# Patient Record
Sex: Male | Born: 1940 | Race: White | Hispanic: No | Marital: Married | Smoking: Never smoker
Health system: Southern US, Community
[De-identification: ages and names within clinical notes are randomized; demographics above are authoritative.]

## PROBLEM LIST (undated history)

## (undated) DIAGNOSIS — I2510 Atherosclerotic heart disease of native coronary artery without angina pectoris: Secondary | ICD-10-CM

## (undated) DIAGNOSIS — I499 Cardiac arrhythmia, unspecified: Secondary | ICD-10-CM

## (undated) DIAGNOSIS — J986 Disorders of diaphragm: Secondary | ICD-10-CM

## (undated) DIAGNOSIS — E328 Other diseases of thymus: Secondary | ICD-10-CM

## (undated) DIAGNOSIS — I4891 Unspecified atrial fibrillation: Secondary | ICD-10-CM

## (undated) DIAGNOSIS — G709 Myoneural disorder, unspecified: Secondary | ICD-10-CM

## (undated) DIAGNOSIS — C61 Malignant neoplasm of prostate: Secondary | ICD-10-CM

## (undated) DIAGNOSIS — N5231 Erectile dysfunction following radical prostatectomy: Secondary | ICD-10-CM

## (undated) DIAGNOSIS — K5790 Diverticulosis of intestine, part unspecified, without perforation or abscess without bleeding: Secondary | ICD-10-CM

## (undated) DIAGNOSIS — IMO0001 Reserved for inherently not codable concepts without codable children: Secondary | ICD-10-CM

## (undated) DIAGNOSIS — K627 Radiation proctitis: Secondary | ICD-10-CM

## (undated) DIAGNOSIS — K635 Polyp of colon: Secondary | ICD-10-CM

## (undated) DIAGNOSIS — M1990 Unspecified osteoarthritis, unspecified site: Secondary | ICD-10-CM

## (undated) DIAGNOSIS — J9612 Chronic respiratory failure with hypercapnia: Secondary | ICD-10-CM

## (undated) DIAGNOSIS — J9859 Other diseases of mediastinum, not elsewhere classified: Secondary | ICD-10-CM

## (undated) DIAGNOSIS — J3800 Paralysis of vocal cords and larynx, unspecified: Secondary | ICD-10-CM

## (undated) DIAGNOSIS — D696 Thrombocytopenia, unspecified: Secondary | ICD-10-CM

## (undated) DIAGNOSIS — IMO0002 Reserved for concepts with insufficient information to code with codable children: Secondary | ICD-10-CM

## (undated) DIAGNOSIS — R0600 Dyspnea, unspecified: Secondary | ICD-10-CM

## (undated) DIAGNOSIS — J9690 Respiratory failure, unspecified, unspecified whether with hypoxia or hypercapnia: Secondary | ICD-10-CM

## (undated) HISTORY — PX: ATRIAL ABLATION SURGERY: SHX560

## (undated) HISTORY — PX: OTHER SURGICAL HISTORY: SHX169

## (undated) HISTORY — DX: Unspecified atrial fibrillation: I48.91

## (undated) HISTORY — PX: POLYPECTOMY: SHX149

## (undated) HISTORY — DX: Radiation proctitis: K62.7

## (undated) HISTORY — DX: Other diseases of mediastinum, not elsewhere classified: J98.59

## (undated) HISTORY — PX: TONSILLECTOMY: SUR1361

## (undated) HISTORY — PX: BACK SURGERY: SHX140

## (undated) HISTORY — DX: Thrombocytopenia, unspecified: D69.6

## (undated) HISTORY — DX: Respiratory failure, unspecified, unspecified whether with hypoxia or hypercapnia: J96.90

## (undated) HISTORY — DX: Unspecified osteoarthritis, unspecified site: M19.90

## (undated) HISTORY — DX: Reserved for concepts with insufficient information to code with codable children: IMO0002

## (undated) HISTORY — DX: Other diseases of thymus: E32.8

## (undated) HISTORY — PX: COLONOSCOPY: SHX174

## (undated) HISTORY — PX: VASECTOMY: SHX75

## (undated) HISTORY — DX: Malignant neoplasm of prostate: C61

## (undated) HISTORY — PX: KNEE ARTHROSCOPY: SUR90

## (undated) HISTORY — PX: PROSTATE SURGERY: SHX751

## (undated) HISTORY — DX: Diverticulosis of intestine, part unspecified, without perforation or abscess without bleeding: K57.90

## (undated) HISTORY — DX: Paralysis of vocal cords and larynx, unspecified: J38.00

## (undated) HISTORY — DX: Myoneural disorder, unspecified: G70.9

## (undated) HISTORY — DX: Disorders of diaphragm: J98.6

## (undated) HISTORY — DX: Polyp of colon: K63.5

## (undated) HISTORY — DX: Chronic respiratory failure with hypercapnia: J96.12

## (undated) HISTORY — DX: Atherosclerotic heart disease of native coronary artery without angina pectoris: I25.10

## (undated) HISTORY — DX: Reserved for inherently not codable concepts without codable children: IMO0001

## (undated) HISTORY — DX: Erectile dysfunction following radical prostatectomy: N52.31

---

## 2001-09-27 ENCOUNTER — Encounter: Payer: Self-pay | Admitting: Orthopedic Surgery

## 2001-09-27 ENCOUNTER — Encounter: Admission: RE | Admit: 2001-09-27 | Discharge: 2001-09-27 | Payer: Self-pay | Admitting: Orthopedic Surgery

## 2001-10-11 ENCOUNTER — Encounter: Payer: Self-pay | Admitting: Orthopedic Surgery

## 2001-10-11 ENCOUNTER — Encounter: Admission: RE | Admit: 2001-10-11 | Discharge: 2001-10-11 | Payer: Self-pay | Admitting: Orthopedic Surgery

## 2001-10-25 ENCOUNTER — Encounter: Admission: RE | Admit: 2001-10-25 | Discharge: 2001-10-25 | Payer: Self-pay | Admitting: Orthopedic Surgery

## 2001-10-25 ENCOUNTER — Encounter: Payer: Self-pay | Admitting: Orthopedic Surgery

## 2001-11-12 ENCOUNTER — Encounter: Admission: RE | Admit: 2001-11-12 | Discharge: 2001-11-12 | Payer: Self-pay | Admitting: Orthopedic Surgery

## 2001-11-12 ENCOUNTER — Encounter: Payer: Self-pay | Admitting: Orthopedic Surgery

## 2002-04-17 ENCOUNTER — Encounter: Payer: Self-pay | Admitting: Orthopedic Surgery

## 2002-04-17 ENCOUNTER — Encounter: Admission: RE | Admit: 2002-04-17 | Discharge: 2002-04-17 | Payer: Self-pay | Admitting: Orthopedic Surgery

## 2002-06-20 ENCOUNTER — Encounter: Admission: RE | Admit: 2002-06-20 | Discharge: 2002-06-20 | Payer: Self-pay | Admitting: Orthopedic Surgery

## 2002-06-20 ENCOUNTER — Encounter: Payer: Self-pay | Admitting: Orthopedic Surgery

## 2002-07-16 ENCOUNTER — Encounter: Admission: RE | Admit: 2002-07-16 | Discharge: 2002-07-16 | Payer: Self-pay | Admitting: Orthopedic Surgery

## 2002-07-16 ENCOUNTER — Encounter: Payer: Self-pay | Admitting: Orthopedic Surgery

## 2002-09-10 ENCOUNTER — Ambulatory Visit (HOSPITAL_BASED_OUTPATIENT_CLINIC_OR_DEPARTMENT_OTHER): Admission: RE | Admit: 2002-09-10 | Discharge: 2002-09-10 | Payer: Self-pay | Admitting: Orthopaedic Surgery

## 2003-05-28 ENCOUNTER — Encounter: Payer: Self-pay | Admitting: Orthopedic Surgery

## 2003-05-28 ENCOUNTER — Encounter: Admission: RE | Admit: 2003-05-28 | Discharge: 2003-05-28 | Payer: Self-pay | Admitting: Orthopedic Surgery

## 2003-07-02 ENCOUNTER — Encounter: Payer: Self-pay | Admitting: Orthopedic Surgery

## 2003-07-02 ENCOUNTER — Encounter: Admission: RE | Admit: 2003-07-02 | Discharge: 2003-07-02 | Payer: Self-pay | Admitting: Orthopedic Surgery

## 2003-07-29 ENCOUNTER — Encounter: Admission: RE | Admit: 2003-07-29 | Discharge: 2003-07-29 | Payer: Self-pay | Admitting: Orthopedic Surgery

## 2004-02-05 ENCOUNTER — Encounter: Admission: RE | Admit: 2004-02-05 | Discharge: 2004-02-05 | Payer: Self-pay | Admitting: Neurosurgery

## 2004-05-10 ENCOUNTER — Inpatient Hospital Stay (HOSPITAL_COMMUNITY): Admission: AD | Admit: 2004-05-10 | Discharge: 2004-05-11 | Payer: Self-pay | Admitting: *Deleted

## 2004-05-11 ENCOUNTER — Encounter: Payer: Self-pay | Admitting: Cardiology

## 2004-08-17 ENCOUNTER — Ambulatory Visit: Payer: Self-pay | Admitting: *Deleted

## 2004-08-20 ENCOUNTER — Ambulatory Visit: Payer: Self-pay

## 2004-08-27 ENCOUNTER — Ambulatory Visit (HOSPITAL_COMMUNITY): Admission: RE | Admit: 2004-08-27 | Discharge: 2004-08-27 | Payer: Self-pay | Admitting: Neurosurgery

## 2005-03-02 ENCOUNTER — Ambulatory Visit: Payer: Self-pay | Admitting: *Deleted

## 2005-04-08 ENCOUNTER — Ambulatory Visit: Payer: Self-pay | Admitting: Internal Medicine

## 2005-07-09 ENCOUNTER — Inpatient Hospital Stay (HOSPITAL_COMMUNITY): Admission: AD | Admit: 2005-07-09 | Discharge: 2005-07-13 | Payer: Self-pay | Admitting: Urology

## 2005-07-13 ENCOUNTER — Ambulatory Visit: Payer: Self-pay | Admitting: Cardiology

## 2005-08-10 ENCOUNTER — Ambulatory Visit: Payer: Self-pay | Admitting: Internal Medicine

## 2005-09-27 ENCOUNTER — Encounter (INDEPENDENT_AMBULATORY_CARE_PROVIDER_SITE_OTHER): Payer: Self-pay | Admitting: Specialist

## 2005-09-27 ENCOUNTER — Inpatient Hospital Stay (HOSPITAL_COMMUNITY): Admission: RE | Admit: 2005-09-27 | Discharge: 2005-09-29 | Payer: Self-pay | Admitting: Urology

## 2006-05-12 ENCOUNTER — Ambulatory Visit: Payer: Self-pay | Admitting: Internal Medicine

## 2006-05-16 ENCOUNTER — Ambulatory Visit: Payer: Self-pay | Admitting: Cardiovascular Disease

## 2006-05-30 ENCOUNTER — Ambulatory Visit: Payer: Self-pay | Admitting: Cardiology

## 2006-06-13 ENCOUNTER — Ambulatory Visit: Payer: Self-pay | Admitting: *Deleted

## 2006-07-13 ENCOUNTER — Ambulatory Visit: Payer: Self-pay | Admitting: *Deleted

## 2006-07-13 ENCOUNTER — Ambulatory Visit: Payer: Self-pay | Admitting: Internal Medicine

## 2006-08-07 ENCOUNTER — Ambulatory Visit: Payer: Self-pay | Admitting: Internal Medicine

## 2006-08-07 LAB — CONVERTED CEMR LAB
BUN: 18 mg/dL (ref 6–23)
CO2: 30 meq/L (ref 19–32)
Calcium: 9.6 mg/dL (ref 8.4–10.5)
Chloride: 106 meq/L (ref 96–112)
Creatinine, Ser: 1 mg/dL (ref 0.4–1.5)
GFR calc non Af Amer: 80 mL/min
Glomerular Filtration Rate, Af Am: 97 mL/min/{1.73_m2}
Glucose, Bld: 88 mg/dL (ref 70–99)
HCT: 43.7 % (ref 39.0–52.0)
Hemoglobin: 14.8 g/dL (ref 13.0–17.0)
INR: 1.9 (ref 0.9–2.0)
MCHC: 33.8 g/dL (ref 30.0–36.0)
MCV: 90.2 fL (ref 78.0–100.0)
Platelets: 177 10*3/uL (ref 150–400)
Potassium: 4.4 meq/L (ref 3.5–5.1)
Prothrombin Time: 17.2 s — ABNORMAL HIGH (ref 10.0–14.0)
RBC: 4.85 M/uL (ref 4.22–5.81)
RDW: 12.9 % (ref 11.5–14.6)
Sodium: 142 meq/L (ref 135–145)
WBC: 6.6 10*3/uL (ref 4.5–10.5)
aPTT: 38.4 s — ABNORMAL HIGH (ref 26.5–36.5)

## 2006-08-10 ENCOUNTER — Ambulatory Visit: Payer: Self-pay | Admitting: Internal Medicine

## 2006-08-17 ENCOUNTER — Ambulatory Visit: Payer: Self-pay | Admitting: Cardiovascular Disease

## 2006-08-25 ENCOUNTER — Ambulatory Visit: Payer: Self-pay | Admitting: Internal Medicine

## 2006-08-31 ENCOUNTER — Ambulatory Visit: Payer: Self-pay | Admitting: Cardiology

## 2006-09-08 ENCOUNTER — Ambulatory Visit (HOSPITAL_COMMUNITY): Admission: RE | Admit: 2006-09-08 | Discharge: 2006-09-08 | Payer: Self-pay | Admitting: Internal Medicine

## 2006-09-08 ENCOUNTER — Ambulatory Visit: Payer: Self-pay | Admitting: Internal Medicine

## 2006-09-14 ENCOUNTER — Ambulatory Visit: Payer: Self-pay | Admitting: Internal Medicine

## 2006-09-14 ENCOUNTER — Ambulatory Visit (HOSPITAL_COMMUNITY): Admission: RE | Admit: 2006-09-14 | Discharge: 2006-09-14 | Payer: Self-pay | Admitting: Internal Medicine

## 2006-09-15 ENCOUNTER — Ambulatory Visit: Payer: Self-pay | Admitting: Internal Medicine

## 2006-09-15 ENCOUNTER — Ambulatory Visit: Payer: Self-pay

## 2006-10-02 ENCOUNTER — Ambulatory Visit: Payer: Self-pay | Admitting: Cardiology

## 2006-10-06 ENCOUNTER — Ambulatory Visit: Payer: Self-pay | Admitting: Cardiology

## 2006-10-06 ENCOUNTER — Ambulatory Visit (HOSPITAL_COMMUNITY): Admission: RE | Admit: 2006-10-06 | Discharge: 2006-10-06 | Payer: Self-pay | Admitting: Internal Medicine

## 2006-10-12 ENCOUNTER — Ambulatory Visit: Payer: Self-pay | Admitting: Cardiology

## 2006-10-12 ENCOUNTER — Ambulatory Visit: Payer: Self-pay

## 2006-10-27 ENCOUNTER — Ambulatory Visit: Payer: Self-pay | Admitting: Cardiology

## 2006-11-10 ENCOUNTER — Ambulatory Visit: Payer: Self-pay | Admitting: Internal Medicine

## 2006-11-28 ENCOUNTER — Ambulatory Visit: Payer: Self-pay | Admitting: Cardiology

## 2006-12-13 ENCOUNTER — Ambulatory Visit: Payer: Self-pay | Admitting: Cardiovascular Disease

## 2007-02-07 ENCOUNTER — Ambulatory Visit: Payer: Self-pay | Admitting: Cardiology

## 2007-02-26 ENCOUNTER — Ambulatory Visit: Payer: Self-pay | Admitting: Cardiology

## 2007-02-26 LAB — CONVERTED CEMR LAB
INR: 1.7 (ref 0.9–2.0)
Prothrombin Time: 16 s — ABNORMAL HIGH (ref 10.0–14.0)

## 2007-03-02 ENCOUNTER — Ambulatory Visit: Payer: Self-pay | Admitting: Cardiology

## 2007-03-22 ENCOUNTER — Ambulatory Visit: Payer: Self-pay | Admitting: Internal Medicine

## 2007-04-20 ENCOUNTER — Ambulatory Visit: Payer: Self-pay | Admitting: Cardiology

## 2007-05-04 ENCOUNTER — Ambulatory Visit: Payer: Self-pay | Admitting: Cardiology

## 2007-05-17 ENCOUNTER — Ambulatory Visit: Payer: Self-pay | Admitting: Internal Medicine

## 2007-05-18 ENCOUNTER — Ambulatory Visit: Payer: Self-pay | Admitting: Cardiothoracic Surgery

## 2007-06-08 ENCOUNTER — Ambulatory Visit: Payer: Self-pay | Admitting: Cardiology

## 2007-07-11 ENCOUNTER — Ambulatory Visit: Payer: Self-pay | Admitting: Cardiology

## 2007-08-17 ENCOUNTER — Ambulatory Visit: Payer: Self-pay | Admitting: Cardiology

## 2007-09-13 HISTORY — PX: ROBOT ASSISTED LAPAROSCOPIC RADICAL PROSTATECTOMY: SHX5141

## 2007-09-14 ENCOUNTER — Ambulatory Visit: Payer: Self-pay | Admitting: Cardiovascular Disease

## 2007-10-12 ENCOUNTER — Ambulatory Visit: Payer: Self-pay | Admitting: Cardiology

## 2008-10-17 ENCOUNTER — Ambulatory Visit: Admission: RE | Admit: 2008-10-17 | Discharge: 2008-11-07 | Payer: Self-pay | Admitting: Radiation Oncology

## 2009-06-04 ENCOUNTER — Encounter (INDEPENDENT_AMBULATORY_CARE_PROVIDER_SITE_OTHER): Payer: Self-pay | Admitting: *Deleted

## 2009-09-24 ENCOUNTER — Encounter (INDEPENDENT_AMBULATORY_CARE_PROVIDER_SITE_OTHER): Payer: Self-pay | Admitting: *Deleted

## 2009-09-28 ENCOUNTER — Ambulatory Visit: Payer: Self-pay | Admitting: Internal Medicine

## 2009-10-07 ENCOUNTER — Ambulatory Visit: Payer: Self-pay | Admitting: Internal Medicine

## 2009-10-09 ENCOUNTER — Encounter: Payer: Self-pay | Admitting: Internal Medicine

## 2009-12-24 ENCOUNTER — Ambulatory Visit: Admission: RE | Admit: 2009-12-24 | Discharge: 2010-03-24 | Payer: Self-pay | Admitting: Radiation Oncology

## 2010-03-01 ENCOUNTER — Encounter: Admission: RE | Admit: 2010-03-01 | Discharge: 2010-03-01 | Payer: Self-pay | Admitting: Neurosurgery

## 2010-03-25 ENCOUNTER — Ambulatory Visit: Admission: RE | Admit: 2010-03-25 | Discharge: 2010-04-05 | Payer: Self-pay | Admitting: Radiation Oncology

## 2010-04-10 ENCOUNTER — Encounter: Payer: Self-pay | Admitting: Internal Medicine

## 2010-05-04 ENCOUNTER — Ambulatory Visit (HOSPITAL_COMMUNITY): Admission: RE | Admit: 2010-05-04 | Discharge: 2010-05-04 | Payer: Self-pay | Admitting: Family Medicine

## 2010-05-04 ENCOUNTER — Ambulatory Visit: Payer: Self-pay | Admitting: Vascular Surgery

## 2010-10-14 NOTE — Letter (Signed)
[facility]   Imported By: Roderic Ovens 05/28/2010 10:18:06  _____________________________________________________________________  External Attachment:    Type:   Image     Comment:   External Document

## 2010-10-14 NOTE — Procedures (Signed)
Summary: Colonoscopy  Patient: [name] Note: All result statuses are Final unless otherwise noted.  Tests: (1) Colonoscopy (COL)   COL Colonoscopy           DONE     [facility]     [address]     [place], [place]  [address]           COLONOSCOPY PROCEDURE REPORT           PATIENT:  [name] [name], [name] [name]  MR#:  [id number]     BIRTHDATE:  05/29/1941, 68 yrs. old  GENDER:  male           ENDOSCOPIST:  Wilhemina Bonito. Eda Keys, MD     Referred by:  Surveillance Program Recall           PROCEDURE DATE:  10/07/2009     PROCEDURE:  Colonoscopy with snare polypectomy     ASA CLASS:  Class II     INDICATIONS:  history of polyps (index exam 06-2004 w/     hyperplastic polyp)           MEDICATIONS:   Fentanyl 50 mcg IV, Versed 6 mg IV           DESCRIPTION OF PROCEDURE:   After the risks benefits and     alternatives of the procedure were thoroughly explained, informed     consent was obtained.  Digital rectal exam was performed and     revealed no abnormalities.   The LB CF-[id number] endoscope     was introduced through the anus and advanced to the cecum, which     was identified by both the appendix and ileocecal valve, without     limitations.Time to cecum = 2:52 min. The quality of the prep was     excellent, using MoviPrep.  The instrument was then slowly     withdrawn (time = 15:40 min) as the colon was fully examined.     <<PROCEDUREIMAGES>>           FINDINGS:  Three polyps measuring 2mm each were found ascending     colon (2) and sigmoid colon. Polyps were snared without cautery.     Retrieval was successful.  This was otherwise a normal examination     of the colon.   Retroflexed views in the rectum revealed internal     hemorrhoids.    The scope was then withdrawn from the patient and     the procedure completed.           COMPLICATIONS:  None           ENDOSCOPIC IMPRESSION:     1) Three polyps - removed     2) Otherwise normal examination     3) Internal  hemorrhoids           RECOMMENDATIONS:     1) Repeat colonoscopy in 5 years if polyp adenomatous; otherwise     10 years           ______________________________     Wilhemina Bonito. Eda Keys, MD           CC:  Robert Bellow, MD; The Patient           n.     eSIGNED:   Wilhemina Bonito. Eda Keys at 10/07/2009 02:14 PM           Marlin Canary, [phone]  Note: An exclamation mark (!) indicates a result that was not dispersed into  the flowsheet. Document Creation Date: 10/07/2009 2:14 PM _______________________________________________________________________  (1) Order result status: Final Collection or observation date-time: 10/07/2009 14:05 Requested date-time:  Receipt date-time:  Reported date-time:  Referring Physician:   Ordering Physician: Fransico Setters [phone]) Specimen Source:  Source: Launa Grill Order Number: [id number] Lab site:   Appended Document: Colonoscopy     Procedures Next Due Date:    Colonoscopy: 10/2014

## 2010-10-14 NOTE — Letter (Signed)
Summary: [facility] Instructions  [facility]  [address] [place], [place] [address]   Phone: [phone]  Fax: [phone]       [name] [name]    Mar 13, 1941    MRN: [id number]        Procedure Day Dorna Bloom:  Wednesday 10/07/2009     Arrival Time:  12:30 pm     Procedure Time: 1:30 pm     Location of Procedure:                    _x _  [facility] (4th Floor)   PREPARATION FOR COLONOSCOPY WITH MOVIPREP   Starting 5 days prior to your procedure Friday 1/21 do not eat nuts, seeds, popcorn, corn, beans, peas,  salads, or any raw vegetables.  Do not take any fiber supplements (e.g. Metamucil, Citrucel, and Benefiber).  THE DAY BEFORE YOUR PROCEDURE         DATE: Tuesday 1/25  1.  Drink clear liquids the entire day-NO SOLID FOOD  2.  Do not drink anything colored red or purple.  Avoid juices with pulp.  No orange juice.  3.  Drink at least 64 oz. (8 glasses) of fluid/clear liquids during the day to prevent dehydration and help the prep work efficiently.  CLEAR LIQUIDS INCLUDE: Water Jello Ice Popsicles Tea (sugar ok, no milk/cream) Powdered fruit flavored drinks Coffee (sugar ok, no milk/cream) Gatorade Juice: apple, white grape, white cranberry  Lemonade Clear bullion, consomm, broth Carbonated beverages (any kind) Strained chicken noodle soup Hard Candy                             4.  In the morning, mix first dose of MoviPrep solution:    Empty 1 Pouch A and 1 Pouch B into the disposable container    Add lukewarm drinking water to the top line of the container. Mix to dissolve    Refrigerate (mixed solution should be used within 24 hrs)  5.  Begin drinking the prep at 5:00 p.m. The MoviPrep container is divided by 4 marks.   Every 15 minutes drink the solution down to the next mark (approximately 8 oz) until the full liter is complete.   6.  Follow completed prep with 16 oz of clear liquid of your choice (Nothing red or purple).   Continue to drink clear liquids until bedtime.  7.  Before going to bed, mix second dose of MoviPrep solution:    Empty 1 Pouch A and 1 Pouch B into the disposable container    Add lukewarm drinking water to the top line of the container. Mix to dissolve    Refrigerate  THE DAY OF YOUR PROCEDURE      DATE: Wednesday 1/26  Beginning at 8:30 a.m. (5 hours before procedure):         1. Every 15 minutes, drink the solution down to the next mark (approx 8 oz) until the full liter is complete.  2. Follow completed prep with 16 oz. of clear liquid of your choice.    3. You may drink clear liquids until 11:30 am (2 HOURS BEFORE PROCEDURE).   MEDICATION INSTRUCTIONS  Unless otherwise instructed, you should take regular prescription medications with a small sip of water   as early as possible the morning of your procedure.           OTHER INSTRUCTIONS  You will need a responsible adult  at least 69 years of age to accompany you and drive you home.   This person must remain in the waiting room during your procedure.  Wear loose fitting clothing that is easily removed.  Leave jewelry and other valuables at home.  However, you may wish to bring a book to read or  an iPod/MP3 player to listen to music as you wait for your procedure to start.  Remove all body piercing jewelry and leave at home.  Total time from sign-in until discharge is approximately 2-3 hours.  You should go home directly after your procedure and rest.  You can resume normal activities the  day after your procedure.  The day of your procedure you should not:   Drive   Make legal decisions   Operate machinery   Drink alcohol   Return to work  You will receive specific instructions about eating, activities and medications before you leave.    The above instructions have been reviewed and explained to me by   Ezra Sites RN  September 28, 2009 1:34 PM     I fully understand and can verbalize these  instructions _____________________________ Date _________

## 2010-10-14 NOTE — Miscellaneous (Signed)
Summary: [facility]  Clinical Lists Changes  Medications: Added new medication of MOVIPREP 100 GM  SOLR (PEG-KCL-NACL-NASULF-NA ASC-C) As per prep instructions. - Signed Rx of MOVIPREP 100 GM  SOLR (PEG-KCL-NACL-NASULF-NA ASC-C) As per prep instructions.;  #1 x 0;  Signed;  Entered by: Ezra Sites RN;  Authorized by: Hilarie Fredrickson MD;  Method used: [facility] to [facility] [address] [phone]*, [address], [place], [place], [place]  [address], Ph: [phone] or [phone], Fax: [phone] Observations: Added new observation of NKA: T (09/28/2009 13:16)    Prescriptions: MOVIPREP 100 GM  SOLR (PEG-KCL-NACL-NASULF-NA ASC-C) As per prep instructions.  #1 x 0   Entered by:   Ezra Sites RN   Authorized by:   Hilarie Fredrickson MD   Signed by:   Ezra Sites RN on 09/28/2009   Method used:   [facility]ronically to        [facility] [address] [phone]* (retail)       [address]       [place], [place]  [address]       Ph: [phone] or [phone]       Fax: [phone]   RxID:   [id number]

## 2010-10-14 NOTE — Letter (Signed)
Summary: Patient Notice- Polyp Results  [facility]  [address] [place], [place] [address]   Phone: [phone]  Fax: [phone]        October 09, 2009 MRN: [id number]    KENWOOD ROSIAK [address] 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  I recommend you have a repeat colonoscopy examination in 5 years to look for recurrent polyps, as having colon polyps increases your risk for having recurrent polyps or even colon cancer in the future.  Should you develop new or worsening symptoms of abdominal pain, bowel habit changes or bleeding from the rectum or bowels, please schedule an evaluation with either your primary care physician or with me.  Additional information/recommendations:  __ No further action with gastroenterology is needed at this time. Please      follow-up with your primary care physician for your other healthcare      needs.   Please call us if you are having persistent problems or have questions about your condition that have not been fully answered at this time.  Sincerely,  Wilhemina Bonito. Eda Keys MD Division of Gastroenterology  HealthCare  This letter has been electronically signed by your physician.  Appended Document: Patient Notice- Polyp Results letter mailed 2.2.11

## 2011-01-25 NOTE — Assessment & Plan Note (Signed)
[facility]                         ELECTROPHYSIOLOGY OFFICE NOTE   NAME:[name], [name] DR.                      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  It has not been associated with any  irregular heartbeats.   He tried to decrease his flecainide a couple of weeks ago from 150 to  100 mg twice daily.  Again, he had some kind of vague sensation.  It is  not quite clear that it is the same one as this one.   PHYSICAL EXAMINATION:  On examination today, he had tenderness in his  intercostal space about the area of the fifth or sixth rib.  This  reproduced a discomfort that he has been having.  His blood pressure is notable to be 111/69.  His pulse was 58.   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   PLAN:  We will plan to see him as previously scheduled.     Duke Salvia, MD, [facility]  Electronically Signed    SCK/MedQ  DD: 05/17/2007  DT: 05/18/2007  Job #: [id number]   cc:   [name] [name], M.D.

## 2011-01-28 NOTE — Letter (Signed)
July 13, 2006    Jonita Albee, M.D.  [facility]  [address]  [place], [place] [address]   RE:  AINSLEY, SANGUINETTI  MRN:  [id number]  /  DOB:  Feb 23, 1941   Dear Thayer Ohm,   I see that you are going to be a grandfather soon.  I hope this letter finds  you well.   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  We discussed what to do about his  atrial fibrillation.  His INRs have been getting into the therapeutic range.  After a long discussion, we decided that we would plan to proceed with DC  cardioversion with initiation of a IC antiarrhythmic drug in anticipation of  this.  We discussed and reviewed the issue of RF catheter ablation and for  right now, given his paucity of symptoms, we will hold off on moving down  that road.   Thayer Ohm, again I hope this letter finds you well.    Fondly,     ______________________________  Duke Salvia, MD, [facility]    SCK/MedQ  DD: 07/13/2006  DT: 07/14/2006  Job #: [id number]

## 2011-01-28 NOTE — Procedures (Signed)
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  He went out for a walk today and was felt to be quite  limited in his activity.   He went through a modification of a Bruce protocol achieving only 30  seconds into stage III.  Exercise was terminated because of fatigue.  His maximum heart rate was 105 which represented a deficit of 40 beats  per minute below his predicted 90% maximal heart rate.  In addition, it  was noted that his blood pressure was at 117 initially.  Stage I, it was  111; stage II was 107, and immediately post exercise, it was 95 with an  abrupt rebound to the 130 range.   IMPRESSION:  1. Chronotropic incompetence.  2. Abnormal blood pressure response.   [name] has a nonischemic Myoview, although there was some mild  depression of LV function.   IMPRESSION:  1. Chronotropic incompetence.  2. Exercise intolerance.  3. Hypotensive response to exercise.   We will need to reassess the above with a repeat treadmill in about 3  weeks.  He is to let us know how he is doing next week.  In the event  that he has recurrent evidence of hypotension with exercise, we will  need to think about stopping his Coumadin and doing a catheterization I  think.     Duke Salvia, MD, [facility]  Electronically Signed    SCK/MedQ  DD: 09/15/2006  DT: 09/15/2006  Job #: [id number]

## 2011-01-28 NOTE — H&P (Signed)
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 He felt fine  today until this evening after the [employer] game. He  developed some shaking chills, myalgias, and a mild headache. No pain except  slight right lower quadrant soreness. His urine originally was clear. Is now  a little bit bloody. He has had no diarrhea but did have a normal bowel  movement earlier this morning. He had a biopsy in my office yesterday. He  took only 1 ciprofloxacin the morning of the biopsy. Did not take it the day  before as was originally outlined to him. We decided to go ahead with the  biopsy anyway, as it would have been a month or more until he could  reschedule the procedure. Again, earlier today though, he was feeling fine  and it was just late this evening when he felt like he had the flu. He has  never had a previous urinary tract infection. 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 Gets up several times  a night.   PAST MEDICAL HISTORY:  He takes no regular medication but does have a pill  that he takes for his intermittent atrial fibrillation per Dr. Berton Mount.   ALLERGIES:  No known drug allergies.   PAST SURGICAL HISTORY:  He had back surgery in 2005. Knee surgery 2004. A  vasectomy in 1990.   CURRENT MEDICATIONS:  He did take some Advil about 2 hours ago.   SOCIAL HISTORY:  He has 2 children, 95 and 26 and 1 grandchild. His first  wife died. He is remarried. He does not smoke or drink  alcohol.   FAMILY HISTORY:  His father may have had prostate cancer but he is not sure.  He had a TURP in his 47's and died at age 95. His mother died at age 81. He  has no family history of other heart disease, kidney stones, or kidney  disease.   ROS::  knee and back problems, atrial fib as above, all else neg   PHYSICAL EXAMINATION:  VITAL SIGNS:  Temperature 101.6. Pulse 103. Blood  pressure 110/66. Respirations 18.  GENERAL:  He is a pleasant, balding, white male. No acute distress.  HEENT:  Clear.  NECK:  Supple.  LUNGS:  Clear.  ABDOMEN:  Soft, benign, without tenderness. No masses. No rebound  tenderness.  GENITOURINARY:  Penis, normal circumcised. Adequate meatus. Distended normal  size testes. Epididymis is not tender. His prostate is really non-tender. No  bogginess or fluctuance noted. Very little tenderness. Seminal vesicle is  not felt.  EXTREMITIES:  Negative. No  edema. Good distal pulses. Intact sensation to  light touch.   IMPRESSION:  1.  Possible genitourinary sepsis, post TRUS and biopsy a little over 30      hours ago.  2.  Obstructive uropathy.  3.  Hematuria.  4.  Elevated PSA.  5.  History of intermittent atrial fibrillation, apparently cured by      medication.   RECOMMENDATIONS:  IV antibiotics, blood cultures, urine culture, and blood  panels. Will watch closely for signs of sepsis.      Courtney Paris, M.D.  Electronically Signed     HMK/MEDQ  D:  07/09/2005  T:  07/09/2005  Job:  [id number]

## 2011-01-28 NOTE — Discharge Summary (Signed)
NAME:  [name] [name], [name] [name]                           ACCOUNT NO.:  [id number]   MEDICAL RECORD NO.:  [id number]                   PATIENT TYPE:  INP   LOCATION:  2029                                 FACILITY:  [facility]   PHYSICIAN:  Cecil Cranker, M.D.             DATE OF BIRTH:  07/26/1941   DATE OF ADMISSION:  05/10/2004  DATE OF DISCHARGE:  05/11/2004                                 DISCHARGE SUMMARY   DISCHARGE DIAGNOSIS:  New onset atrial fibrillation   SECONDARY DIAGNOSIS:  Status post knee arthroscopy.   PROCEDURE:  The patient was digitalized both at Dr. Ernestene Mention office and at  [facility] and also started on Coumadin.  The patient  converted readily to sinus bradycardia at 2200 hours on May 10, 2004.  His Cardizem was then held and the patient was also started on Coumadin  May 10, 2004, and will continue with a Lovenox/Coumadin bridge until  Coumadin is therapeutic.  He will do this as an outpatient.   LABORATORY DATA:  Hemoglobin 13.8, hematocrit 39.9, PT 15.4, INR 1.3, TSH of  1.95.  Cardiac enzymes were negative.  Creatinine 0.9.  For follow-up, he  will present to the Coumadin clinic Thursday, May 13, 2004, at 9:30 and  Monday, May 24, 2004, at 1 o'clock.  He will see Dr. Corinda Gubler Monday,  May 24, 2004, at 1:45.   DISCHARGE MEDICATIONS:  1.  Digoxin 0.125 mg.  2.  Lovenox 85 mg q.12h.  3.  Coumadin 5 mg daily or as directed by Coumadin clinic.   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  Graceann Congress as an add-on to the office in regard to new onset atrial  fibrillation with rapid ventricular rate.  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  These are associated with  anxiety.  These may last 30 minutes or so.  He denies any other associated  symptoms such as syncope, presyncope, chest  pain.  While playing golf this  Saturday, he felt a little woozy.  A partner checked his pulse and found it  to be irregular and recommended that he follow-up with primary care  physician on Monday, May 10, 2004.  He saw Dr. Perrin Maltese that morning.  Electrocardiogram showed atrial fibrillation with ventricular rate  of 130.  He was admitted to [facility] by Dr. Graceann Congress after further evaluation.  He was continued on digoxin and converted  to sinus rhythm at about 10:30 p.m.  After conversion, his further hospital  course was unremarkable.  He goes home with Coumadin therapy and with  digoxin medication.      Maple Mirza, P.A.  Cecil Cranker, M.D.    GM/MEDQ  D:  05/11/2004  T:  05/12/2004  Job:  [id number]   cc:   Jonita Albee, M.D.  [facility]  [address]  [place]  [place] [address]  Fax: [phone]

## 2011-01-28 NOTE — Op Note (Signed)
NAME:  [name] [name], [name] [name]                 ACCOUNT NO.:  [id number]   MEDICAL RECORD NO.:  [id number]          PATIENT TYPE:  OIB   LOCATION:  3007                         FACILITY:  [facility]   PHYSICIAN:  Donalee Citrin, M.D.        DATE OF BIRTH:  08-Jan-1941   DATE OF PROCEDURE:  08/27/2004  DATE OF DISCHARGE:                                 OPERATIVE REPORT   PREOPERATIVE DIAGNOSIS:  Lumbar spinal stenosis, lateral recess stenosis,  with biforaminal stenosis of the L4 and L5 nerve roots on the right at L4-  L5.   POSTOPERATIVE DIAGNOSIS:  Lumbar spinal stenosis, lateral recess stenosis,  with biforaminal stenosis of the L4 and L5 nerve roots on the right at L4-  L5.   PROCEDURE:  Decompressive laminectomy on the right at L4-L5 with microscopic  decompression of the L4 and L5 nerve root.   SURGEON:  Donalee Citrin, M.D.   ASSISTANT:  Tia Alert, MD   ANESTHESIA:  General endotracheal anesthesia.   HISTORY OF PRESENT ILLNESS:  The patient is a very pleasant 70 year old  gentleman who has had long-standing back and right leg radiculopathy that  has been going on for several years and has gotten progressively worse over  the last several months.  The patient reports radiation down to the anterior  part of his shin to his ankle occasionally to his foot but rarely.  He  denies any left leg symptoms, bowel or bladder complaints.  Preoperative  imaging shows severe lumbar spinal stenosis with degenerative disease at 3-4  and 4-5 and severe degenerative collapse with lateral recess stenosis at L4-  L5 compressing both the 4 and 5 roots.  Due to the patient's failure of  conservative treatment with epidural steroid injections and physical  therapy, the patient was recommended decompressive laminectomy.  I discussed  the risks and benefits of the surgery with him, he understood and agreed to  proceed forth.   DESCRIPTION OF PROCEDURE:  The patient was brought to the operating room,  given general  anesthesia, positioned prone on the Wilson frame.  The back  was prepped and draped in the usual sterile fashion.  A preop x-ray  localized the L5 spinous process.  A midline incision was made centering  this and slightly superior.  Bovie electrocautery was used to dissect  through the subcutaneous tissue and subperiosteal dissection was carried out  at the lamina of L4 and L5 on the right.  Interoperative x-ray confirmed  localization of the L5 pedicle.  The inferior aspect of the lamina of L4,  medial aspect and facet complex and superior aspect of L5 was removed  exposing the ligamentum flavum.  The operating microscope was  brought onto  the field.  The thecal sac was identified, the ligamentum flavum was noted  to be markedly hypertrophied and there was noted to be significant amount of  degenerative facet compressing the L5 proximal aspect of the L5 nerve root  coming off the 4-5 facet.  This was causing hour glass deformation of the  thecal sac and using a #4  Penfield to dissect the ligament off the dura,  this was freed up and removed with a 4 mm Kerrison punch.  There was noted  to be marked compression coming from the superior aspect of the L5 vertebral  body on the thecal sac and this was also teased away with a #4 Penfield and  bitten off with a Kerrison punch.  At this point, at the junction just  superior to the 5 foot on the central dura, there was noted to be a marked  split thickness tear causing a small little bubble of arachnoid to be  visible.  There was no CSF appreciated here.  This was packed away with a  small paddy and then attention was taken further decompressing the 5 root at  the neural foramen.  Then, the interspace was identified.  The nerve root  retractors were used to reflect the L5 nerve root medially.  An annulotomy  was made, there was noted to be marked bulging of the lateral aspect of the  interspace underneath the facet complex.  This was all teased  away and  several large fragments of disc removed from the lateral compartment of the  disc space with a blunt nerve hook, coronary dilator, angled hockey stick,  and downgoing Epstein curet.  At the end of the discectomy, there was no  further stenosis either in the lateral compartment or the medial  compartment.  Then, attention was taken superiorly.  The laminotomy was  extended up to identify the 4 root.  The under surface of the 4 root was  appreciated and a hockey stick and coronary dilator was easily used to pass  the 4 up the neural foramen.  Then, attention was turned back to the  superior aspect of the interspace at the inferior endplate of 4, nerve root  retractor was used to reflect the thecal sac medially.  The under surface of  this was palpated and no further disc appreciated under the 4 root, the 4  was again tracked out its foramen.  The posterior aspect coming off the 4  vertebral body was bitten off with the osteophyte remover and at the end of  this decompression, both the 4 and the 5 roots were radically decompressed  easily, opened up in their foramen, and a hockey stick and coronary dilator  were easily passed out them.  Then, the area around this scleral tear was  dissected free, the remainder of the decompression was carried out  inferiorly and the 5 lamina to make sure this was not causing any further  stenosis.  Then, this was not leaking any apparent CSF.  A small piece of 1  by 1 Duragen Plus was laid over the top of the defect and then Tisseel was  overlaid on top of this.  Prior to this, the wound had been copiously  irrigated and meticulous hemostasis had been maintained and Gelfoam was  overlaid on top of the Tisseel and then the wound was closed with 0  interrupted Vicryl in the muscle and fascia, 2-0 interrupted Vicryl in the  subcutaneous tissue, and a running 4-0 subcuticular in the skin.  At the end of the case, the needle, sponge, and instrument counts  were correct.       GC/MEDQ  D:  08/27/2004  T:  08/27/2004  Job:  [id number]

## 2011-01-28 NOTE — H&P (Signed)
NAME:  [name] [name], [name] [name]                 ACCOUNT NO.:  [id number]   MEDICAL RECORD NO.:  [id number]          PATIENT TYPE:  INP   LOCATION:  NA                           FACILITY:  [facility]   PHYSICIAN:  Heloise Purpura, MD      DATE OF BIRTH:  January 03, 1941   DATE OF ADMISSION:  09/27/2005  DATE OF DISCHARGE:                                HISTORY & PHYSICAL   CHIEF COMPLAINT:  Prostate cancer.   HISTORY:  [name] [name] is a 70 year old patient who was found to have clinical  stage T2a prostate cancer with a nodule noted at the right base. His PSA was  noted to be 3.5. He subsequently underwent a prostate biopsy on July 08, 2005, and was found to have Gleason 3 + 4 = 7 adenocarcinoma in 2/5 cores on  the right including 20% in 1 core and 30% in another core. There were also  2/5 cores positive on the left side with 10% of each of these cores  involved. His AUA Symptom Score is 11 with an IIEF Score of 25. Of note, the  patient did develop post-biopsy prostatitis requiring hospitalization for 5  days. His final culture demonstrated a fluoroquinolone-resistant E. coli  that was sensitive to trimethoprim sulfamethoxazole. In addition, the  patient does have a history of paroxysmal atrial fibrillation which he did  develop while hospitalized. He was seen by Dr. Sherryl Manges, his  cardiologist. He was also evaluated by Dr. Graciela Husbands prior to consideration for  a radical prostatectomy. It was felt that he would be at low surgical risk  if placed on a preoperative beta-blocker. After discussing all management  options for clinically localized prostate cancer, the patient elected to  proceed with a robotic-assisted laparoscopic radical prostatectomy and  bilateral pelvic lymphadenectomy.   PAST MEDICAL HISTORY:  1.  Paroxysmal atrial fibrillation.  2.  BPH.   PAST SURGICAL HISTORY:  None.   MEDICATIONS:  The patient has intermittently taken Cardizem. He has also  been on Flomax as well as finasteride.  Currently, he has recently begun beta-  blocker therapy.   ALLERGIES:  NO KNOWN DRUG ALLERGIES.   SOCIAL HISTORY:  The patient works as an Associate Professor. He denies excessive  tobacco or alcohol use.   PHYSICAL EXAMINATION:  CONSTITUTIONAL:  The patient is a well-developed and  well-nourished, healthy-appearing, age-appropriate white male.  CARDIOVASCULAR:  Regular rate and rhythm.  LUNGS:  Clear bilaterally.  ABDOMEN:  Soft and nontender.  DIGITAL RECTAL EXAMINATION:  Nodularity at the right base of the prostate.  EXTREMITIES:  No edema.   IMPRESSION:  Clinically localized adenocarcinoma of the prostate.   PLAN:  [name] [name] will undergo a robotic-assisted laparoscopic radical  prostatectomy with bilateral pelvic lymphadenectomy. Postoperatively, he  will be admitted to the hospital for routine postoperative care.           ______________________________  Heloise Purpura, MD  Electronically Signed     LB/MEDQ  D:  09/27/2005  T:  09/27/2005  Job:  [id number]

## 2011-01-28 NOTE — Op Note (Signed)
NAME:  JAHSIR, RAMA NO.:  [id number]   MEDICAL RECORD NO.:  [id number]          PATIENT TYPE:  OIB   LOCATION:  2856                         FACILITY:  [facility]   PHYSICIAN:  Duke Salvia, MD, FACCDATE OF BIRTH:  1940-12-03   DATE OF PROCEDURE:  09/14/2006  DATE OF DISCHARGE:  09/14/2006                               OPERATIVE REPORT   PREOPERATIVE DIAGNOSIS:  Atrial fibrillation.   POSTOPERATIVE DIAGNOSIS:  Sinus rhythm.   PROCEDURE:  Direct-current cardioversion with verapamil pretreatment.   SURGEON:  Duke Salvia, MD, [facility]   DESCRIPTION OF PROCEDURE:  Following the obtaining of informed consent  and a failed cardioversion about a week ago, the patient was brought  into the hospital.  He was put on flecainide 100 mg twice daily in  anticipation, and he was also given verapamil 2.5 mg in anticipation of  the procedure.  A single 200-J shock was delivered synchronously into  atrial fibrillation, restoring sinus rhythm.   The patient tolerated the procedure without apparent complication.   The patient received a total of 140 mg of intravenous propofol per Dr.  Ivin Booty.      Duke Salvia, MD, [facility]  Electronically Signed     SCK/MEDQ  D:  09/14/2006  T:  09/14/2006  Job:  [id number]

## 2011-01-28 NOTE — Discharge Summary (Signed)
NAMEBANKS, CHAIKIN                 ACCOUNT NO.:  [id number]   MEDICAL RECORD NO.:  [id number]          PATIENT TYPE:  INP   LOCATION:  1406                         [facility]   PHYSICIAN:  Courtney Paris, M.D.DATE OF BIRTH:  [birth year]   DATE OF ADMISSION:  07/09/2005  DATE OF DISCHARGE:  07/13/2005                                 DISCHARGE SUMMARY   DISCHARGE DIAGNOSES:  1.  Gram negative sepsis.  2.  T2B Gleason 3+4 adenocarcinoma of the prostate.  3.  Intermittent atrial fibrillation.   No operations or procedures.   BRIEF HISTORY:  This 70 year old patient was admitted with presumed GU  sepsis one day status post prostate biopsy for elevated PSA and nodule.  He  felt well for 24 hours after the biopsy.  He did take Cipro just one time  before the morning of the biopsy.  Did not take it the day before, as  instructed.  He had some mild right lower quadrant soreness.  Urine was  originally clear but now became a little bit bloody on admission.  No  diarrhea.  No other symptoms for his fever.  He was admitted with 102 fever,  shaking chills, and 4/4 positive blood cultures were done.  His white count  was elevated at 13,500.  Urine culture was no growth, but the blood cultures  did grow out E. coli sensitive to everything but ampicillin and Cipro.  He  was maintained on IV gentamicin and Ancef, to which the germ was eventually  deemed to be sensitive.  Temperature was maximum of 100.6 in the last 24  hours, but the nearly 20 hours, was completely afebrile.  His white count  dropped to 7500 on the 30th, and his urine cleared up.  He is voiding well  but was improved on Flomax.  He did slip into atrial fibrillation but was  asymptomatic.  It was their feeling that he could maintain on this until he  could see them next week, as he was asymptomatic.   I also talked to him about his biopsy, which came back showing positive  cancer.  The nodule on the left did show 30%  positive biopsies with Gleason  3+4 and about 10% on the right, where there was no lesion seen on  ultrasound.  We talked about various treatment options, but he will make his  decision, and although it will be 6-8 weeks at least before any treatment  can be done because of this recent infection, at least he was presented the  options.   Metoprolol 25 mg p.o. b.i.d., Flomax 0.4 mg daily, and I decreased him from  one enteric aspirin to just a baby aspirin daily until he can see the  cardiologist next week.  He is instructed in light activity and will take  temperature three times a day and call me should he have any problems.  He  is sent home in improved ambulatory condition on a regular diet.      Courtney Paris, M.D.  Electronically Signed     HMK/MEDQ  D:  07/13/2005  T:  07/13/2005  Job:  [id number]

## 2011-01-28 NOTE — Op Note (Signed)
NAME:  [name] [name], [name] [name]                 ACCOUNT NO.:  [id number]   MEDICAL RECORD NO.:  [id number]          PATIENT TYPE:  INP   LOCATION:  X004                         FACILITY:  [facility]   PHYSICIAN:  Heloise Purpura, MD      DATE OF BIRTH:  April 29, 1941   DATE OF PROCEDURE:  09/27/2005  DATE OF DISCHARGE:                                 OPERATIVE REPORT   PREOPERATIVE DIAGNOSIS:  Clinically localized adenocarcinoma of the  prostate.   POSTOPERATIVE DIAGNOSIS:  Clinically localized adenocarcinoma of the  prostate.   PROCEDURE:  1.  Robotic assisted laparoscopic radical prostatectomy (bilateral nerve      sparing).  2.  Bilateral pelvic lymphadenectomy.   SURGEON:  Dr. Heloise Purpura.   ASSISTANT:  Dr. Glade Nurse.   ANESTHESIA:  General.   COMPLICATIONS:  None.   ESTIMATED BLOOD LOSS:  150 mL.   INTRAVENOUS FLUIDS:  2600 mL of lactated Ringer's.   SPECIMEN:  1.  Prostate and seminal vesicles.  2.  Right pelvic lymph nodes.  3.  Left pelvic lymph nodes.   DRAIN:  1.  18 French Coude' catheter.  2.  #19 Blake drain.   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 Preoperative AUA symptom score was 11 with an IIEF  score of 25. After discussing all management options for clinically  localized prostate cancer, the patient elected to proceed with the above  procedure. The potential risks and benefits were discussed with the patient  and he consented.   DESCRIPTION OF PROCEDURE:  The patient was taken to the operating room and a  general anesthetic was administered. The patient was given preoperative  antibiotics, placed in the dorsal lithotomy position, and prepped and draped  in the usual sterile fashion. Next a preoperative time-out was performed and  a Foley catheter was inserted into the bladder. A site was then selected 18  cm from the pubic  symphysis and just to the left of the umbilicus for  placement of the camera port. This was placed using a standard Hasson  technique which allowed entry into the peritoneal cavity under direct  vision. A 12 mm port was then placed and a pneumoperitoneum was established.  The zero degree lens was then used to inspect the abdomen and there was no  evidence of any intra-abdominal injuries or other abnormalities. The  remaining ports were then placed. Bilateral 8 mm robotic ports were placed  16 cm from the pubic symphysis and 10 cm lateral to the camera port. An  additional robotic port was placed in the far left lateral abdominal wall. A  5 mm port was placed between the camera port and the right robotic port. An  additional 12 mm port was placed in the far right lateral abdominal wall for  laparoscopic assistance. All ports were placed under direct vision and  without difficulty. The surgical cart was then docked. With the aid of the  hook cautery, the  bladder was reflected posteriorly allowing entry into the  space of Retzius and identification of the prostate and endopelvic fascia.  The endopelvic fascia was then incised from the apex back to the base of the  prostate bilaterally and the underlying levator muscle fibers were swept  laterally off the prostate. This isolated the dorsal venous complex which  was then stapled and divided with a 45 mm flex ETS stapler. Attention then  turned to the bladder neck. The anterior bladder neck was incised with the  hook cautery. The Foley catheter balloon was then deflated and Foley  catheter was brought into the operative field to retract the prostate  anteriorly. This exposed the posterior bladder neck nicely which was then  divided. Dissection continued posteriorly until the vasa deferentia and  seminal vesicles were identified. Each of these structures was isolated and  dissected free. These structures were then reflected anteriorly and the   space between the Denonvillier's fascia and the anterior rectum was bluntly  developed, thereby isolating the vascular pedicles of the prostate.  Attention then turned to the anterior aspect of the prostate. With scissors,  the lateral prostatic fascia was sharply incised bilaterally allowing the  neurovascular bundles to be swept laterally and posteriorly off the  prostate. The vascular pedicles of the prostate were then ligated with Hem-o-  Lok clips. At the base of the prostate, care was taken to preserve the  neurovascular bundles bilaterally. Attention then turned distally to the  urethra. The urethra was sharply divided allowing the prostate to be  disarticulated and placed up into the abdominal cavity. Attention then  returned to the pelvis. The pelvis was copiously irrigated and hemostasis  was ensured. With irrigation in the pelvis, air was injected into the rectal  catheter and there was no evidence of a rectal injury. Attention then turned  to the right side of the pelvis. The fibrofatty tissue between the external  iliac vein, confluence of the iliac vessels, obturator nerve, and Cooper's  ligament was dissected free from the pelvic sidewall with clips used for  lymphostasis and hemostasis. Care was taken to identify and preserve the  obturator nerve. An identical procedure was then performed on the  contralateral side. Both lymph node packets were removed for permanent  pathologic analysis. Attention then turned to the bladder neck. The bladder  neck was noted to be somewhat enlarged and therefore figure-of-eight 3-0  Vicryl sutures were placed at the 5 and 7 o'clock position of the bladder  neck to close the bladder neck slightly. A 2-0 Vicryl slip-knot was then  placed between the bladder neck and urethra at the 6 o'clock position and  used to reapproximate these structures. A double-armed 3-0 Monocryl suture was then used to perform a 360 degree running anastomosis between  the  bladder neck and urethra. A new 18-French coude' catheter was then inserted  into the bladder. This catheter was irrigated and the anastomosis appeared  to be watertight and tension-free. In addition, there were no blood clots  within the bladder. A #19 Blake drain was then passed through the left  robotic port and appropriately positioned in the pelvis. It was secured to  skin with a nylon suture. The surgical cart was then undocked. The prostate  was placed into the Endopouch retrieval bag via the camera port incision. A  #0 Vicryl stitch was placed through the abdominal wall fascia of the right  lateral 12 mm port site for port site closure. All remaining ports  were then  removed under direct vision. The camera port site was then slightly extended  inferiorly allowing the prostate specimen to be removed intact within the  Endopouch retrieval bag. This fascial opening was then closed with a running  #0 Vicryl suture. All port sites were then injected with 0.25% Marcaine. All  skin incisions were then closed and reapproximated with staples. Sterile  dressings were applied. The  patient appeared to tolerate the procedure well and without complications.  All sponge and needle counts were correct x2 at the end of the procedure.  The patient was able to be awakened and transferred to the recovery unit in  satisfactory condition.           ______________________________  Heloise Purpura, MD  Electronically Signed     LB/MEDQ  D:  09/27/2005  T:  09/27/2005  Job:  [id number]

## 2011-01-28 NOTE — Consult Note (Signed)
NAME:  [name] [name], [name] [name]                 ACCOUNT NO.:  [id number]   MEDICAL RECORD NO.:  [id number]          PATIENT TYPE:  INP   LOCATION:  1406                         FACILITY:  [facility]   PHYSICIAN:  [name] [name], M.D. 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  The patient has been seen by Dr. Glennon [name] previously as well as by Dr.  Graciela [name].  He has had a previous echocardiogram that showed normal LV function  by report as well as a negative nuclear study.  He also has had paroxysmal  atrial fibrillation in the past, treated with p.r.n. Toprol successfully.  He typically does not have dyspnea on exertion, orthopnea, PND, pedal edema,  presyncope, or chest pain.  He was admitted on October 28 p.m. with fevers,  chills, and myalgias, felt to be secondary to urosepsis.  It should be noted  that he had a prostate biopsy on October 27.  He has been  treated with  antibiotics with some improvement, although he is still having fevers.  He  developed atrial fibrillation today with a rapid ventricular response, and  cardiology is asked to evaluate.  He has had mild palpitations, but there is  no chest pain, shortness of breath, or presyncope.   CURRENT MEDICATIONS:  Include Flomax 0.4 mg p.o. daily, gentamicin and  Ancef.   He has no known drug allergies.   SOCIAL HISTORY:  He does not smoke and only rarely consumes alcohol.   FAMILY HISTORY:  Negative for coronary artery disease.   PAST MEDICAL HISTORY:  There is no diabetes mellitus, hypertension, or  hyperlipidemia.  He does have a history of paroxysmal atrial fibrillation,  as outlined in the HPI.  He has had back surgery as well  as knee surgery and  a vasectomy.  He had a prostate biopsy, as also outlined in the HPI.   REVIEW OF SYSTEMS:  He denies any headaches at present.  He has had fevers  and chills as well as myalgias.  There is no productive cough or hemoptysis.  There is no dysphagia, odynophagia, or hematochezia.  There has been dysuria  as well as hematuria related to his prostate biopsy.  There is no  claudication noted.  There is no orthopnea or PND.  The remaining systems  are negative.   PHYSICAL EXAMINATION:  VITAL SIGNS:  Blood pressure 120/60, pulse 137.  GENERAL:  He is well-developed, well-nourished, in mild distress from his  fevers and chills.  He does not appear to be depressed, and there is no  peripheral clubbing.  SKIN:  Mild diaphoresis.  HEENT:  Unremarkable with normal eyelids.  NECK:  Supple with normal jugular venous pressure bilaterally.  No bruits.  There is no jugular venous distention.  I cannot appreciate thyromegaly.  LUNGS:  Clear to auscultation in all aspects.  HEART:  Tachycardic rate and an irregular rhythm.  I cannot appreciate  murmurs, rubs or gallops.  ABDOMEN:  Nontender, nondistended.  Positive bowel sounds.  No  hepatosplenomegaly.  No mass appreciated.  There is no abdominal bruit.  He  has 2+ femoral pulses bilaterally and no bruits.  EXTREMITIES:  No edema.  No cords.  He has 2+ dorsalis pedis pulses  bilaterally.  NEUROLOGIC:  Grossly intact.   His electrocardiogram shows atrial fibrillation with a rapid ventricular  response.  There are nonspecific ST changes noted.   Other laboratories from this admission showed white blood cell count 7.5  with a hemoglobin of 13.3 and a hematocrit of 38.6.  His platelet count is  111.  His potassium on admission was low at 3.3.  His bilirubin was mildly  elevated at 2.  He had blood cultures that showed gram negative rods from  October 28.   DIAGNOSES:  1.  Paroxysmal atrial fibrillation.  2.  Urosepsis.  3.   Status post prostate biopsy with elevated prostate specific antigen.   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  His previous workup apparently has been  negative, including an echocardiogram that showed normal LV function and a  nuclear study that showed normal perfusion.  He has taken p.r.n. Toprol in  the past with success in converting this to normal sinus rhythm.  We will  therefore begin Lopressor 25 mg p.o. b.i.d.  If he can converts, then I  would continue the beta blockade throughout the hospitalization, as his  urosepsis will most likely drive the hyperadrenergic state and make his  recurrent atrial fibrillation more likely.  I do not think we will need to  anticoagulate at this point, as he has no embolic risk factors, including no  diabetes mellitus, hypertension, prior CVA, and no history of decreased LV  function.  He is also aged less than 65.  At discharge, he would benefit  from an aspirin long term.  If he does not convert, then we will plan to  anticoagulate tomorrow morning.  We will check a TSH.  We will continue to  follow while he is in the hospital.           ______________________________  [name] [name], M.D. [facility]     BC/MEDQ  D:  07/11/2005  T:  07/11/2005  Job:  [id number]

## 2011-01-28 NOTE — Op Note (Signed)
NAME:  [name] [name], [name] [name]                 ACCOUNT NO.:  [id number]   MEDICAL RECORD NO.:  [id number]          PATIENT TYPE:  OIB   LOCATION:  2853                         FACILITY:  [facility]   PHYSICIAN:  Madolyn Frieze. Jens Som, MD, FACCDATE OF BIRTH:  July 04, 1941   DATE OF PROCEDURE:  10/06/2006  DATE OF DISCHARGE:  10/06/2006                               OPERATIVE REPORT   PROCEDURE:  Cardioversion of atrial flutter.   The patient was sedated with Pentothal 300 mg intravenously.  Synchronized cardioversion with 120 joules (biphasic) resulted in sinus  bradycardia.  There were no immediate complications.  I have asked the  patient to follow up with Dr. Graciela Husbands and in the Coumadin Clinic as  scheduled.      Madolyn Frieze Jens Som, MD, [facility]  Electronically Signed     BSC/MEDQ  D:  10/06/2006  T:  10/07/2006  Job:  [id number]

## 2011-01-28 NOTE — Letter (Signed)
May 12, 2006     [name] Albee, MD  [facility]  [address]  [place], [place] [address]   RE:  [name] [name]  MRN:  [id number]  /  DOB:  23-Sep-1940   Dear [name] [name]:   I saw your patient, [name] [name], today at your recommendation because of  recurrence of his atrial fibrillation.  He has been paroxysmal in the past  and is now documented as persistent, having been abnormal when he saw you on  May 01, 2006, and still out of rhythm today.  He is occasionally aware of  palpitations.  His thromboembolic risk factors are currently negative.   A stress Myoview scan done in December 2005, demonstrated an ejection  fraction of 48% and occurred in the context of atrial fibrillation.  There  was no ischemia.  An echocardiogram done around that same time demonstrated  normal left ventricular function.   He is quite fit, walking three miles three or four days a week.   MEDICATIONS:  He currently takes no medications.   ALLERGIES:  No known drug allergies.   PHYSICAL EXAMINATION:  VITAL SIGNS:  Blood pressure 102/72, pulse 108.  HEENT:  Demonstrated no icterus.  NECK:  Veins were flat.  Carotids were brisk and full.  BACK:  Without kyphosis.  LUNGS:  Clear.  HEART:  Sounds were irregular with a very quiet murmur.  ABDOMEN:  Soft, active bowel sounds.  EXTREMITIES:  Without edema.   Electrocardiogram dated today demonstrated atrial fibrillation at 108 with  intervals of 0.08 to 0.35.   We submitted the patient to treadmill testing.  His heart rate control was  now surprising in that a stage II modified Bruce, i.e. each stage is one  minute long, and his heart rate had gotten up to 120-140, but had drifted  back into the mid-120's and at stage III got up to 150 and again drifted  back rather quickly to the 120's.   IMPRESSION:  1. Atrial fibrillation with an excessively fast rate at rest and a      moderately exuberant heart rate with exercise.  2. Thromboembolic  risk factors were negative.   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  Given his young age, I would  be in favor or trying to restore sinus rhythm, so that as the procedure of  atrial fibrillation ablation evolves, we have a better chance of being able  to have an effective procedure with long-term sinus rhythm.  To that end, I  suggested that we 1.  Augment re-control and 2.  Begin appropriate drug  therapy and 3.  Proceed with DC cardioversion.   At this point he raised the question about a study that he had heard about  from a friend that suggested that cardioversion was associated with early  Alzheimer's.  I told him that I was unaware of that date.  I would be glad  to talk to anybody about that and this may have significant impact on what  we would decide to do next.   In any case, today we started him on Coumadin, in anticipation of a  cardioversion.  I put him on Toprol 25 mg, to increase to 50 mg.  We are to  discuss the aforementioned issues regarding dementia and cardioversion, so  as to be able to make a decision about this and proceed.  We will plan  to  put him on a 1C antiarrhythmic drug prior to this.  I  also think we  probably need to repeat an assessment of his left ventricular function prior  to this to make sure that he does not have significant intercurrent  deterioration of his LVEF.   [name] [name], thank you very much for asking me to see him.  It has been a pleasure  seeing your daughter working around the hospital.   Sincerely,     [name] Salvia, MD, [facility]   SCK/MedQ  DD:  05/12/2006  DT:  05/12/2006  Job #:  [id number]

## 2011-01-28 NOTE — Discharge Summary (Signed)
[name], [name]                 ACCOUNT NO.:  [id number]   MEDICAL RECORD NO.:  [id number]          PATIENT TYPE:  INP   LOCATION:  1404                         FACILITY:  [facility]   PHYSICIAN:  Heloise Purpura, MD      DATE OF BIRTH:  Mar 20, 1941   DATE OF ADMISSION:  09/27/2005  DATE OF DISCHARGE:  09/29/2005                                 DISCHARGE SUMMARY   ADMISSION DIAGNOSIS:  Clinically localized adenocarcinoma of the prostate.   DISCHARGE DIAGNOSIS:  Clinically localized adenocarcinoma of the prostate.   PROCEDURES:  1.  Robotic-assisted laparoscopic radical prostatectomy.  2.  Bilateral pelvic lymphadenectomy.   HISTORY:  For full details, please see admission history and physical.  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 After a lengthy discussion  regarding various management options, the patient elected to proceed with  surgical removal of his prostate. Various surgical treatment options were  explained to the patient and he elected to proceed with the above  procedures.   HOSPITAL COURSE:  Following an uneventful robotic-assisted laparoscopic  radical prostatectomy, the patient was stable to be transferred to a regular  hospital room following recovery from anesthesia. Routine postoperative care  was performed. On postoperative day #1, the patient was able to begin a  clear liquid diet and able to ambulate without difficulty. He was noted to  be slightly hypotensive. However, his hemoglobin was checked serially and he  was noted to have no evidence of bleeding. He had been maintained on beta  blocker therapy and this was temporarily held until his blood pressure  stabilized. By postoperative day #2, his blood pressure had stabilized and  he was otherwise doing well. He had passed flatus and was ambulating without  difficulty. He had also tolerated clear liquids as well as oral pain  medication. His drain was therefore able to be removed and he was discharged  home in good condition.   DISPOSITION:  Home.   DISCHARGE INSTRUCTIONS:  The patient was instructed to be ambulatory.  However, he was specifically instructed to refrain from any heavy lifting,  strenuous activity, etc. He was instructed to gradually advance his diet  once he had passed flatus. He was also instructed on signs and symptoms of  wound infection and told to call should he have any problems. Finally, he  was also instructed on routine Foley catheter care as well as management of  a leg bag.   DISCHARGE MEDICATIONS:  The patient was instructed to resume his regular  home medications except for any aspirin, nonsteroidal anti-inflammatory  drugs, or herbal supplements for two weeks. He was given a prescription for  Vicodin to take as needed for pain. He was also instructed to take a stool  softener.   FOLLOW-UP APPOINTMENT:  An appointment was made for the patient to follow up  in 7-10 days to review his pathology, staple removal, and Foley catheter  removal.           ______________________________  Heloise Purpura, MD  Electronically Signed  LB/MEDQ  D:  10/05/2005  T:  10/06/2005  Job:  [id number]

## 2011-01-28 NOTE — Op Note (Signed)
NAME:  JAG, LENZ                           ACCOUNT NO.:  [id number]   MEDICAL RECORD NO.:  [id number]                   PATIENT TYPE:  AMB   LOCATION:  DSC                                  FACILITY:  [facility]   PHYSICIAN:  Claude Manges. Cleophas Dunker, M.D.            DATE OF BIRTH:  [birth year]   DATE OF PROCEDURE:  09/10/2002  DATE OF DISCHARGE:                                 OPERATIVE REPORT   PREOPERATIVE DIAGNOSES:  1. Tear of medial meniscus, left knee.  2. Tricompartmental degenerative changes.   POSTOPERATIVE DIAGNOSES:  1. Tear of medial meniscus, left knee.  2. Tricompartmental degenerative changes.   PROCEDURES:  1. Diagnostic arthroscopy, left knee.  2. Partial medial meniscectomy.  3. Shaving of medial compartment and patellofemoral joint.   SURGEON:  Claude Manges. Cleophas Dunker, M.D.   ANESTHESIA:  IV sedation and local 1% Xylocaine.   COMPLICATIONS:  None.   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  Pain was initiated in  midsummer while jogging and has persisted to the point of compromise.  Now  having quite a bit of medial joint pain and catching with an MRI scan  revealing a medial meniscal tear and grade 1 MCL sprain.  He also has  evidence of tricompartmental degenerative changes.  He is now to have an  arthroscopic evaluation.   DESCRIPTION OF PROCEDURE:  With the patient comfortable on the operating  table and under IV sedation, the left lower extremity was placed in a thigh  holder.  The leg was then prepped with Duraprep from the thigh holder to the  ankle.  Sterile draping was performed.  Prior to the patient coming to the  operating room, the joint had been infiltrated with 1% Xylocaine with  epinephrine.  He had an excellent block.   Two stab wounds were made on either side of the patellar tendon.  Diagnostic  arthroscopy revealed a very minimal effusion.  There was diffuse  chondromalacia of the patella in one area.  In the mid-facet there was an  area of exposed bone probably 5 mm in diameter.  There was surrounding  frayed articular cartilage.  The intra-articular shaver was introduced and  this area was then shaved to stable cartilage.  There were no loose bodies  in the superior pouch.  There was mild synovitis, which was debrided and  then stabilized with the ArthroCare wand.  I saw very little chondromalacia  of the trochlea.   The lateral compartment was relatively clear of degenerative change.  There  were a few areas of furrowing of the lateral femoral condyle, but they were  not loose or unstable and shaving was not necessary.  The lateral meniscus  was perfectly intact.   The intercondylar notch was somewhat crowded.  There were osteophytes  arising from the medial femoral  condyle.  There was probably a 6 or 7 mm  anterior drawer sign, and there appeared to be an old injury to the ACL from  its femoral attachment, as it was thin, but there was at least a partial  sheath around the ACL.   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  There were several flap tears.  These were debrided with the basket forceps  and the intra-articular shaver such that the remaining meniscus was  perfectly stable.  All of the loose pieces were removed.  There was also  considerable chondromalacia of the femoral condyle.  I did not see exposed  subchondral bone, but I felt that I was close and areas of loose articular  cartilage were then debrided.  Any small areas of bleeding were coagulated  with the ArthroCare wand.   The joint was nice and clear at this point.  It was irrigated with saline  solution, again inspected for any loose material, and there was none.   The two stab wounds were then left open and infiltrated with 0.25% Marcaine  with epinephrine.  A sterile bulky dressing was applied, followed by an Ace  bandage.   The  patient tolerated the procedure without complications.   PLAN:  Percocet for pain, office one week.                                               Claude Manges. Cleophas Dunker, M.D.    PWW/MEDQ  D:  09/10/2002  T:  09/10/2002  Job:  [id number]

## 2011-01-28 NOTE — Discharge Summary (Signed)
NAME:  [name] [name], [name] [name] NO.:  [id number]   MEDICAL RECORD NO.:  [id number]          PATIENT TYPE:  OIB   LOCATION:  2854                         FACILITY:  [facility]   PHYSICIAN:  Duke Salvia, MD, FACCDATE OF BIRTH:  1941-09-04   DATE OF ADMISSION:  09/08/2006  DATE OF DISCHARGE:  09/08/2006                               DISCHARGE SUMMARY   THIS PATIENT HAS NO KNOWN DRUG ALLERGIES.   PRINCIPAL DIAGNOSES:  1. Asymptomatic atrial fibrillation diagnosed about one and one half      years ago.  2. Attempted cardioversion with direct current, unsuccessful in      converting atrial fibrillation to sinus rhythm.  3. Coumadin therapy.   PROCEDURE:  DC CV without antiarrhythmic prophylaxis, unsuccessful in  converting to sinus rhythm, Dr. Sherryl Manges.   BRIEF HISTORY:  [name] [name] is a 70 year old male.  He has a history of  paroxysmal atrial fibrillation but it is now documented as persistent.  He is occasionally aware of palpitations but has no dyspnea, no chest  pain, and no real abridgment of his exercise tolerance.   He had a stress Myoview done December 2005, ejection fraction 48%, in  the context of atrial fibrillation, no ischemia.  Echocardiogram showed  normal left ventricular function.  The patient walks three miles, three  to four times a week.   HOSPITAL COURSE:  The patient presented for cardioversion on December  28th.  This was unsuccessful.  The patient had a long discussion with  Dr. Graciela Husbands regarding antiarrhythmic medications and their role in  converting his atrial fibrillation to sinus rhythm with and without  cardioversion.  The plan ultimately decided upon was that the patient  will return, Thursday, September 14, 2006.  He will have taken flecainide  over a 5-day period prior to his admission.  He will be cardioverted on  flecainide.  If this is unsuccessful, then IV ibutilide will be added  and cardioversion will be attempted for a second time.   The patient discharges December 28th on the following medications:  1. New medication, flecainide 100 mg one p.o. b.i.d.  2. Propranolol 10 mg, one half tab daily.  3. Coumadin as directed on a daily basis.      [name] [name], [place]      Duke Salvia, MD, [facility]  Electronically Signed    GM/MEDQ  D:  09/08/2006  T:  09/08/2006  Job:  [id number]   cc:   Jonita Albee, M.D.

## 2011-01-28 NOTE — H&P (Signed)
NAME:  [name] [name], [name] [name]                           ACCOUNT NO.:  [id number]   MEDICAL RECORD NO.:  [id number]                   PATIENT TYPE:  INP   LOCATION:  2029                                 FACILITY:  [facility]   PHYSICIAN:  [name] [name], M.D.             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 Glennon [name] as an add on in the office today in regards to  atrial fibrillation with a rapid ventricular rate.  Dr. Chestine [name] describes a  several year history of occasional fluttering sensations in his chest,  occasionally associated with anxiety.  The duration of this may last  approximately 30 minutes of unknown frequency.  He denies any other  associated symptoms.  While playing golf this past Saturday, the heat index  was 104 and he states he was not keeping himself hydrated.  He felt woozy  for approximately one minute.  He was playing golf with Dr. Ophelia [name] who  checked his pulse and noted it to be irregular and recommended him to follow  up with his primary care physician on Monday.  Dr. Chestine [name] saw Dr. Perrin [name] this  morning and an EKG in his office showed atrial fibrillation with ventricular  rate of 130.  At the time of that appointment, blood pressure was 96/61.  Dr. Perrin [name] ordered several labs consisting of an CMP, TSH, PSA.  Lab results  are not available at the time of this dictation.  He also prescribed him  digoxin 0.25 mg.  The patient had taken two pills earlier today and was  asked to take 0.25 mg everyday thereafter.  He was also referred to Dr. Glennon [name] for further evaluation.  Dr. Chestine [name] denies any associated chest  discomfort, shortness of breath, syncope, dizziness, or visual disturbances.   ALLERGIES:  No known drug allergies.   PAST MEDICAL HISTORY:  He denies any history of diabetes,  hypertension,  COPD, CVA, myocardial infarction, bleeding dyscrasias or thyroid  dysfunction.  He had his wisdom teeth removed when he was 70 years old.  He  had a left knee arthroscopy and a possible diagnosis of rheumatic fever when  he was 89-48 years old, but has never been told he has any problems with heart  murmur.  Dr. Elsie [name] also performed a stress test approximately 15 years ago  which, according to the patient, was normal.   CURRENT MEDICATIONS:  His only medication is what was started today, 0.25  mg, 2 tablets earlier today.   SOCIAL HISTORY:  He resides with his second wife.  He has two stepchildren  and two children who are alive and well.  No grandchildren, but one of his  children are expecting.  He is  a dentist, more specifically an orthodontist,  and exercises regularly by walking 3-4 miles 3-4 times a week, he is also a  regular on the golf course.  He denies any history of smoking or drug usage,  and may have an alcoholic beverage approximately one time per month.  He  denies any caffeine usage, chocolate, or pseudoephedrine usage.   FAMILY HISTORY:  His father died at the age of 57 with prostate cancer.  His  mother died at the age of 51, question old age and a history of a mild  myocardial infarction at the age of 48. He does not have any brothers.  He  has two sisters, 52 and 24, both alive and well.   REVIEW OF SYMPTOMS:  Notable for the previously mentioned items as well as  contacts.  His last dental exam was last year.  Anticipated surgery to  relieve right lower extremity numbness secondary to a pinched nerve.  This  surgery is tentatively scheduled for January.   PHYSICAL EXAMINATION:  GENERAL:  Well developed, well nourished, pleasant white male in no apparent  distress.  VITAL SIGNS:  Weight 190, blood pressure 100/64 in the left arm, pulse 98  and irregular.  HEENT:  Unremarkable.  NECK:  Supple without thyromegaly, adenopathy, JVD, or carotid  bruits.  CHEST:  Symmetrical excursion.  Lung clear to auscultation.  HEART:  No lifts, heaves, or thrills, PMI not displaced, he has a rapid  irregular rate and rhythm with a variable S1, no murmurs, rubs, clicks, or  gallops are appreciated.  ABDOMEN:  Slightly rounded, bowel sounds present without organomegaly,  masses, or tenderness.  EXTREMITIES:  No edema, cyanosis or clubbing.  All pulses were symmetric but  intact, irregular without any abdominal or femoral bruits.  NEUROLOGICAL:  Alert and oriented x 3, cranial nerves 2-12 are grossly  intact.   IMPRESSION:  1. Atrial fibrillation with a rapid ventricular rate of unknown duration.  2. Blood pressure not necessarily symptomatic hypotension, the patient     states his blood pressure usually remains low, this is prohibiting     aggressive outpatient management with medications.   PLAN:  Dr. Glennon [name] reviewed the patient's history, spoke with, and  examined the patient, as well as discussing the situation with [name] [name].  Dr. Corinda [name] feels the best case scenario for Dr. Chestine [name] would be to admit him  to the hospital for anticoagulation and observation with medications to  control his ventricular rate in view of his low blood pressure.  During the  hospitalization, we will also do an echocardiogram to further evaluate his  LV function and cardiac structure.  We will begin him on Lovenox 1 mg per kg  subcu q.12h. in anticipation of him being started on Coumadin with a  possible discharge of cross coverage as well as Coumadin therapy.  During  hospitalization, close follow up will be maintained in regards to his heart  rate and blood pressure.      [name] [name], P.A. [facility]                    [name] [name], M.D.    EW/MEDQ  D:  05/10/2004  T:  05/10/2004  Job:  [id number]   cc:   [name] [name], M.D.  [facility]  [address]  [place]  [place] [address]  Fax: [phone]

## 2011-01-28 NOTE — Discharge Summary (Signed)
NAME:  [name] [name], ARCIDIACONO                           ACCOUNT NO.:  [id number]   MEDICAL RECORD NO.:  [id number]                   PATIENT TYPE:  INP   LOCATION:  2029                                 FACILITY:  [facility]   PHYSICIAN:  Cecil Cranker, M.D.             DATE OF BIRTH:  [birth year]   DATE OF ADMISSION:  DATE OF DISCHARGE:  05/11/2004                                 DISCHARGE SUMMARY   DISCHARGE DIAGNOSES:  New onset atrial fibrillation with conversion to sinus  rhythm on both digoxin and Cardizem.  The patient had both hypotension and  bradycardia, and Cardizem was discontinued.   SECONDARY DIAGNOSIS:  Status post left-knee arthroscopy.   DISCHARGE DISPOSITION:  [name] is ready for discharge hospital day  #1.  He was admitted to [facility] on August 29, in atrial  fibrillation with rapid ventricular rate.  Rate control was attained by  administering digoxin and Cardizem, and he converted at 10:02 p.m. the  evening of his admission to sinus rhythm, and has maintained a sinus  bradycardia since that time.  He does have occasional PAC's however.   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  1.  Digoxin 0.125 mg daily.  2.  Lovenox 100 mg per cc, refill syringes.  He is to get 85 mg at 12-hour      intervals, twice daily.  3.  Coumadin 5 mg daily or as directed by the Coumadin clinic.   DISCHARGE DIAGNOSES:  A low-sodium, low-cholesterol diet.   FOLLOWUP:  He is to present to the [facility] on Thursday,  September 1 at 9:30 in the morning, and then follow up on Monday, September  12, at 1 o'clock.  He also sees Dr. Corinda Gubler on Monday, September 12, at 1:45  p.m.   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 Graceann Congress as an add-on in the office today with presentation of atrial  fibrillation, rapid ventricular rate.  [name] describes a several-year-  history of occasional fluttering sensation in  his chest, occasionally  associated with anxiety.  The duration may last about 30 minutes, but  frequency is unknown.  He denies any other associated symptoms.  While  playing golf this Saturday, the heat index was 104, and he states that he  was not keeping himself hydrated.  He felt woozy for approximately one  minute.  A fellow partner playing golf checked his pulse and noted it was  irregular and recommended that he follow up with his primary care physician  on  Monday, August 22.  [name] saw Dr. Shelton Silvas on the morning of August 29.  Electrocardiogram showed atrial fibrillation with ventricular rate of 130.  At the time of that appointment, blood pressure was 96/61.  Dr. Shelton Silvas ordered  several labs consisting of CMP, TSH, and PSA.  Lab results are now  available.  He also prescribed digoxin 0.25 mg. The patient is taking 2.5 mg  during the day, and has been prescribed 0.25 mg daily thereafter.  [name]  denies any associated chest discomfort, shortness of breath, syncope,  dizziness, or visual disturbances.  He will be admitted to [facility] and additionally placed on Cardizem and placed on a monitor.   HOSPITAL COURSE:  After having been seen on August 29 at the [facility]  Cardiology office, he was admitted to [facility].  He had received  two tablets of digoxin orally loading during the day.  He was started on  digoxin and Cardizem 30 mg every six hours.  He converted easily to sinus  rhythm at about 10 p.m. on August 29.  He has been bradycardic ever since  conversion with heart rates between 50 and 60 beats per minute.   MEDICATIONS:  He goes home with the following medications:  1.  Lovenox prefilled syringes, 100 mg per cc, giving 85 mg every 12 hours.  2.  Digoxin.  This has changed now to 0.25 mg daily.  3.  Coumadin 5 mg daily as directed by the Coumadin clinic.      Maple Mirza, Anders Simmonds, M.D.    GM/MEDQ  D:   05/11/2004  T:  05/12/2004  Job:  [id number]   cc:   Cecil Cranker, M.D.   Jonita Albee, M.D.  [facility]  [address]  [place]  [place] [address]  Fax: [phone]

## 2011-01-28 NOTE — Procedures (Signed)
[facility]                              EXERCISE TREADMILL   NAME:[name] [name], [name] [name] DR.                      MRN:          [id number]  DATE:10/12/2006                            DOB:          04-09-1941    CARDIOLOGISTS:  Duke Salvia, M.D. and Cecil Cranker, M.D.   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 Graciela Husbands with a history of paroxysmal atrial fibrillation  who has undergone cardioversion x3.  He is now on flecainide and needs  to undergo exercise treadmill testing to rule out ventricular  arrhythmias.   EXERCISE TREADMILL TEST:  The patient exercised for 9 minutes 51 seconds  according to Bruce protocol, achieving work level of 11.6 METs.  His  resting heart rate went from 53 beats per minute to a maximum of 122  beats per minute.  This value represented 78% of his maximal age-  predicted heart rate.  The resting blood pressure went from 103/80 to a  maximum of 148/73.  The test was stopped secondary to fatigue.  He  denied any chest pain.  He was short of breath at the end of the test.   Electrocardiogram at baseline revealed sinus bradycardia with a heart  rate of 50 with nonspecific ST-T wave changes.  During exercise he had  no significant ST-T wave changes to suggest ischemia or injury.  However, he did not achieve 85% of his maximal age-predicted heart rate.  There were no ventricular arrhythmias during exercise and a rare PVC in  recovery.   IMPRESSION:  Exercise treadmill test negative for ventricular  arrhythmias.   DISPOSITION:  The patient will follow up with Dr. Graciela Husbands in 4 weeks time.      Tereso Newcomer, PA-C  Electronically Signed      Duke Salvia, MD, [facility]  Electronically Signed   SW/MedQ  DD: 10/12/2006  DT: 10/12/2006  Job #: [id number]

## 2011-08-22 ENCOUNTER — Encounter (INDEPENDENT_AMBULATORY_CARE_PROVIDER_SITE_OTHER): Payer: Medicare Other | Admitting: Internal Medicine

## 2011-08-22 DIAGNOSIS — Z0000 Encounter for general adult medical examination without abnormal findings: Secondary | ICD-10-CM

## 2011-08-22 DIAGNOSIS — I4891 Unspecified atrial fibrillation: Secondary | ICD-10-CM

## 2011-08-22 DIAGNOSIS — C61 Malignant neoplasm of prostate: Secondary | ICD-10-CM

## 2011-08-22 DIAGNOSIS — K625 Hemorrhage of anus and rectum: Secondary | ICD-10-CM

## 2011-10-24 ENCOUNTER — Telehealth: Payer: Self-pay | Admitting: Internal Medicine

## 2011-10-24 NOTE — Telephone Encounter (Signed)
Pt states that for the past 4 months he has noticed that at least once a month there is a "paint ball-sized" amount of bright red blood in his stool. Pt is concerned and requests to be seen. Last colon done by Dr. Marina Goodell 10/07/09. Pt scheduled to see Dr. Marina Goodell 11/04/11@10am . Pt aware of appt date and time.

## 2011-11-01 DIAGNOSIS — C61 Malignant neoplasm of prostate: Secondary | ICD-10-CM | POA: Diagnosis not present

## 2011-11-04 ENCOUNTER — Ambulatory Visit (INDEPENDENT_AMBULATORY_CARE_PROVIDER_SITE_OTHER): Payer: Medicare Other | Admitting: Internal Medicine

## 2011-11-04 ENCOUNTER — Encounter: Payer: Self-pay | Admitting: Internal Medicine

## 2011-11-04 VITALS — BP 112/60 | HR 72 | Ht 71.0 in | Wt 202.0 lb

## 2011-11-04 DIAGNOSIS — K625 Hemorrhage of anus and rectum: Secondary | ICD-10-CM

## 2011-11-04 DIAGNOSIS — K648 Other hemorrhoids: Secondary | ICD-10-CM | POA: Diagnosis not present

## 2011-11-04 DIAGNOSIS — Z86010 Personal history of colonic polyps: Secondary | ICD-10-CM | POA: Diagnosis not present

## 2011-11-04 MED ORDER — PEG-KCL-NACL-NASULF-NA ASC-C 100 G PO SOLR: 1.0000 | Freq: Once | ORAL | Status: DC

## 2011-11-04 NOTE — Patient Instructions (Addendum)
You have been scheduled for a colonoscopy with propofol. Please follow written instructions given to you at your visit today.  Please pick up your prep kit at the pharmacy within the next 1-3 days.  

## 2011-11-04 NOTE — Progress Notes (Signed)
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 Index exam in October of 2005 revealing hyperplastic polyps. Followup examination January 2011 with both adenomatous and hyperplastic polyps (diminutive). Also noted, internal hemorrhoids. Followup in 5 years recommended. He presents today regarding problems with rectal bleeding. He describes about 4 episodes, over the past 4 months, where he spontaneously passes a large blood clot per rectum without warning or notice. Resultant damage of undergarments. He denies blood associated with defecation, abdominal pain, rectal pain. Of importance, about 6 months after his last colonoscopy, he did receive external radiation therapy for prostate cancer. GI review of systems is otherwise negative  REVIEW OF SYSTEMS:  All non-GI ROS negative except for back pain  Past Medical History  Diagnosis Date  . Atrial fibrillation   . Prostate cancer   . Colon polyps     hyperplastic and tubular adenomatous  . Thrombocytopenia     Past Surgical History  Procedure Date  . Knee arthroscopy     left  . Tonsillectomy   . Back surgery   . Robot assisted laparoscopic radical prostatectomy   . Vasectomy     Social History [name] [name]  reports that he has never smoked. He has never used smokeless tobacco. He reports that he does not drink alcohol or use illicit drugs.  family history includes Prostate cancer in his father.  There is no history of Colon cancer.  Not on File     PHYSICAL EXAMINATION: Vital signs: BP 112/60  Pulse 72  Ht 5\' 11"  (1.803 m)  Wt 202 lb (91.627 kg)  BMI 28.17 kg/m2  Constitutional: generally well-appearing, no acute distress Psychiatric: alert and oriented x3, cooperative Eyes: extraocular movements intact, anicteric, conjunctiva pink Mouth: oral pharynx moist, no lesions Neck: supple no lymphadenopathy Cardiovascular: heart regular rate and rhythm, no  murmur Lungs: clear to auscultation bilaterally Abdomen: soft, nontender, nondistended, no obvious ascites, no peritoneal signs, normal bowel sounds, no organomegaly Rectal:small external hemorrhoid tag. Mild irritation of perirectal skin. Internal exam with PCP negative. Not repeated. Extremities: no lower extremity edema bilaterally Skin: no lesions on visible extremities Neuro: No focal deficits.   ASSESSMENT:  #1. Episodic rectal bleeding as described. 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 Known internal hemorrhoids #3. History of adenomatous colon polyps. Last colonoscopy January 2011 #4. Prostate cancer status post radiation therapy  PLAN:  #1. Colonoscopy.The nature of the procedure, as well as the risks, benefits, and alternatives were carefully and thoroughly reviewed with the patient. Ample time for discussion and questions allowed. The patient understood, was satisfied, and agreed to proceed. Movi prep prescribed. The patient instructed on its use

## 2011-11-17 DIAGNOSIS — H1045 Other chronic allergic conjunctivitis: Secondary | ICD-10-CM | POA: Diagnosis not present

## 2011-11-17 DIAGNOSIS — H10439 Chronic follicular conjunctivitis, unspecified eye: Secondary | ICD-10-CM | POA: Diagnosis not present

## 2012-01-05 ENCOUNTER — Encounter: Payer: Self-pay | Admitting: Internal Medicine

## 2012-01-05 ENCOUNTER — Ambulatory Visit (AMBULATORY_SURGERY_CENTER): Payer: Medicare Other | Admitting: Internal Medicine

## 2012-01-05 VITALS — BP 108/70 | HR 58 | Temp 97.7°F | Resp 20 | Ht 71.0 in | Wt 202.0 lb

## 2012-01-05 DIAGNOSIS — Z86010 Personal history of colonic polyps: Secondary | ICD-10-CM | POA: Diagnosis not present

## 2012-01-05 DIAGNOSIS — K6289 Other specified diseases of anus and rectum: Secondary | ICD-10-CM

## 2012-01-05 DIAGNOSIS — Z1211 Encounter for screening for malignant neoplasm of colon: Secondary | ICD-10-CM | POA: Diagnosis not present

## 2012-01-05 DIAGNOSIS — K625 Hemorrhage of anus and rectum: Secondary | ICD-10-CM

## 2012-01-05 DIAGNOSIS — K627 Radiation proctitis: Secondary | ICD-10-CM

## 2012-01-05 DIAGNOSIS — K648 Other hemorrhoids: Secondary | ICD-10-CM

## 2012-01-05 DIAGNOSIS — R195 Other fecal abnormalities: Secondary | ICD-10-CM | POA: Diagnosis not present

## 2012-01-05 MED ORDER — SODIUM CHLORIDE 0.9 % IV SOLN: 500.0000 mL | INTRAVENOUS | Status: DC

## 2012-01-05 NOTE — Op Note (Signed)
[facility] [address] [place], [place]  [address]  COLONOSCOPY PROCEDURE REPORT  PATIENT:  [name] [name], [name] [name]  MR#:  [id number] BIRTHDATE:  06-26-1941, 70 yrs. old  GENDER:  male ENDOSCOPIST:  Wilhemina Bonito. Eda Keys, MD REF. BY:  Office PROCEDURE DATE:  01/05/2012 PROCEDURE:  Diagnostic Colonoscopy ASA CLASS:  Class II INDICATIONS:  history of pre-cancerous (adenomatous) colon polyps, surveillance and high-risk screening, rectal bleeding ; index colonoscopy 2006, f/u 09-2009 (TA) MEDICATIONS:   MAC sedation, administered by CRNA, propofol (Diprivan) 100 mg IV  DESCRIPTION OF PROCEDURE:   After the risks benefits and alternatives of the procedure were thoroughly explained, informed consent was obtained.  Digital rectal exam was performed and revealed no abnormalities.   The LB CF-[id number] endoscope was introduced through the anus and advanced to the cecum, which was identified by both the appendix and ileocecal valve, without limitations.  The quality of the prep was excellent, using MoviPrep.  The instrument was then slowly withdrawn as the colon was fully examined. <<PROCEDUREIMAGES>>  FINDINGS:  There were mucosal changes consistent with radiation proctitis seen in distal 2-3cm (1/3 circumference) the rectum. Moderate diverticulosis was found in the sigmoid colon.  Otherwise normal colonoscopy without polyps, masses, vascular ectasias, or inflammatory changes.   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    The time to cecum =  2:29  minutes. The scope was then withdrawn in   8:24  minutes from the cecum and the procedure completed.  COMPLICATIONS:  None ENDOSCOPIC IMPRESSION: 1)  RADIATION PROCTITIS 2) Moderate diverticulosis in the sigmoid colon 3) Otherwise normal colonoscopy  RECOMMENDATIONS: 1) Flexible sigmoidoscopy WITH ARGON PLASMA COACULATION (APC) THERAPY AT HOSPITAL - MY OFFICE NURSR WILL CALL TOARRANGE 2) Follow up colonoscopy in  5 years ______________________________ Wilhemina Bonito. Eda Keys, MD  CC:  Robert Bellow, MDThe Patient  n. Rosalie DoctorWilhemina Bonito. Eda Keys at 01/05/2012 02:06 PM  Marlin Canary, [phone]

## 2012-01-05 NOTE — Progress Notes (Signed)
Patient did not experience any of the following events: a burn prior to discharge; a fall within the facility; wrong site/side/patient/procedure/implant event; or a hospital transfer or hospital admission upon discharge from the facility. (G8907) Patient did not have preoperative order for IV antibiotic SSI prophylaxis. (G8918)  

## 2012-01-05 NOTE — Progress Notes (Signed)
Propofol administered by S Camp CRNA 

## 2012-01-05 NOTE — Progress Notes (Signed)
Pt states that this am he had small amt bleeding noted with wiping. Bleeding from the rectum is one reason he is here and pt wanted to mention this prior to the procedure. ewm

## 2012-01-06 ENCOUNTER — Telehealth: Payer: Self-pay

## 2012-01-06 ENCOUNTER — Other Ambulatory Visit: Payer: Self-pay

## 2012-01-06 NOTE — Telephone Encounter (Signed)
Left message on answering machine. 

## 2012-01-06 NOTE — Progress Notes (Signed)
Patient is scheduled for 02/10/12 8:30 for flex with APC.  Pre-visit 01/31/12 1:30

## 2012-01-06 NOTE — Telephone Encounter (Signed)
Patient is scheduled for Flex with APC [facility] 02/10/12 8:30, pre-visit 02/01/12 1:30.  He will  Call back for any questions or concerns

## 2012-01-06 NOTE — Telephone Encounter (Signed)
Message copied by Annett Fabian on Fri Jan 06, 2012 10:56 AM ------      Message from: Hilarie Fredrickson      Created: Thu Jan 05, 2012  4:47 PM       Hospital week please. He will need a flex prep. Thanks       ----- Message -----         From: Rossie Muskrat, RN,CGRN         Sent: 01/05/2012   4:29 PM           To: Hilarie Fredrickson, MD            Dr Marina Goodell do you want this Flex with APC procedure scheduled during your next hospital week or try for a lunch time case?  You are not back in the hospital until 5/28                  [facility]

## 2012-01-31 ENCOUNTER — Ambulatory Visit (AMBULATORY_SURGERY_CENTER): Payer: Medicare Other | Admitting: *Deleted

## 2012-01-31 VITALS — Ht 71.0 in | Wt 200.0 lb

## 2012-01-31 DIAGNOSIS — K627 Radiation proctitis: Secondary | ICD-10-CM

## 2012-01-31 DIAGNOSIS — K6289 Other specified diseases of anus and rectum: Secondary | ICD-10-CM

## 2012-01-31 DIAGNOSIS — C61 Malignant neoplasm of prostate: Secondary | ICD-10-CM | POA: Diagnosis not present

## 2012-02-10 ENCOUNTER — Encounter (HOSPITAL_COMMUNITY): Payer: Self-pay | Admitting: *Deleted

## 2012-02-10 ENCOUNTER — Ambulatory Visit (HOSPITAL_COMMUNITY)
Admission: RE | Admit: 2012-02-10 | Discharge: 2012-02-10 | Disposition: A | Discharge status: Home / Self Care | Payer: Medicare Other | Source: Ambulatory Visit | Attending: Internal Medicine | Admitting: Internal Medicine

## 2012-02-10 ENCOUNTER — Encounter (HOSPITAL_COMMUNITY)
Admission: RE | Disposition: A | Discharge status: Home / Self Care | Payer: Self-pay | Source: Ambulatory Visit | Attending: Internal Medicine

## 2012-02-10 DIAGNOSIS — K5120 Ulcerative (chronic) proctitis without complications: Secondary | ICD-10-CM | POA: Diagnosis not present

## 2012-02-10 DIAGNOSIS — Y842 Radiological procedure and radiotherapy as the cause of abnormal reaction of the patient, or of later complication, without mention of misadventure at the time of the procedure: Secondary | ICD-10-CM | POA: Insufficient documentation

## 2012-02-10 DIAGNOSIS — K6289 Other specified diseases of anus and rectum: Secondary | ICD-10-CM | POA: Diagnosis not present

## 2012-02-10 DIAGNOSIS — N529 Male erectile dysfunction, unspecified: Secondary | ICD-10-CM | POA: Diagnosis not present

## 2012-02-10 DIAGNOSIS — R310 Gross hematuria: Secondary | ICD-10-CM | POA: Diagnosis not present

## 2012-02-10 DIAGNOSIS — K625 Hemorrhage of anus and rectum: Secondary | ICD-10-CM | POA: Insufficient documentation

## 2012-02-10 DIAGNOSIS — C61 Malignant neoplasm of prostate: Secondary | ICD-10-CM | POA: Diagnosis not present

## 2012-02-10 HISTORY — PX: FLEXIBLE SIGMOIDOSCOPY: SHX5431

## 2012-02-10 SURGERY — SIGMOIDOSCOPY, FLEXIBLE
Anesthesia: Moderate Sedation

## 2012-02-10 MED ORDER — FENTANYL CITRATE 0.05 MG/ML IJ SOLN
INTRAMUSCULAR | Status: AC
  Filled 2012-02-10: qty 2

## 2012-02-10 MED ORDER — MIDAZOLAM HCL 10 MG/2ML IJ SOLN
INTRAMUSCULAR | Status: AC
  Filled 2012-02-10: qty 2

## 2012-02-10 MED ORDER — SODIUM CHLORIDE 0.9 % IV SOLN: Freq: Once | INTRAVENOUS | Status: DC

## 2012-02-10 MED ORDER — DIPHENHYDRAMINE HCL 50 MG/ML IJ SOLN
INTRAMUSCULAR | Status: AC
  Filled 2012-02-10: qty 1

## 2012-02-10 NOTE — H&P (Signed)
Here for flex APC for radiation proctitis. No interval change since colonoscopy.  Vital signs: BP 107/71  Pulse 57  Temp 97.6 F (36.4 C)  Resp 15  SpO2 97% General: Well-developed, well-nourished, no acute distress HEENT: Sclerae are anicteric, conjunctiva pink. Oral mucosa intact Lungs: Clear Heart: Regular Abdomen: soft, nontender, nondistended, no obvious ascites, no peritoneal signs, normal bowel sounds. No organomegaly. Extremities: No edema Psychiatric: alert and oriented x3. Cooperative   Plan :flex / APC.The nature of the procedure, as well as the risks, benefits, and alternatives were carefully and thoroughly reviewed with the patient. Ample time for discussion and questions allowed. The patient understood, was satisfied, and agreed to proceed.   Wilhemina Bonito. Eda Keys., M.D. [facility] Division of Gastroenterology

## 2012-02-10 NOTE — Op Note (Signed)
[facility] [address] [place], [place]  [address]  FLEXIBLE SIGMOIDOSCOPY PROCEDURE REPORT  PATIENT:  [name], [name]  MR#:  [id number] BIRTHDATE:  1941-06-28, 70 yrs. old  GENDER:  male  ENDOSCOPIST:  Wilhemina Bonito. Eda Keys, MD Referred by:  .Direct  PROCEDURE DATE:  02/10/2012 PROCEDURE:  Flexible sigmoidoscopy with APC ablation of lesion ASA CLASS:  Class II INDICATIONS:  rectal bleeding ; radiation proctitis  MEDICATIONS:   none  DESCRIPTION OF PROCEDURE:   After the risks benefits and alternatives of the procedure were thoroughly explained, informed consent was obtained.  Digital rectal exam was performed and revealed no abnormalities.   The Pentax Gastroscope Therapeutic [id number] endoscope was introduced through the anus and advanced to the rectum, without limitations.  The quality of the prep was excellent.  The instrument was then slowly withdrawn as the mucosa was fully examined. 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   Treated with APC ablation. The scope was then withdrawn from the patient and the procedure terminated.  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 Eda Keys, MD  CC:  The Patient,      Robert Bellow, MD  n. Rosalie Doctor:   Wilhemina Bonito. Eda Keys at 02/10/2012 09:38 AM  Marlin Canary, [phone]

## 2012-02-10 NOTE — Discharge Instructions (Signed)
Flexible Sigmoidoscopy Your caregiver has ordered a flexible sigmoidoscopy. This is an exam to evaluate your lower colon. In this exam your colon is cleansed and a short fiber optic tube is inserted through your rectum and into your colon. The fiber optic scope (endoscope) is a short bundle of enclosed flexible small glass fibers. It transmits light to the area examined and images from that area to your caregiver. You do not have to worry about glass breakage in the endoscope. Discomfort is usually minimal. Sedatives and pain medications are generally not required. This exam helps to detect tumors (lumps), polyps, inflammation (swelling and soreness), and areas of bleeding. It may also be used to take biopsies. These are small pieces of tissue taken to examine under a microscope. LET YOUR CAREGIVER KNOW ABOUT:  Allergies.   Medications taken including herbs, eye drops, over the counter medications, and creams.   Use of steroids (by mouth or creams).   Previous problems with anesthetics or novocaine   Possibility of pregnancy, if this applies.   History of blood clots (thrombophlebitis).   History of bleeding or blood problems.   Previous surgery.   Other health problems.  BEFORE THE PROCEDURE Eat normally the night before the exam. Your caregiver may order a mild enema or laxative the night before. No eating or drinking should occur after midnight until the procedure is completed. A rectal suppository or enemas may be given in the morning prior to your procedure. You will be brought to the examination area in a hospital gown. You should be present 60 minutes prior to your procedure or as directed.  AFTER THE PROCEDURE   There is sometimes a little blood passed with the first bowel movement. Do not be concerned. Because air is often used during the exam, it is not unusual to pass gas and experience abdominal (belly) cramping. Walking or a warm pack on your abdomen may help with this. Do not  sleep with a heating pad as burns can occur.   You may resume all normal eating and activities.   Only take over-the-counter or prescription medicines for pain, discomfort, or fever as directed by your caregiver. Do not use aspirin or blood thinners if a biopsy (tissue sample) was taken. Consult your caregiver for medication usage if biopsies were taken.   Call for your results as instructed by your caregiver. Remember, it is your responsibility to obtain the results of your biopsy. Do not assume everything is fine because you do not hear from your caregiver.  SEEK IMMEDIATE MEDICAL CARE IF:  An oral temperature above 102 F (38.9 C) develops.   You pass large blood clots or fill a toilet with blood following the procedure. This may also occur 10 to 14 days following the procedure. It is more likely if a biopsy was taken.   You develop abdominal pain not relieved with medication or that is getting worse rather than better.  Document Released: 08/26/2000 Document Revised: 08/18/2011 Document Reviewed: 06/08/2005 [employer] Patient Information 2012 [employer]. 

## 2012-02-14 ENCOUNTER — Encounter (HOSPITAL_COMMUNITY): Payer: Self-pay | Admitting: Internal Medicine

## 2012-03-05 ENCOUNTER — Telehealth: Payer: Self-pay | Admitting: Internal Medicine

## 2012-03-05 MED ORDER — MESALAMINE 1000 MG RE SUPP: RECTAL | Status: DC

## 2012-03-05 NOTE — Telephone Encounter (Signed)
Informed pt of Dr Lamar Sprinkles orders and recommendations and it's too early to repeat APC. Pt will call in about a month or earlier for problems.

## 2012-03-05 NOTE — Telephone Encounter (Signed)
First, tell. [name] that I appreciated his nice note. In terms of the bleeding episodes, let's try Canasa suppositories, 1 g twice a day for one month. Have him give [place] a followup call at that time. We could consider repeat APC, but a bit early at this point.

## 2012-03-05 NOTE — Telephone Encounter (Signed)
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 He reports this tends to be his cycle; every 3-4 weeks he passes the blood just like reported on his 11/04/11 visit.[name] Kitchen He is not taking rowasa enemas or canasa suppositories; takes about 4 ibuprofen daily. Please advise. Thanks.

## 2012-03-07 ENCOUNTER — Telehealth: Payer: Self-pay | Admitting: Internal Medicine

## 2012-03-07 NOTE — Telephone Encounter (Signed)
Pt called and states that the insurance would only allow for him to get a quantity of 30 canasa supp when he filled the rx. For him to get an additional 30 for 15 more days he would have to pay 860.00 out of pocket. Pt wants to know if he needs to take them BID or could he just take daily. Also wondered if there was a generic supp he could try. Please advise.

## 2012-03-08 NOTE — Telephone Encounter (Signed)
Spoke with pt and he is aware. 

## 2012-03-08 NOTE — Telephone Encounter (Signed)
He can take one qhs, then

## 2012-05-17 DIAGNOSIS — C61 Malignant neoplasm of prostate: Secondary | ICD-10-CM | POA: Diagnosis not present

## 2012-07-04 ENCOUNTER — Ambulatory Visit (INDEPENDENT_AMBULATORY_CARE_PROVIDER_SITE_OTHER): Payer: Medicare Other | Admitting: Family Medicine

## 2012-07-04 ENCOUNTER — Encounter: Payer: Self-pay | Admitting: Family Medicine

## 2012-07-04 VITALS — BP 129/81 | HR 63 | Ht 70.0 in | Wt 200.0 lb

## 2012-07-04 DIAGNOSIS — M25529 Pain in unspecified elbow: Secondary | ICD-10-CM | POA: Diagnosis not present

## 2012-07-04 DIAGNOSIS — M25521 Pain in right elbow: Secondary | ICD-10-CM

## 2012-07-04 NOTE — Patient Instructions (Addendum)
You have medial epicondylitis (golfer's elbow) Avoid painful activities as much as possible (unless doing home exercises). Tylenol or aleve as needed for pain. Icing 3-4 times a day - careful around ulnar nerve on inside of elbow as we discussed. Counterforce brace may be helpful to unload area of pain while it heals. Start physical therapy and do home exercises on days you do not go there. Can consider cortisone injection into area of pain. Topical nitro an option for this as well if you do not improve. Follow up with me in 1 month.

## 2012-07-05 ENCOUNTER — Encounter: Payer: Self-pay | Admitting: Family Medicine

## 2012-07-05 DIAGNOSIS — M25521 Pain in right elbow: Secondary | ICD-10-CM | POA: Insufficient documentation

## 2012-07-05 NOTE — Progress Notes (Signed)
  Subjective:    Patient ID: [name] [name], male    DOB: 18-Jul-1941, 71 y.o.   MRN: [id number]  PCP: Dr. Perrin Maltese  HPI 71 yo M here for right elbow pain.  Patient reports on 10/14 while playing gold he felt a pull in medial right elbow after hitting a tee shot on golf course. No swelling or bruising. Unable to finish playing. Has had problems in this location before but not due to an injury. Taking 2 advil 3x/day. Icing as well. Is right handed. Feels some better compared to when injury occurred.  Past Medical History  Diagnosis Date  . Atrial fibrillation   . Colon polyps     hyperplastic and tubular adenomatous  . Thrombocytopenia   . Radiation     for prostate cancer  . Prostate cancer     Current Outpatient Prescriptions on File Prior to Visit  Medication Sig Dispense Refill  . ibuprofen (ADVIL,MOTRIN) 200 MG tablet Take 400 mg by mouth every 6 (six) hours as needed. pain      . mesalamine (CANASA) 1000 MG suppository Insert one suppository twice daily for one month for rectal bleeding.  60 suppository  1  . PATADAY 0.2 % SOLN         Past Surgical History  Procedure Date  . Knee arthroscopy     left  . Tonsillectomy   . Back surgery   . Robot assisted laparoscopic radical prostatectomy 2009    radiation Tx 2011  . Vasectomy   . Colonoscopy   . Polypectomy   . Atrial ablation surgery     for a fib x 5 years ago  . Flexible sigmoidoscopy 02/10/2012    Procedure: FLEXIBLE SIGMOIDOSCOPY;  Surgeon: Hilarie Fredrickson, MD;  Location: [facility];  Service: Endoscopy;  Laterality: N/A;  need APC     No Known Allergies  History   Social History  . Marital Status: Married    Spouse Name: N/A    Number of Children: 4  . Years of Education: N/A   Occupational History  . orhodontist    Social History Main Topics  . Smoking status: Never Smoker   . Smokeless tobacco: Never Used  . Alcohol Use: No  . Drug Use: No  . Sexually Active: Not on file   Other Topics  Concern  . Not on file   Social History Narrative  . No narrative on file    Family History  Problem Relation Age of Onset  . Prostate cancer Father   . Colon cancer Neg Hx   . Heart disease Mother     BP 129/81  Pulse 63  Ht 5\' 10"  (1.778 m)  Wt 200 lb (90.719 kg)  BMI 28.70 kg/m2  Review of Systems See HPI above.    Objective:   Physical Exam Gen: NAD  R elbow: Minimal swelling medially.  No bruising, other deformity. TTP medial epicondyle reproducing his pain.  No lateral, posterior, other elbow TTP. FROM with 5/5 strength with flexion/extension and wrist motions - mild pain with resisted finger flexion and pronation. Collateral ligaments intact without pain. NVI distally     Assessment & Plan:  1. Right elbow pain - consistent with flexor mass strain/partial tear and medial epicondylitis.  Start with formal physical therapy and home exercise program.  Tylenol, nsaids as needed.  Consider injection, topical nitro if not improving.  F/u in 1 month.

## 2012-07-05 NOTE — Assessment & Plan Note (Signed)
consistent with flexor mass strain/partial tear and medial epicondylitis.  Start with formal physical therapy and home exercise program.  Tylenol, nsaids as needed.  Consider injection, topical nitro if not improving.  F/u in 1 month.

## 2012-07-13 DIAGNOSIS — M7700 Medial epicondylitis, unspecified elbow: Secondary | ICD-10-CM | POA: Diagnosis not present

## 2012-07-16 DIAGNOSIS — M7700 Medial epicondylitis, unspecified elbow: Secondary | ICD-10-CM | POA: Diagnosis not present

## 2012-07-18 DIAGNOSIS — M7700 Medial epicondylitis, unspecified elbow: Secondary | ICD-10-CM | POA: Diagnosis not present

## 2012-07-20 DIAGNOSIS — M7700 Medial epicondylitis, unspecified elbow: Secondary | ICD-10-CM | POA: Diagnosis not present

## 2012-07-23 DIAGNOSIS — M7700 Medial epicondylitis, unspecified elbow: Secondary | ICD-10-CM | POA: Diagnosis not present

## 2012-07-26 DIAGNOSIS — H1045 Other chronic allergic conjunctivitis: Secondary | ICD-10-CM | POA: Diagnosis not present

## 2012-07-27 DIAGNOSIS — M7700 Medial epicondylitis, unspecified elbow: Secondary | ICD-10-CM | POA: Diagnosis not present

## 2012-07-30 DIAGNOSIS — M7700 Medial epicondylitis, unspecified elbow: Secondary | ICD-10-CM | POA: Diagnosis not present

## 2012-08-13 ENCOUNTER — Ambulatory Visit (INDEPENDENT_AMBULATORY_CARE_PROVIDER_SITE_OTHER): Payer: Medicare Other | Admitting: Internal Medicine

## 2012-08-13 ENCOUNTER — Encounter: Payer: Self-pay | Admitting: Internal Medicine

## 2012-08-13 VITALS — BP 124/78 | HR 59 | Temp 97.9°F | Resp 16 | Ht 69.5 in | Wt 207.0 lb

## 2012-08-13 DIAGNOSIS — Z23 Encounter for immunization: Secondary | ICD-10-CM

## 2012-08-13 DIAGNOSIS — E782 Mixed hyperlipidemia: Secondary | ICD-10-CM | POA: Diagnosis not present

## 2012-08-13 DIAGNOSIS — Z0000 Encounter for general adult medical examination without abnormal findings: Secondary | ICD-10-CM

## 2012-08-13 DIAGNOSIS — I4891 Unspecified atrial fibrillation: Secondary | ICD-10-CM | POA: Insufficient documentation

## 2012-08-13 DIAGNOSIS — C61 Malignant neoplasm of prostate: Secondary | ICD-10-CM | POA: Diagnosis not present

## 2012-08-13 LAB — CBC WITH DIFFERENTIAL/PLATELET
Eosinophils Relative: 5 % (ref 0–5)
HCT: 40.8 % (ref 39.0–52.0)
Hemoglobin: 14.6 g/dL (ref 13.0–17.0)
Lymphocytes Relative: 24 % (ref 12–46)
MCV: 85 fL (ref 78.0–100.0)
Monocytes Absolute: 0.8 10*3/uL (ref 0.1–1.0)
Monocytes Relative: 17 % — ABNORMAL HIGH (ref 3–12)
Neutro Abs: 2.5 10*3/uL (ref 1.7–7.7)
WBC: 4.8 10*3/uL (ref 4.0–10.5)

## 2012-08-13 LAB — COMPREHENSIVE METABOLIC PANEL
ALT: 20 U/L (ref 0–53)
AST: 21 U/L (ref 0–37)
BUN: 19 mg/dL (ref 6–23)
CO2: 27 mEq/L (ref 19–32)
Calcium: 9.5 mg/dL (ref 8.4–10.5)
Chloride: 105 mEq/L (ref 96–112)
Creat: 0.75 mg/dL (ref 0.50–1.35)
Total Bilirubin: 0.7 mg/dL (ref 0.3–1.2)

## 2012-08-13 LAB — LIPID PANEL
Cholesterol: 218 mg/dL — ABNORMAL HIGH (ref 0–200)
HDL: 44 mg/dL (ref >39)
LDL Cholesterol: 149 mg/dL — ABNORMAL HIGH (ref 0–99)
Total CHOL/HDL Ratio: 5 Ratio
Triglycerides: 126 mg/dL (ref <150)
VLDL: 25 mg/dL (ref 0–40)

## 2012-08-13 LAB — POCT UA - MICROSCOPIC ONLY
Crystals, Ur, HPF, POC: NEGATIVE
Mucus, UA: NEGATIVE
Yeast, UA: NEGATIVE

## 2012-08-13 LAB — POCT URINALYSIS DIPSTICK
Bilirubin, UA: NEGATIVE
Blood, UA: NEGATIVE
Leukocytes, UA: NEGATIVE
Nitrite, UA: NEGATIVE
Protein, UA: NEGATIVE
pH, UA: 7

## 2012-08-13 NOTE — Progress Notes (Signed)
  Subjective:    Patient ID: [name] [name], male    DOB: 01/26/1941, 71 y.o.   MRN: [id number]  HPI    Review of Systems  Gastrointestinal: Positive for anal bleeding.  Genitourinary: Positive for frequency.       Objective:   Physical Exam        Assessment & Plan:

## 2012-08-13 NOTE — Progress Notes (Signed)
  Subjective:    Patient ID: [name] [name], male    DOB: 13-Nov-1940, 71 y.o.   MRN: [id number]  HPI Prostate cancer controlled Afib ablated and never returned. Has new tremor, very fine but does fine work with his hands. No rigidity or unsteadyness noticed. Playing golf with no problems. See scanned hx    Review of Systems  Constitutional: Negative.   HENT: Negative.   Eyes: Negative.   Respiratory: Negative.   Cardiovascular: Negative.   Gastrointestinal: Negative.   Genitourinary: Positive for dysuria and urgency.  Musculoskeletal: Positive for myalgias, joint swelling and arthralgias.  Neurological: Positive for tremors. Negative for dizziness, syncope, speech difficulty, weakness, numbness and headaches.  Hematological: Negative.   Psychiatric/Behavioral: Negative.        Objective:   Physical Exam  Constitutional: He is oriented to person, place, and time. He appears well-developed and well-nourished.  HENT:  Right Ear: External ear normal.  Left Ear: External ear normal.  Nose: Nose normal.  Mouth/Throat: Oropharynx is clear and moist.  Eyes: EOM are normal. Pupils are equal, round, and reactive to light. No scleral icterus.  Neck: Normal range of motion. No thyromegaly present.  Cardiovascular: Normal rate, regular rhythm, normal heart sounds and intact distal pulses.   Pulmonary/Chest: Effort normal and breath sounds normal.  Abdominal: Soft. Bowel sounds are normal.  Musculoskeletal: He exhibits tenderness.  Lymphadenopathy:    He has no cervical adenopathy.  Neurological: He is alert and oriented to person, place, and time. He has normal reflexes. No cranial nerve deficit. He exhibits normal muscle tone. Coordination normal.  Skin: Skin is warm and dry.  Psychiatric: He has a normal mood and affect. His behavior is normal. Judgment and thought content normal.    Results for orders placed in visit on 08/13/12  POCT UA - MICROSCOPIC ONLY      Component Value  Range   WBC, Ur, HPF, POC neg     RBC, urine, microscopic neg     Bacteria, U Microscopic neg     Mucus, UA neg     Epithelial cells, urine per micros 0-1     Crystals, Ur, HPF, POC neg     Casts, Ur, LPF, POC neg     Yeast, UA neg    POCT URINALYSIS DIPSTICK      Component Value Range   Color, UA yellow     Clarity, UA clear     Glucose, UA neg     Bilirubin, UA neg     Ketones, UA neg     Spec Grav, UA 1.015     Blood, UA neg     pH, UA 7.0     Protein, UA neg     Urobilinogen, UA 0.2     Nitrite, UA neg     Leukocytes, UA Negative           Assessment & Plan:  Will consider neurology referral later Flu vac

## 2012-08-13 NOTE — Patient Instructions (Addendum)

## 2012-08-14 DIAGNOSIS — C61 Malignant neoplasm of prostate: Secondary | ICD-10-CM | POA: Diagnosis not present

## 2012-08-17 ENCOUNTER — Encounter: Payer: Self-pay | Admitting: *Deleted

## 2012-08-17 DIAGNOSIS — M7700 Medial epicondylitis, unspecified elbow: Secondary | ICD-10-CM | POA: Diagnosis not present

## 2012-08-24 DIAGNOSIS — C61 Malignant neoplasm of prostate: Secondary | ICD-10-CM | POA: Diagnosis not present

## 2012-08-24 DIAGNOSIS — M7700 Medial epicondylitis, unspecified elbow: Secondary | ICD-10-CM | POA: Diagnosis not present

## 2012-08-24 DIAGNOSIS — R310 Gross hematuria: Secondary | ICD-10-CM | POA: Diagnosis not present

## 2012-11-26 DIAGNOSIS — C61 Malignant neoplasm of prostate: Secondary | ICD-10-CM | POA: Diagnosis not present

## 2012-12-24 DIAGNOSIS — H16049 Marginal corneal ulcer, unspecified eye: Secondary | ICD-10-CM | POA: Diagnosis not present

## 2012-12-25 DIAGNOSIS — H16049 Marginal corneal ulcer, unspecified eye: Secondary | ICD-10-CM | POA: Diagnosis not present

## 2012-12-26 DIAGNOSIS — H16009 Unspecified corneal ulcer, unspecified eye: Secondary | ICD-10-CM | POA: Diagnosis not present

## 2012-12-27 DIAGNOSIS — H16049 Marginal corneal ulcer, unspecified eye: Secondary | ICD-10-CM | POA: Diagnosis not present

## 2012-12-31 DIAGNOSIS — H16019 Central corneal ulcer, unspecified eye: Secondary | ICD-10-CM | POA: Diagnosis not present

## 2013-01-10 DIAGNOSIS — H04129 Dry eye syndrome of unspecified lacrimal gland: Secondary | ICD-10-CM | POA: Diagnosis not present

## 2013-02-26 DIAGNOSIS — C61 Malignant neoplasm of prostate: Secondary | ICD-10-CM | POA: Diagnosis not present

## 2013-03-01 DIAGNOSIS — C61 Malignant neoplasm of prostate: Secondary | ICD-10-CM | POA: Diagnosis not present

## 2013-04-24 DIAGNOSIS — H16009 Unspecified corneal ulcer, unspecified eye: Secondary | ICD-10-CM | POA: Diagnosis not present

## 2013-04-24 DIAGNOSIS — H18219 Corneal edema secondary to contact lens, unspecified eye: Secondary | ICD-10-CM | POA: Diagnosis not present

## 2013-05-15 DIAGNOSIS — H18219 Corneal edema secondary to contact lens, unspecified eye: Secondary | ICD-10-CM | POA: Diagnosis not present

## 2013-05-24 DIAGNOSIS — H04129 Dry eye syndrome of unspecified lacrimal gland: Secondary | ICD-10-CM | POA: Diagnosis not present

## 2013-05-24 DIAGNOSIS — H5210 Myopia, unspecified eye: Secondary | ICD-10-CM | POA: Diagnosis not present

## 2013-05-24 DIAGNOSIS — H52229 Regular astigmatism, unspecified eye: Secondary | ICD-10-CM | POA: Diagnosis not present

## 2013-06-04 DIAGNOSIS — C61 Malignant neoplasm of prostate: Secondary | ICD-10-CM | POA: Diagnosis not present

## 2013-07-17 DIAGNOSIS — H40019 Open angle with borderline findings, low risk, unspecified eye: Secondary | ICD-10-CM | POA: Diagnosis not present

## 2013-08-02 ENCOUNTER — Encounter: Payer: Self-pay | Admitting: Internal Medicine

## 2013-08-15 ENCOUNTER — Ambulatory Visit (INDEPENDENT_AMBULATORY_CARE_PROVIDER_SITE_OTHER): Payer: Medicare Other | Admitting: Internal Medicine

## 2013-08-15 VITALS — BP 100/62 | HR 63 | Temp 98.1°F | Resp 16 | Ht 69.75 in | Wt 196.6 lb

## 2013-08-15 DIAGNOSIS — E782 Mixed hyperlipidemia: Secondary | ICD-10-CM | POA: Diagnosis not present

## 2013-08-15 DIAGNOSIS — C61 Malignant neoplasm of prostate: Secondary | ICD-10-CM | POA: Diagnosis not present

## 2013-08-15 DIAGNOSIS — Z23 Encounter for immunization: Secondary | ICD-10-CM | POA: Diagnosis not present

## 2013-08-15 DIAGNOSIS — Z139 Encounter for screening, unspecified: Secondary | ICD-10-CM

## 2013-08-15 DIAGNOSIS — Z0000 Encounter for general adult medical examination without abnormal findings: Secondary | ICD-10-CM

## 2013-08-15 DIAGNOSIS — I4891 Unspecified atrial fibrillation: Secondary | ICD-10-CM | POA: Diagnosis not present

## 2013-08-15 LAB — POCT UA - MICROSCOPIC ONLY
Mucus, UA: POSITIVE
RBC, urine, microscopic: 0

## 2013-08-15 LAB — COMPREHENSIVE METABOLIC PANEL
ALT: 16 U/L (ref 0–53)
CO2: 29 mEq/L (ref 19–32)
Calcium: 9.4 mg/dL (ref 8.4–10.5)
Chloride: 102 mEq/L (ref 96–112)
Creat: 1.03 mg/dL (ref 0.50–1.35)
Sodium: 141 mEq/L (ref 135–145)
Total Protein: 7.1 g/dL (ref 6.0–8.3)

## 2013-08-15 LAB — POCT CBC
Granulocyte percent: 54.9 %G (ref 37–80)
HCT, POC: 44.3 % (ref 43.5–53.7)
Hemoglobin: 13.8 g/dL — AB (ref 14.1–18.1)
POC Granulocyte: 2.3 (ref 2–6.9)
RBC: 4.61 M/uL — AB (ref 4.69–6.13)
RDW, POC: 13.9 %

## 2013-08-15 LAB — POCT URINALYSIS DIPSTICK
Bilirubin, UA: NEGATIVE
Ketones, UA: NEGATIVE
Leukocytes, UA: NEGATIVE
Spec Grav, UA: >=1.03
pH, UA: 5

## 2013-08-15 LAB — LIPID PANEL: Cholesterol: 197 mg/dL (ref 0–200)

## 2013-08-15 NOTE — Progress Notes (Signed)
Subjective:    Patient ID: [name] [name], male    DOB: 1940-10-30, 72 y.o.   MRN: [id number]  HPI Here feeling fine. For physical. Atrial fib successfully ablated, prostate cancer controlled. Exercises, is working, very active.    Review of Systems  Constitutional: Negative.   HENT: Negative.   Eyes: Negative.   Respiratory: Negative.   Cardiovascular: Negative.   Gastrointestinal: Negative.   Endocrine: Negative.   Genitourinary: Negative.   Musculoskeletal: Positive for arthralgias.  Skin: Negative.   Allergic/Immunologic: Negative.   Neurological: Negative.   Hematological: Negative.   Psychiatric/Behavioral: Negative.        Objective:   Physical Exam  Constitutional: He is oriented to person, place, and time. He appears well-developed and well-nourished.  HENT:  Head: Normocephalic.  Right Ear: External ear normal.  Left Ear: External ear normal.  Nose: Nose normal.  Mouth/Throat: Oropharynx is clear and moist.  Eyes: Conjunctivae and EOM are normal. Pupils are equal, round, and reactive to light.  Neck: Normal range of motion. Neck supple. No tracheal deviation present. No thyromegaly present.  Cardiovascular: Normal rate, regular rhythm and normal heart sounds.   Pulmonary/Chest: Effort normal and breath sounds normal.  Abdominal: Soft. Bowel sounds are normal. There is no tenderness.  Genitourinary: Penis normal.  Musculoskeletal: Normal range of motion.  Neurological: He is alert and oriented to person, place, and time. He has normal reflexes. No cranial nerve deficit. He exhibits normal muscle tone. Coordination normal.  Psychiatric: He has a normal mood and affect. His behavior is normal.   Results for orders placed in visit on 08/15/13  POCT CBC      Result Value Range   WBC 4.1 (*) 4.6 - 10.2 K/uL   Lymph, poc 1.4  0.6 - 3.4   POC LYMPH PERCENT 34.1  10 - 50 %L   MID (cbc) 0.5  0 - 0.9   POC MID % 11.0  0 - 12 %M   POC Granulocyte 2.3  2 - 6.9   Granulocyte percent 54.9  37 - 80 %G   RBC 4.61 (*) 4.69 - 6.13 M/uL   Hemoglobin 13.8 (*) 14.1 - 18.1 g/dL   HCT, POC 78.2  95.6 - 53.7 %   MCV 96.0  80 - 97 fL   MCH, POC 29.9  27 - 31.2 pg   MCHC 31.2 (*) 31.8 - 35.4 g/dL   RDW, POC 21.3     Platelet Count, POC 182  142 - 424 K/uL   MPV 9.4  0 - 99.8 fL  POCT UA - MICROSCOPIC ONLY      Result Value Range   WBC, Ur, HPF, POC 0-3     RBC, urine, microscopic 0     Bacteria, U Microscopic trace     Mucus, UA positive     Epithelial cells, urine per micros 0-1     Crystals, Ur, HPF, POC neg     Casts, Ur, LPF, POC neg     Yeast, UA neg    POCT URINALYSIS DIPSTICK      Result Value Range   Color, UA yellow     Clarity, UA clear     Glucose, UA neg     Bilirubin, UA neg     Ketones, UA neg     Spec Grav, UA >=1.030     Blood, UA neg     pH, UA 5.0     Protein, UA 30     Urobilinogen, UA  0.2     Nitrite, UA neg     Leukocytes, UA Negative            Assessment & Plan:  Healthy exam

## 2013-08-19 ENCOUNTER — Encounter: Payer: Self-pay | Admitting: Radiology

## 2013-09-16 DIAGNOSIS — C61 Malignant neoplasm of prostate: Secondary | ICD-10-CM | POA: Diagnosis not present

## 2013-09-20 DIAGNOSIS — C61 Malignant neoplasm of prostate: Secondary | ICD-10-CM | POA: Diagnosis not present

## 2013-12-19 DIAGNOSIS — C61 Malignant neoplasm of prostate: Secondary | ICD-10-CM | POA: Diagnosis not present

## 2014-03-24 DIAGNOSIS — C61 Malignant neoplasm of prostate: Secondary | ICD-10-CM | POA: Diagnosis not present

## 2014-03-28 DIAGNOSIS — C61 Malignant neoplasm of prostate: Secondary | ICD-10-CM | POA: Diagnosis not present

## 2014-05-16 ENCOUNTER — Encounter: Payer: Self-pay | Admitting: Internal Medicine

## 2014-06-30 DIAGNOSIS — H524 Presbyopia: Secondary | ICD-10-CM | POA: Diagnosis not present

## 2014-06-30 DIAGNOSIS — H2513 Age-related nuclear cataract, bilateral: Secondary | ICD-10-CM | POA: Diagnosis not present

## 2014-06-30 DIAGNOSIS — H52223 Regular astigmatism, bilateral: Secondary | ICD-10-CM | POA: Diagnosis not present

## 2014-06-30 DIAGNOSIS — H5213 Myopia, bilateral: Secondary | ICD-10-CM | POA: Diagnosis not present

## 2014-06-30 DIAGNOSIS — C61 Malignant neoplasm of prostate: Secondary | ICD-10-CM | POA: Diagnosis not present

## 2014-07-08 ENCOUNTER — Ambulatory Visit (INDEPENDENT_AMBULATORY_CARE_PROVIDER_SITE_OTHER): Payer: Medicare Other | Admitting: Internal Medicine

## 2014-07-08 ENCOUNTER — Ambulatory Visit (INDEPENDENT_AMBULATORY_CARE_PROVIDER_SITE_OTHER): Payer: Medicare Other

## 2014-07-08 VITALS — BP 132/78 | HR 62 | Temp 98.4°F | Resp 18 | Ht 69.25 in | Wt 194.0 lb

## 2014-07-08 DIAGNOSIS — M5442 Lumbago with sciatica, left side: Secondary | ICD-10-CM | POA: Diagnosis not present

## 2014-07-08 DIAGNOSIS — Z23 Encounter for immunization: Secondary | ICD-10-CM

## 2014-07-08 DIAGNOSIS — Z0000 Encounter for general adult medical examination without abnormal findings: Secondary | ICD-10-CM

## 2014-07-08 DIAGNOSIS — Z8679 Personal history of other diseases of the circulatory system: Secondary | ICD-10-CM

## 2014-07-08 DIAGNOSIS — Z8546 Personal history of malignant neoplasm of prostate: Secondary | ICD-10-CM

## 2014-07-08 LAB — POCT URINALYSIS DIPSTICK
BILIRUBIN UA: NEGATIVE
GLUCOSE UA: NEGATIVE
KETONES UA: NEGATIVE
Leukocytes, UA: NEGATIVE
NITRITE UA: NEGATIVE
PH UA: 5.5
Protein, UA: NEGATIVE
Spec Grav, UA: >=1.03
Urobilinogen, UA: 1

## 2014-07-08 LAB — POCT CBC
GRANULOCYTE PERCENT: 56.2 % (ref 37–80)
HEMATOCRIT: 46 % (ref 43.5–53.7)
HEMOGLOBIN: 14.8 g/dL (ref 14.1–18.1)
Lymph, poc: 1.6 (ref 0.6–3.4)
MCH, POC: 29.6 pg (ref 27–31.2)
MCHC: 32.1 g/dL (ref 31.8–35.4)
MCV: 92.3 fL (ref 80–97)
MID (cbc): 0.5 (ref 0–0.9)
MPV: 7.6 fL (ref 0–99.8)
POC GRANULOCYTE: 2.7 (ref 2–6.9)
POC LYMPH %: 34 % (ref 10–50)
POC MID %: 9.8 %M (ref 0–12)
Platelet Count, POC: 198 10*3/uL (ref 142–424)
RBC: 4.99 M/uL (ref 4.69–6.13)
RDW, POC: 14.3 %
WBC: 4.8 10*3/uL (ref 4.6–10.2)

## 2014-07-08 LAB — POCT UA - MICROSCOPIC ONLY
Casts, Ur, LPF, POC: NEGATIVE
Crystals, Ur, HPF, POC: NEGATIVE
MUCUS UA: POSITIVE
YEAST UA: NEGATIVE

## 2014-07-08 LAB — IFOBT (OCCULT BLOOD): IMMUNOLOGICAL FECAL OCCULT BLOOD TEST: POSITIVE

## 2014-07-08 MED ORDER — METHOCARBAMOL 750 MG PO TABS: 750.0000 mg | ORAL_TABLET | Freq: Four times a day (QID) | ORAL | Status: DC

## 2014-07-08 MED ORDER — PREDNISONE 20 MG PO TABS: ORAL_TABLET | ORAL | Status: DC

## 2014-07-08 NOTE — Progress Notes (Signed)
   Subjective:    Patient ID: [name] [name], male    DOB: [birth year], 72 y.o.   MRN: [id number]  HPI Prostate cancer slowly returning. Atrial fibrillation controlled. He does have recurrence of his LBP with left radiculopathy this time.  He has no weakness, numbness or incontinence. He likes to be active and play golf. Had flu shot and colonoscopy. Needs prevnar vaccine   Review of Systems  Constitutional: Negative.   HENT: Negative.   Eyes: Negative.   Respiratory: Negative.   Cardiovascular: Negative.   Gastrointestinal: Negative.   Endocrine: Negative.   Musculoskeletal: Positive for arthralgias and back pain. Negative for gait problem and joint swelling.  Skin: Negative.   Allergic/Immunologic: Negative.   Neurological: Negative.   Hematological: Negative.   Psychiatric/Behavioral: Negative.        Objective:   Physical Exam  Vitals reviewed. Constitutional: He is oriented to person, place, and time. He appears well-developed and well-nourished.  HENT:  Head: Normocephalic and atraumatic.  Right Ear: External ear normal.  Left Ear: External ear normal.  Nose: Nose normal.  Mouth/Throat: Oropharynx is clear and moist.  Eyes: Conjunctivae and EOM are normal. Pupils are equal, round, and reactive to light.  Neck: Normal range of motion. Neck supple. No tracheal deviation present. No thyromegaly present.  Cardiovascular: Normal rate, regular rhythm and normal heart sounds.  Exam reveals no gallop.   No murmur heard. Pulmonary/Chest: Effort normal and breath sounds normal. He exhibits no tenderness.  Abdominal: Soft. Bowel sounds are normal. He exhibits no mass.  Genitourinary: Rectum normal and penis normal.  Musculoskeletal: He exhibits tenderness.       Lumbar back: He exhibits decreased range of motion, tenderness, pain and spasm. He exhibits no bony tenderness, no swelling, no edema and normal pulse.       Arms: Neurological: He is alert and oriented to person, place,  and time. He has normal reflexes. No cranial nerve deficit. He exhibits normal muscle tone. Coordination normal.  Skin: No rash noted.  Psychiatric: He has a normal mood and affect. His behavior is normal. Judgment and thought content normal.    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

## 2014-07-08 NOTE — Patient Instructions (Addendum)
Immunization Information for Foreign Travel Immunizations can protect you from certain diseases. Immunizations can also prevent the spread of certain infections. It is important to see your caregiver or a travel medicine specialist 4-6 weeks before you travel. This allows time for vaccines to take effect. It also provides enough time for you to get vaccines that must be given in a series over a period of days or weeks. Immunizations for travelers include:  Routine vaccines. These vaccines are standard for the people in a country.  Recommended vaccines. These vaccines are recommended before travel to some countries or regions.  Required vaccines. These vaccines are necessary before travel to specific countries or regions. If it is less than 4 weeks before you leave, you should still see your caregiver. You might still benefit from vaccines or medicines. WHAT ARE THE ROUTINE VACCINES? Routine vaccines can protect you from diseases that are common in many parts of the world. Most routine vaccines are given at specific ages during your life. However, routine vaccines also include the annual flu (influenza) vaccine. You should be up to date on your routine immunizations before you travel. Your caregiver will be able to review your vaccine history and determine whether you have had all the routine vaccines. You may be advised to get extra doses or booster vaccines even if you are up to date on the routine vaccines. WHAT ARE THE RECOMMENDED VACCINES? Know your travel schedule when you visit your caregiver. The vaccines recommended before foreign travel will depend on several factors, including:  The country or countries of travel.  Whether you will travel to rural areas.  The length of time you will be traveling.  The season of the year.  Your age.  Your health status.  Your previous immunizations. Vaccine recommendations change over time. Your caregiver can tell you what vaccines are recommended  before your trip. The annual influenza vaccine sometimes differs for the [place] and [place] hemispheres. Unless the annual vaccines are the same in both hemispheres, people with certain chronic medical conditions who are traveling to the other hemisphere shortly before or during the influenza season should also get the other influenza vaccine. The other influenza vaccine should be obtained either before leaving the country or shortly after arrival at the travel site. WHAT ARE THE REQUIRED VACCINES? Vaccines may be required during a current outbreak of an infectious disease in a country or region. Your caregiver will be able to tell you about any current outbreaks and required vaccines. For example, proof of yellow fever immunization is currently required for most people before traveling to certain countries in [place] and [place]. This vaccine can only be obtained at approved centers. You should get the yellow fever vaccine at least 10 days before your trip. After 10 days, most people show immunity to yellow fever. If it has been longer than 10 years since you received the yellow fever vaccine, another dose is required. If proof of immunization is incomplete or inaccurate, you could be quarantined, denied entry, or given another dose of vaccine at the travel site. If you cannot receive the yellow fever vaccine because of medical reasons, you must have a written statement from your caregiver. The statement must contain a medical reason for the lack of immunization. In such a case, your caregiver should then give you advice on how to decrease your chance of getting yellow fever. That advice should include taking precautions to avoid mosquito bites and limiting outdoor time. Other than having a medical condition  or being under the age of 64 months, no other reasons will be accepted for not getting the vaccine.  Proof of meningococcal immunization is required by the [place] for any  person older than 2 years who is taking part in the [place] or [place]. Visas for traveling to the hajj or Marney Doctor will not even be issued until there is proof of immunization. You should get this vaccine at least 10 days before your trip. After 10 days, most people show immunity. If it has been longer than 3 years since your last immunization, another dose is required. FOR MORE INFORMATION  Centers for Disease Control and Prevention (CDC): http://www.wolf.info/  World Health Organization [facility]): RoleLink.com.br Document Released: 08/17/2009 Document Revised: 01/13/2014 Document Reviewed: 07/27/2012 [facility] Patient Information 2015 [place], [place]. This information is not intended to replace advice given to you by your health care provider. Make sure you discuss any questions you have with your health care provider. Dolor de espalda en el adulto (Back Pain, Adult)  El dolor de cintura es frecuente. Aproximadamente 1 de cada 5 personas lo sufren.La causa rara vez pone en peligro la vida. Con frecuencia mejora luego de algn tiempo.Alrededor de la mitad de las personas que sufren un inicio sbito de dolor de cintura, se sentirn mejor luego de 2 semanas. Aproximadamente 8 de cada 10 se sentirn mejor luego de 6 semanas.  CAUSAS  Algunas causas comunes son:   Distensin de los msculos o ligamentos que sostienen la columna vertebral.  Desgaste (degeneracin) de los discos vertebrales.  Artritis.  Traumatismos directos en la espalda. DIAGNSTICO  La mayor parte de las veces, la causa directa no se conoce.Sin embargo, Conservation officer, historic buildings puede tratarse efectivamente an cuando no se Community education officer.Una de las formas ms precisas de asegurar que la causa del dolor no constituye un peligro es responder a las preguntas del mdico acerca de su salud y sus sntomas. Si el mdico necesita ms informacin, podr indicar anlisis de laboratorio o Optometrist un diagnstico por imgenes (radiografas o Health visitor).Sin embargo,  aunque las [employer] modificaciones, generalmente no es necesaria la [place].  INSTRUCCIONES PARA EL CUIDADO EN EL HOGAR  En algunas personas, el dolor de espalda vuelve.Como rara vez es peligroso, los pacientes pueden aprender a Education administrator.   Mantngase activo. Si permanece sentado o de pie mucho tiempo en el mismo lugar, se tensiona la espalda.  No se siente, maneje ni se quede parado en un mismo lugar por ms de 30 minutos. Realice caminatas cortas en superficies planas ni bien el dolor haya cedido. Trate de Orthoptist tiempo que camina .  No se quede en la cama.Si hace reposo durante ms de 1 o 2 das, puede Geologist, engineering.  No evite los ejercicios ni el trabajo.El cuerpo est hecho para moverse.No es peligroso estar [place], aunque le duela la espalda.La espalda se curar ms rpido si contina sus actividades antes de que el dolor se vaya.  Preste atencin a su cuerpo cuando se incline y se levante. Muchas personas sienten menos molestias cuando levantan objetos si doblan las rodillas, mantienen la carga cerca del cuerpo y evitan torcerse. Generalmente, las posiciones ms cmodas son las que ejercen menos tensin en la espalda en recuperacin.  Encuentre una posicin cmoda para dormir. Utilice un colchn firme y recustese de [place]. Doble ligeramente sus rodillas. Si se recuesta sobre su espalda, coloque una almohada debajo de sus rodillas.  Tome slo medicamentos de venta [place]  o recetados, segn las indicaciones del mdico. Los medicamentos de venta libre para Glass blower/designer y reducir la inflamacin, son los que en general ms ayudan.El mdico podr prescribirle relajantes musculares.Estos medicamentos calman el dolor de modo que pueda retornar a sus actividades normales y a Marine scientist.  Aplique hielo sobre la zona lesionada.  Ponga el hielo en una bolsa plstica.  Colquese una toalla entre la piel y la bolsa de  hielo.  Deje la bolsa de hielo durante 15 a 20 minutos 3 a 4 veces por da, durante los primeros 2  3 das. Luego podr alternar Lyndal Pulley calor y 56 para reducir Conservation officer, historic buildings y los espasmos.  Consulte a su mdico si puede tratar de hacer ejercicios para la espalda y recibir un masaje suave. Pueden ser beneficiosos.  Evite sentirse ansioso o estresado.El estrs aumenta la tensin muscular y puede empeorar el dolor de espalda.Es importante reconocer cuando est ansioso o estresado y aprender la forma de controlarlos.El ejercicio es una gran opcin. SOLICITE ATENCIN MDICA SI:   Siente un dolor que no se alivia con reposo o medicamentos.  El dolor no mejora en 1 semana.  Desarrolla nuevos sntomas.  No se siente bien en general. SOLICITE ATENCIN MDICA DE INMEDIATO SI:  Siente un dolor que se irradia desde la espalda hacia sus piernas.  Desarrolla nuevos problemas en el intestino o la vejiga.  Siente debilidad o adormecimiento inusual en sus brazos o piernas.  Presenta nuseas o vmitos.  Presenta dolor abdominal.  Se siente desfalleciente. Document Released: 08/29/2005 Document Revised: 02/28/2012 [facility] Patient Information 2015 [place], [place]. This information is not intended to replace advice given to you by your health care provider. Make sure you discuss any questions you have with your health care provider.

## 2014-07-10 ENCOUNTER — Telehealth: Payer: Self-pay

## 2014-07-10 NOTE — Telephone Encounter (Signed)
Called pt to get details of medications for LBP because a PA was needed for methocarbamol. He stated that he just paid OOP for it, it was only $25. PA not needed.

## 2014-07-16 ENCOUNTER — Other Ambulatory Visit: Payer: Self-pay | Admitting: Neurosurgery

## 2014-07-16 DIAGNOSIS — M5136 Other intervertebral disc degeneration, lumbar region: Secondary | ICD-10-CM | POA: Insufficient documentation

## 2014-07-16 DIAGNOSIS — M5416 Radiculopathy, lumbar region: Secondary | ICD-10-CM

## 2014-07-22 ENCOUNTER — Ambulatory Visit
Admission: RE | Admit: 2014-07-22 | Discharge: 2014-07-22 | Disposition: A | Discharge status: Home / Self Care | Payer: Medicare Other | Source: Ambulatory Visit | Attending: Neurosurgery | Admitting: Neurosurgery

## 2014-07-22 DIAGNOSIS — M47817 Spondylosis without myelopathy or radiculopathy, lumbosacral region: Secondary | ICD-10-CM | POA: Diagnosis not present

## 2014-07-22 DIAGNOSIS — M5416 Radiculopathy, lumbar region: Secondary | ICD-10-CM

## 2014-07-22 DIAGNOSIS — Z8546 Personal history of malignant neoplasm of prostate: Secondary | ICD-10-CM | POA: Diagnosis not present

## 2014-07-29 DIAGNOSIS — M4800 Spinal stenosis, site unspecified: Secondary | ICD-10-CM | POA: Insufficient documentation

## 2014-07-29 DIAGNOSIS — Z6828 Body mass index (BMI) 28.0-28.9, adult: Secondary | ICD-10-CM | POA: Diagnosis not present

## 2014-07-29 DIAGNOSIS — M5136 Other intervertebral disc degeneration, lumbar region: Secondary | ICD-10-CM | POA: Diagnosis not present

## 2014-07-29 DIAGNOSIS — M4807 Spinal stenosis, lumbosacral region: Secondary | ICD-10-CM | POA: Diagnosis not present

## 2014-08-11 DIAGNOSIS — M47816 Spondylosis without myelopathy or radiculopathy, lumbar region: Secondary | ICD-10-CM | POA: Diagnosis not present

## 2014-08-11 DIAGNOSIS — M4716 Other spondylosis with myelopathy, lumbar region: Secondary | ICD-10-CM | POA: Insufficient documentation

## 2014-08-11 DIAGNOSIS — M5136 Other intervertebral disc degeneration, lumbar region: Secondary | ICD-10-CM | POA: Diagnosis not present

## 2014-08-11 DIAGNOSIS — M5416 Radiculopathy, lumbar region: Secondary | ICD-10-CM | POA: Diagnosis not present

## 2014-08-12 DIAGNOSIS — M791 Myalgia: Secondary | ICD-10-CM | POA: Diagnosis not present

## 2014-08-12 DIAGNOSIS — M7918 Myalgia, other site: Secondary | ICD-10-CM | POA: Insufficient documentation

## 2014-08-21 ENCOUNTER — Telehealth: Payer: Self-pay | Admitting: *Deleted

## 2014-08-21 NOTE — Telephone Encounter (Signed)
[name], for patient to come in to get flu shot.

## 2014-09-19 DIAGNOSIS — C61 Malignant neoplasm of prostate: Secondary | ICD-10-CM | POA: Diagnosis not present

## 2014-09-22 ENCOUNTER — Telehealth: Payer: Self-pay | Admitting: *Deleted

## 2014-09-22 NOTE — Telephone Encounter (Signed)
Pt stated he had the flu shot at his annual visit with Dr. Elder Cyphers.

## 2014-09-26 DIAGNOSIS — C61 Malignant neoplasm of prostate: Secondary | ICD-10-CM | POA: Diagnosis not present

## 2014-10-27 ENCOUNTER — Other Ambulatory Visit (HOSPITAL_COMMUNITY): Payer: Self-pay | Admitting: Urology

## 2014-10-27 DIAGNOSIS — C61 Malignant neoplasm of prostate: Secondary | ICD-10-CM

## 2014-11-07 ENCOUNTER — Encounter (HOSPITAL_COMMUNITY)
Admission: RE | Admit: 2014-11-07 | Discharge: 2014-11-07 | Disposition: A | Discharge status: Home / Self Care | Payer: Self-pay | Source: Ambulatory Visit | Attending: Urology | Admitting: Urology

## 2014-11-07 DIAGNOSIS — C61 Malignant neoplasm of prostate: Secondary | ICD-10-CM | POA: Insufficient documentation

## 2014-11-07 MED ORDER — TECHNETIUM TC 99M MEDRONATE IV KIT
26.5000 | PACK | Freq: Once | INTRAVENOUS | Status: AC | PRN
  Administered 2014-11-07: 26.5 via INTRAVENOUS

## 2014-11-10 ENCOUNTER — Ambulatory Visit: Payer: Medicare Other

## 2014-11-10 DIAGNOSIS — Z006 Encounter for examination for normal comparison and control in clinical research program: Secondary | ICD-10-CM

## 2014-11-10 NOTE — Progress Notes (Signed)
Visit is for an EKG for research. Sent patient with a copy of EKG and one EKG on file.

## 2014-11-17 ENCOUNTER — Encounter: Payer: Self-pay | Admitting: *Deleted

## 2014-11-17 DIAGNOSIS — N5231 Erectile dysfunction following radical prostatectomy: Secondary | ICD-10-CM | POA: Insufficient documentation

## 2014-11-18 ENCOUNTER — Encounter: Payer: Medicare Other | Admitting: Cardiothoracic Surgery

## 2014-11-18 ENCOUNTER — Other Ambulatory Visit: Payer: Self-pay | Admitting: *Deleted

## 2014-11-18 ENCOUNTER — Encounter: Payer: Self-pay | Admitting: Thoracic Surgery (Cardiothoracic Vascular Surgery)

## 2014-11-18 ENCOUNTER — Institutional Professional Consult (permissible substitution) (INDEPENDENT_AMBULATORY_CARE_PROVIDER_SITE_OTHER): Payer: Medicare Other | Admitting: Thoracic Surgery (Cardiothoracic Vascular Surgery)

## 2014-11-18 VITALS — BP 98/78 | HR 72 | Resp 16 | Ht 70.0 in | Wt 192.0 lb

## 2014-11-18 DIAGNOSIS — J9859 Other diseases of mediastinum, not elsewhere classified: Secondary | ICD-10-CM

## 2014-11-18 DIAGNOSIS — C61 Malignant neoplasm of prostate: Secondary | ICD-10-CM | POA: Diagnosis not present

## 2014-11-18 DIAGNOSIS — R222 Localized swelling, mass and lump, trunk: Secondary | ICD-10-CM | POA: Diagnosis not present

## 2014-11-18 DIAGNOSIS — I4891 Unspecified atrial fibrillation: Secondary | ICD-10-CM

## 2014-11-18 NOTE — Progress Notes (Signed)
PCP is GUEST, Veneda Melter, MD Referring Provider is Raynelle Bring, MD  Chief Complaint  Patient presents with  . Mediastinal Mass    CT CHEST/ABD/PELVIS @ [facility] 11/07/14.[name] KitchenMarland KitchenH/O PROSTATE CA    HPI: 74 year old gentleman sent for consultation regarding anterior Musella mass.  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 He recently saw Dr. Alinda Money for follow-up of that. His PSA was elevated and he has biochemically recurrent prostate cancer. He was being evaluated for the EMBARK trial. As part of that evaluation he had a CT scan of the chest, abdomen and pelvis. That showed an anterior mediastinal mass. He had a bone scan which was negative.  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 He plays golf on a regular basis. He still works 1 day a week. He feels well. He does say that he sometimes feels a sensation of discomfort behind the sternum. This is not exertional in nature. He has no history of coronary disease. He did have a history of atrial fibrillation and was treated with an ablation. He's not had any recurrent arrhythmias since his ablation. He denies shortness of breath. He's not had any weight loss. He denies visual disturbance, specifically has no double vision.  Zubrod Score: At the time of surgery this patient's most appropriate activity status/level should be described as: [x]     0    Normal activity, no symptoms []     1    Restricted in physical strenuous activity but ambulatory, able to do out light work []     2    Ambulatory and capable of self care, unable to do work activities, up and about >50 % of waking hours                              []     3    Only limited self care, in bed greater than 50% of waking hours []     4    Completely disabled, no self care, confined to bed or chair []     5    Moribund    Past Medical History  Diagnosis Date  . Atrial fibrillation   . Colon polyps     hyperplastic and  tubular adenomatous  . Thrombocytopenia   . Radiation     for prostate cancer  . Neuromuscular disorder   . Prostate cancer   . Erectile dysfunction following radical prostatectomy   . Mediastinal mass     per CT CHEST 11/07/14 @ [facility].[name] KitchenANTERIOR...4.2 X 3.3 cm soft tissue    Past Surgical History  Procedure Laterality Date  . Knee arthroscopy      left  . Tonsillectomy    . Back surgery    . Robot assisted laparoscopic radical prostatectomy  2009    radiation Tx 2011  . Vasectomy    . Colonoscopy    . Polypectomy    . Atrial ablation surgery      for a fib x 5 years ago  . Flexible sigmoidoscopy  02/10/2012    Procedure: FLEXIBLE SIGMOIDOSCOPY;  Surgeon: Irene Shipper, MD;  Location: [facility];  Service: Endoscopy;  Laterality: N/A;  need APC   . Prostate surgery      Family History  Problem Relation Age of Onset  . Prostate cancer Father   . Colon cancer Neg Hx   . Heart disease Mother  Social History History  Substance Use Topics  . Smoking status: Never Smoker   . Smokeless tobacco: Never Used  . Alcohol Use: No    Current Outpatient Prescriptions  Medication Sig Dispense Refill  . ibuprofen (ADVIL,MOTRIN) 200 MG tablet Take 400 mg by mouth every 6 (six) hours as needed. pain     No current facility-administered medications for this visit.    No Known Allergies  Review of Systems  Constitutional: Negative for activity change, appetite change and unexpected weight change.  Respiratory: Negative for cough and shortness of breath.   Cardiovascular: Positive for chest pain (vague discomfort behind sternum- not exertional) and palpitations (history of atrial fib- none since ablation). Negative for leg swelling.  Genitourinary: Positive for frequency.       History of prostate cancer  Hematological: Does not bruise/bleed easily.  All other systems reviewed and are negative.   BP 98/78 mmHg  Pulse 72  Resp 16  Ht 5\' 10"  (1.778  m)  Wt 192 lb (87.091 kg)  BMI 27.55 kg/m2  SpO2 96% Physical Exam  Constitutional: He is oriented to person, place, and time. He appears well-developed and well-nourished. No distress.  HENT:  Head: Normocephalic and atraumatic.  Eyes: EOM are normal. Pupils are equal, round, and reactive to light.  Neck: Neck supple. No thyromegaly present.  Cardiovascular: Normal rate, regular rhythm, normal heart sounds and intact distal pulses.  Exam reveals no gallop and no friction rub.   No murmur heard. Pulmonary/Chest: Effort normal and breath sounds normal. He has no wheezes. He has no rales.  Abdominal: Soft. There is no tenderness.  Musculoskeletal: He exhibits no edema.  Lymphadenopathy:    He has no cervical adenopathy.  Neurological: He is alert and oriented to person, place, and time. No cranial nerve deficit.  No focal motor deficit  Skin: Skin is warm and dry.  Vitals reviewed.    Diagnostic Tests: I reviewed the CT scan from [facility] done on 11/07/2014 There is a 5.1 x 4.2 x 3.3 cm soft tissue mass in the anterior mediastinum. This is well circumscribed with clear fat planes. There is some coronary artery calcification. There are small right lung nodules, likely granulomas.  Impression: 74 year old man with an incidentally noted 5.1 x 4.2 x 3.3 cm anterior mediastinal mass. This is most consistent with a thymoma. He does not have any history to suggest myasthenia gravis. On CT this appears to be a benign lesion with clear fat planes and no evidence of invasion.  We discussed potential diagnostic and treatment algorithms. One option would be to do a PET/CT. Given the size of the anterior mediastinal mass I think it should be resected. Therefore PET would not change treatment. We discussed potential operative approaches for resection. Those include video assisted thoracoscopic approach versus a sternotomy. We discussed unilateral versus bilateral  thoracoscopy.  After discussion of the indications, risks, benefits, and alternatives, Dr. Carlis Abbott wishes to proceed with a resection of the anterior mediastinal mass via a thoracoscopic approach. We will do this from the left side. He and Mrs. Boehne understand the general nature of the procedure, the incisions be used, the expected hospital stay, and overall recovery. He understands the risks include, but are not limited to death, MI, DVT, PE, bleeding, possible need for transfusion, infection, recurrence, possible need for sternotomy if there is invasion of surrounding structures, as well as the possibility of unforeseeable complications.   We discussed the potential use of cryo-analgesia of intercostal nerves  to help with postoperative pain control. We discussed the risks and benefits of that. He does wish to have cryo-analgesia time of his resection.  Plan: Left VATS, resection of anterior mediastinal mass, cryo-analgesia of intercostal nerves on Wednesday, March 16. He'll be admitted on the day of surgery   Melrose Nakayama, MD [facility] [phone]

## 2014-11-24 ENCOUNTER — Ambulatory Visit (HOSPITAL_COMMUNITY)
Admission: RE | Admit: 2014-11-24 | Discharge: 2014-11-24 | Disposition: A | Discharge status: Home / Self Care | Payer: Medicare Other | Source: Ambulatory Visit | Attending: Thoracic Surgery (Cardiothoracic Vascular Surgery) | Admitting: Thoracic Surgery (Cardiothoracic Vascular Surgery)

## 2014-11-24 ENCOUNTER — Encounter (HOSPITAL_COMMUNITY)
Admission: RE | Admit: 2014-11-24 | Discharge: 2014-11-24 | Disposition: A | Discharge status: Home / Self Care | Payer: Medicare Other | Source: Ambulatory Visit | Attending: Thoracic Surgery (Cardiothoracic Vascular Surgery) | Admitting: Thoracic Surgery (Cardiothoracic Vascular Surgery)

## 2014-11-24 ENCOUNTER — Encounter (HOSPITAL_COMMUNITY): Payer: Self-pay

## 2014-11-24 VITALS — BP 127/73 | HR 64 | Temp 97.5°F | Resp 18 | Ht 70.0 in | Wt 199.2 lb

## 2014-11-24 DIAGNOSIS — D696 Thrombocytopenia, unspecified: Secondary | ICD-10-CM | POA: Insufficient documentation

## 2014-11-24 DIAGNOSIS — Z923 Personal history of irradiation: Secondary | ICD-10-CM

## 2014-11-24 DIAGNOSIS — R350 Frequency of micturition: Secondary | ICD-10-CM

## 2014-11-24 DIAGNOSIS — R002 Palpitations: Secondary | ICD-10-CM | POA: Insufficient documentation

## 2014-11-24 DIAGNOSIS — Z01812 Encounter for preprocedural laboratory examination: Secondary | ICD-10-CM | POA: Insufficient documentation

## 2014-11-24 DIAGNOSIS — C61 Malignant neoplasm of prostate: Secondary | ICD-10-CM

## 2014-11-24 DIAGNOSIS — Z01818 Encounter for other preprocedural examination: Secondary | ICD-10-CM | POA: Diagnosis not present

## 2014-11-24 DIAGNOSIS — R222 Localized swelling, mass and lump, trunk: Secondary | ICD-10-CM

## 2014-11-24 DIAGNOSIS — J9859 Other diseases of mediastinum, not elsewhere classified: Secondary | ICD-10-CM

## 2014-11-24 HISTORY — DX: Cardiac arrhythmia, unspecified: I49.9

## 2014-11-24 LAB — SURGICAL PCR SCREEN
MRSA, PCR: NEGATIVE
Staphylococcus aureus: POSITIVE — AB

## 2014-11-24 LAB — COMPREHENSIVE METABOLIC PANEL
ALT: 19 U/L (ref 0–53)
ANION GAP: 5 (ref 5–15)
AST: 24 U/L (ref 0–37)
Albumin: 4 g/dL (ref 3.5–5.2)
Alkaline Phosphatase: 79 U/L (ref 39–117)
BUN: 20 mg/dL (ref 6–23)
CALCIUM: 9.6 mg/dL (ref 8.4–10.5)
CO2: 25 mmol/L (ref 19–32)
CREATININE: 0.76 mg/dL (ref 0.50–1.35)
Chloride: 107 mmol/L (ref 96–112)
GFR calc non Af Amer: 89 mL/min — ABNORMAL LOW (ref >90)
GLUCOSE: 83 mg/dL (ref 70–99)
Potassium: 4.3 mmol/L (ref 3.5–5.1)
SODIUM: 137 mmol/L (ref 135–145)
Total Bilirubin: 0.8 mg/dL (ref 0.3–1.2)
Total Protein: 7.1 g/dL (ref 6.0–8.3)

## 2014-11-24 LAB — CBC
HCT: 43.2 % (ref 39.0–52.0)
Hemoglobin: 14.7 g/dL (ref 13.0–17.0)
MCH: 30.4 pg (ref 26.0–34.0)
MCHC: 34 g/dL (ref 30.0–36.0)
MCV: 89.3 fL (ref 78.0–100.0)
PLATELETS: 188 10*3/uL (ref 150–400)
RBC: 4.84 MIL/uL (ref 4.22–5.81)
RDW: 13 % (ref 11.5–15.5)
WBC: 5 10*3/uL (ref 4.0–10.5)

## 2014-11-24 LAB — URINALYSIS, ROUTINE W REFLEX MICROSCOPIC
Bilirubin Urine: NEGATIVE
Glucose, UA: NEGATIVE mg/dL
Hgb urine dipstick: NEGATIVE
Ketones, ur: NEGATIVE mg/dL
LEUKOCYTES UA: NEGATIVE
Nitrite: NEGATIVE
PH: 6.5 (ref 5.0–8.0)
PROTEIN: NEGATIVE mg/dL
SPECIFIC GRAVITY, URINE: 1.021 (ref 1.005–1.030)
Urobilinogen, UA: 1 mg/dL (ref 0.0–1.0)

## 2014-11-24 LAB — BLOOD GAS, ARTERIAL
Acid-base deficit: 0.3 mmol/L (ref 0.0–2.0)
Bicarbonate: 23.6 mEq/L (ref 20.0–24.0)
DRAWN BY: 428831
FIO2: 0.21 %
O2 Saturation: 98.7 %
PATIENT TEMPERATURE: 98.6
PH ART: 7.422 (ref 7.350–7.450)
PO2 ART: 142 mmHg — AB (ref 80.0–100.0)
TCO2: 24.7 mmol/L (ref 0–100)
pCO2 arterial: 36.9 mmHg (ref 35.0–45.0)

## 2014-11-24 LAB — PROTIME-INR
INR: 1.19 (ref 0.00–1.49)
Prothrombin Time: 15.2 seconds (ref 11.6–15.2)

## 2014-11-24 LAB — APTT: aPTT: 35 seconds (ref 24–37)

## 2014-11-24 LAB — TYPE AND SCREEN
ABO/RH(D): A POS
Antibody Screen: NEGATIVE

## 2014-11-24 LAB — ABO/RH: ABO/RH(D): A POS

## 2014-11-24 NOTE — Progress Notes (Signed)
Anesthesia Chart Review:  Pt is 74 year old male scheduled for video assisted thoracoscopy, resection of anterior mediastinal mass, cryo intercostal nerve block on 11/26/2014 with Dr. Roxan Hockey.   PMH includes: atrial fibrillation (s/p ablation 8 years ago, no sx since), thrombocytopenia, prostate cancer. Never smoker. BMI 28.6  Preoperative labs reviewed.    Chest x-ray reviewed. Anterior superior mediastinal mass is subtle by plain radiography. No new cardiopulmonary abnormality.  EKG 11/10/2014: NSR.   If no changes, I anticipate pt can proceed with surgery as scheduled.   Willeen Cass, FNP-BC [facility] Short Stay Surgical Center/Anesthesiology Phone: [phone] 11/24/2014 4:07 PM

## 2014-11-24 NOTE — Pre-Procedure Instructions (Addendum)
[name] [name]  11/24/2014   Your procedure is scheduled on:  11/26/14  Report to [facility] short stay admitting at 58 AM.  Call this number if you have problems the morning of surgery: [phone]   Remember:   Do not eat food or drink liquids after midnight.   Take these medicines the morning of surgery with A SIP OF WATER: none     STOP all herbel meds, nsaids (aleve,naproxen,advil,ibuprofen) starting now including vitamins   Do not wear jewelry, make-up or nail polish.  Do not wear lotions, powders, or perfumes. You may wear deodorant.  Do not shave 48 hours prior to surgery. Men may shave face and neck.  Do not bring valuables to the hospital.  [facility] is not responsible                  for any belongings or valuables.               Contacts, dentures or bridgework may not be worn into surgery.  Leave suitcase in the car. After surgery it may be brought to your room.  For patients admitted to the hospital, discharge time is determined by your                treatment team.               Patients discharged the day of surgery will not be allowed to drive  home.  Name and phone number of your driver:   Special Instructions:  Special Instructions: [place] - Preparing for Surgery  Before surgery, you can play an important role.  Because skin is not sterile, your skin needs to be as free of germs as possible.  You can reduce the number of germs on you skin by washing with CHG (chlorahexidine gluconate) soap before surgery.  CHG is an antiseptic cleaner which kills germs and bonds with the skin to continue killing germs even after washing.  Please DO NOT use if you have an allergy to CHG or antibacterial soaps.  If your skin becomes reddened/irritated stop using the CHG and inform your nurse when you arrive at Short Stay.  Do not shave (including legs and underarms) for at least 48 hours prior to the first CHG shower.  You may shave your face.  Please follow these instructions  carefully:   1.  Shower with CHG Soap the night before surgery and the morning of Surgery.  2.  If you choose to wash your hair, wash your hair first as usual with your normal shampoo.  3.  After you shampoo, rinse your hair and body thoroughly to remove the Shampoo.  4.  Use CHG as you would any other liquid soap.  You can apply chg directly  to the skin and wash gently with scrungie or a clean washcloth.  5.  Apply the CHG Soap to your body ONLY FROM THE NECK DOWN.  Do not use on open wounds or open sores.  Avoid contact with your eyes ears, mouth and genitals (private parts).  Wash genitals (private parts)       with your normal soap.  6.  Wash thoroughly, paying special attention to the area where your surgery will be performed.  7.  Thoroughly rinse your body with warm water from the neck down.  8.  DO NOT shower/wash with your normal soap after using and rinsing off the CHG Soap.  9.  Pat yourself dry with a clean towel.  10.  Wear clean pajamas.            11.  Place clean sheets on your bed the night of your first shower and do not sleep with pets.  Day of Surgery  Do not apply any lotions/deodorants the morning of surgery.  Please wear clean clothes to the hospital/surgery center.   Please read over the following fact sheets that you were given: Pain Booklet, Coughing and Deep Breathing, Blood Transfusion Information, MRSA Information and Surgical Site Infection Prevention

## 2014-11-25 MED ORDER — DEXTROSE 5 % IV SOLN
1.5000 g | INTRAVENOUS | Status: AC
  Administered 2014-11-26: 1.5 g via INTRAVENOUS
  Filled 2014-11-25: qty 1.5

## 2014-11-26 ENCOUNTER — Inpatient Hospital Stay (HOSPITAL_COMMUNITY)
Admission: RE | Admit: 2014-11-26 | Discharge: 2014-11-30 | DRG: 179 | Disposition: A | Discharge status: Home / Self Care | Payer: Medicare Other | Source: Ambulatory Visit | Attending: Thoracic Surgery (Cardiothoracic Vascular Surgery) | Admitting: Thoracic Surgery (Cardiothoracic Vascular Surgery)

## 2014-11-26 ENCOUNTER — Inpatient Hospital Stay (HOSPITAL_COMMUNITY): Payer: Medicare Other | Admitting: Emergency Medicine

## 2014-11-26 ENCOUNTER — Inpatient Hospital Stay (HOSPITAL_COMMUNITY): Payer: Medicare Other | Admitting: Certified Registered Nurse Anesthetist

## 2014-11-26 ENCOUNTER — Encounter (HOSPITAL_COMMUNITY): Payer: Self-pay | Admitting: *Deleted

## 2014-11-26 ENCOUNTER — Inpatient Hospital Stay (HOSPITAL_COMMUNITY): Payer: Medicare Other

## 2014-11-26 ENCOUNTER — Encounter (HOSPITAL_COMMUNITY)
Admission: RE | Disposition: A | Discharge status: Home / Self Care | Payer: Self-pay | Source: Ambulatory Visit | Attending: Thoracic Surgery (Cardiothoracic Vascular Surgery)

## 2014-11-26 DIAGNOSIS — J985 Diseases of mediastinum, not elsewhere classified: Secondary | ICD-10-CM | POA: Diagnosis present

## 2014-11-26 DIAGNOSIS — D4989 Neoplasm of unspecified behavior of other specified sites: Secondary | ICD-10-CM | POA: Diagnosis not present

## 2014-11-26 DIAGNOSIS — Z01818 Encounter for other preprocedural examination: Secondary | ICD-10-CM

## 2014-11-26 DIAGNOSIS — Z86010 Personal history of colonic polyps: Secondary | ICD-10-CM | POA: Diagnosis not present

## 2014-11-26 DIAGNOSIS — R918 Other nonspecific abnormal finding of lung field: Secondary | ICD-10-CM | POA: Diagnosis not present

## 2014-11-26 DIAGNOSIS — Z9689 Presence of other specified functional implants: Secondary | ICD-10-CM

## 2014-11-26 DIAGNOSIS — I480 Paroxysmal atrial fibrillation: Secondary | ICD-10-CM | POA: Diagnosis not present

## 2014-11-26 DIAGNOSIS — Z8546 Personal history of malignant neoplasm of prostate: Secondary | ICD-10-CM

## 2014-11-26 DIAGNOSIS — C61 Malignant neoplasm of prostate: Secondary | ICD-10-CM | POA: Diagnosis present

## 2014-11-26 DIAGNOSIS — N99110 Postprocedural urethral stricture, male, meatal: Secondary | ICD-10-CM | POA: Diagnosis not present

## 2014-11-26 DIAGNOSIS — R0600 Dyspnea, unspecified: Secondary | ICD-10-CM

## 2014-11-26 DIAGNOSIS — J9859 Other diseases of mediastinum, not elsewhere classified: Secondary | ICD-10-CM

## 2014-11-26 DIAGNOSIS — Z8249 Family history of ischemic heart disease and other diseases of the circulatory system: Secondary | ICD-10-CM | POA: Diagnosis not present

## 2014-11-26 DIAGNOSIS — Z452 Encounter for adjustment and management of vascular access device: Secondary | ICD-10-CM | POA: Diagnosis not present

## 2014-11-26 DIAGNOSIS — E328 Other diseases of thymus: Secondary | ICD-10-CM | POA: Diagnosis not present

## 2014-11-26 DIAGNOSIS — J939 Pneumothorax, unspecified: Secondary | ICD-10-CM

## 2014-11-26 DIAGNOSIS — N320 Bladder-neck obstruction: Secondary | ICD-10-CM | POA: Diagnosis present

## 2014-11-26 DIAGNOSIS — Z9089 Acquired absence of other organs: Secondary | ICD-10-CM

## 2014-11-26 DIAGNOSIS — R222 Localized swelling, mass and lump, trunk: Secondary | ICD-10-CM | POA: Diagnosis not present

## 2014-11-26 DIAGNOSIS — Z923 Personal history of irradiation: Secondary | ICD-10-CM

## 2014-11-26 DIAGNOSIS — J9811 Atelectasis: Secondary | ICD-10-CM | POA: Diagnosis not present

## 2014-11-26 DIAGNOSIS — Z01812 Encounter for preprocedural laboratory examination: Secondary | ICD-10-CM

## 2014-11-26 DIAGNOSIS — Z8042 Family history of malignant neoplasm of prostate: Secondary | ICD-10-CM

## 2014-11-26 DIAGNOSIS — I4891 Unspecified atrial fibrillation: Secondary | ICD-10-CM | POA: Diagnosis not present

## 2014-11-26 DIAGNOSIS — Z4682 Encounter for fitting and adjustment of non-vascular catheter: Secondary | ICD-10-CM | POA: Diagnosis not present

## 2014-11-26 HISTORY — PX: VIDEO ASSISTED THORACOSCOPY: SHX5073

## 2014-11-26 HISTORY — PX: CYSTOSCOPY WITH URETHRAL DILATATION: SHX5125

## 2014-11-26 HISTORY — PX: CYSTOSCOPY: SHX5120

## 2014-11-26 HISTORY — PX: RESECTION OF MEDIASTINAL MASS: SHX6497

## 2014-11-26 HISTORY — PX: LEAD REMOVAL: SHX5944

## 2014-11-26 SURGERY — VIDEO ASSISTED THORACOSCOPY
Anesthesia: General | Site: Penis

## 2014-11-26 MED ORDER — CHLORHEXIDINE GLUCONATE CLOTH 2 % EX PADS
6.0000 | MEDICATED_PAD | Freq: Every day | CUTANEOUS | Status: DC
  Administered 2014-11-27 – 2014-11-29 (×3): 6 via TOPICAL

## 2014-11-26 MED ORDER — DIPHENHYDRAMINE HCL 12.5 MG/5ML PO ELIX
12.5000 mg | ORAL_SOLUTION | Freq: Four times a day (QID) | ORAL | Status: DC | PRN
  Filled 2014-11-26: qty 5

## 2014-11-26 MED ORDER — FENTANYL CITRATE 0.05 MG/ML IJ SOLN
INTRAMUSCULAR | Status: DC | PRN
  Administered 2014-11-26 (×5): 50 ug via INTRAVENOUS

## 2014-11-26 MED ORDER — POTASSIUM CHLORIDE 10 MEQ/50ML IV SOLN
10.0000 meq | Freq: Every day | INTRAVENOUS | Status: DC | PRN
  Filled 2014-11-26: qty 50

## 2014-11-26 MED ORDER — LACTATED RINGERS IV SOLN
INTRAVENOUS | Status: DC | PRN
  Administered 2014-11-26: 08:00:00 via INTRAVENOUS

## 2014-11-26 MED ORDER — ONDANSETRON HCL 4 MG/2ML IJ SOLN
INTRAMUSCULAR | Status: DC | PRN
  Administered 2014-11-26: 4 mg via INTRAVENOUS

## 2014-11-26 MED ORDER — ACETAMINOPHEN 500 MG PO TABS
1000.0000 mg | ORAL_TABLET | Freq: Four times a day (QID) | ORAL | Status: DC
  Administered 2014-11-26 – 2014-11-30 (×10): 1000 mg via ORAL
  Filled 2014-11-26 (×18): qty 2

## 2014-11-26 MED ORDER — 0.9 % SODIUM CHLORIDE (POUR BTL) OPTIME
TOPICAL | Status: DC | PRN
  Administered 2014-11-26: 2000 mL

## 2014-11-26 MED ORDER — ONDANSETRON HCL 4 MG/2ML IJ SOLN: 4.0000 mg | Freq: Four times a day (QID) | INTRAMUSCULAR | Status: DC | PRN

## 2014-11-26 MED ORDER — OXYCODONE HCL 5 MG/5ML PO SOLN: 5.0000 mg | Freq: Once | ORAL | Status: DC | PRN

## 2014-11-26 MED ORDER — MUPIROCIN 2 % EX OINT
1.0000 "application " | TOPICAL_OINTMENT | Freq: Two times a day (BID) | CUTANEOUS | Status: DC
  Administered 2014-11-26 – 2014-11-30 (×7): 1 via NASAL
  Filled 2014-11-26: qty 22

## 2014-11-26 MED ORDER — LIDOCAINE HCL 2 % EX GEL
CUTANEOUS | Status: DC | PRN
  Administered 2014-11-26: 1 via URETHRAL

## 2014-11-26 MED ORDER — FENTANYL 10 MCG/ML IV SOLN
INTRAVENOUS | Status: DC
  Administered 2014-11-26: 17:00:00 via INTRAVENOUS
  Administered 2014-11-26: 165 ug via INTRAVENOUS
  Administered 2014-11-27: 120 ug via INTRAVENOUS
  Administered 2014-11-27: 180 ug via INTRAVENOUS
  Administered 2014-11-27: 163 ug via INTRAVENOUS
  Administered 2014-11-27: 06:00:00 via INTRAVENOUS
  Administered 2014-11-27: 256 ug via INTRAVENOUS
  Administered 2014-11-27: 105 ug via INTRAVENOUS
  Administered 2014-11-27: 17:00:00 via INTRAVENOUS
  Administered 2014-11-28: 30 ug via INTRAVENOUS
  Administered 2014-11-28: 105 ug via INTRAVENOUS
  Filled 2014-11-26 (×3): qty 50

## 2014-11-26 MED ORDER — HYDROMORPHONE HCL 1 MG/ML IJ SOLN
INTRAMUSCULAR | Status: AC
  Filled 2014-11-26: qty 1

## 2014-11-26 MED ORDER — HYDROMORPHONE HCL 1 MG/ML IJ SOLN
0.2500 mg | INTRAMUSCULAR | Status: DC | PRN
  Administered 2014-11-26 (×2): 0.5 mg via INTRAVENOUS

## 2014-11-26 MED ORDER — DIPHENHYDRAMINE HCL 50 MG/ML IJ SOLN: 12.5000 mg | Freq: Four times a day (QID) | INTRAMUSCULAR | Status: DC | PRN

## 2014-11-26 MED ORDER — MIDAZOLAM HCL 2 MG/2ML IJ SOLN
INTRAMUSCULAR | Status: AC
  Filled 2014-11-26: qty 2

## 2014-11-26 MED ORDER — OXYCODONE HCL 5 MG PO TABS
5.0000 mg | ORAL_TABLET | ORAL | Status: DC | PRN
  Administered 2014-11-26: 10 mg via ORAL
  Filled 2014-11-26: qty 2

## 2014-11-26 MED ORDER — SENNOSIDES-DOCUSATE SODIUM 8.6-50 MG PO TABS
1.0000 | ORAL_TABLET | Freq: Every day | ORAL | Status: DC
Start: 2014-11-26 — End: 2014-11-30
  Administered 2014-11-26 – 2014-11-29 (×3): 1 via ORAL
  Filled 2014-11-26 (×5): qty 1

## 2014-11-26 MED ORDER — MUPIROCIN 2 % EX OINT
TOPICAL_OINTMENT | CUTANEOUS | Status: AC
  Filled 2014-11-26: qty 22

## 2014-11-26 MED ORDER — PROPOFOL 10 MG/ML IV BOLUS
INTRAVENOUS | Status: DC | PRN
Start: 2014-11-26 — End: 2014-11-26
  Administered 2014-11-26: 200 mg via INTRAVENOUS

## 2014-11-26 MED ORDER — GLYCOPYRROLATE 0.2 MG/ML IJ SOLN
INTRAMUSCULAR | Status: DC | PRN
  Administered 2014-11-26: .8 mg via INTRAVENOUS
  Administered 2014-11-26: 0.2 mg via INTRAVENOUS

## 2014-11-26 MED ORDER — DEXTROSE 5 % IV SOLN
1.5000 g | Freq: Two times a day (BID) | INTRAVENOUS | Status: AC
  Administered 2014-11-27 (×2): 1.5 g via INTRAVENOUS
  Filled 2014-11-26 (×2): qty 1.5

## 2014-11-26 MED ORDER — MEPERIDINE HCL 25 MG/ML IJ SOLN: 6.2500 mg | INTRAMUSCULAR | Status: DC | PRN

## 2014-11-26 MED ORDER — DEXTROSE-NACL 5-0.9 % IV SOLN
INTRAVENOUS | Status: DC
  Administered 2014-11-26 – 2014-11-27 (×3): via INTRAVENOUS

## 2014-11-26 MED ORDER — OXYCODONE HCL 5 MG PO TABS: 5.0000 mg | ORAL_TABLET | Freq: Once | ORAL | Status: DC | PRN

## 2014-11-26 MED ORDER — NALOXONE HCL 0.4 MG/ML IJ SOLN: 0.4000 mg | INTRAMUSCULAR | Status: DC | PRN

## 2014-11-26 MED ORDER — PHENYLEPHRINE HCL 10 MG/ML IJ SOLN
10.0000 mg | INTRAVENOUS | Status: DC | PRN
  Administered 2014-11-26: 10 ug/min via INTRAVENOUS

## 2014-11-26 MED ORDER — NEOSTIGMINE METHYLSULFATE 10 MG/10ML IV SOLN
INTRAVENOUS | Status: DC | PRN
  Administered 2014-11-26: 5 mg via INTRAVENOUS

## 2014-11-26 MED ORDER — KETOROLAC TROMETHAMINE 30 MG/ML IJ SOLN
30.0000 mg | Freq: Once | INTRAMUSCULAR | Status: AC
  Administered 2014-11-26: 30 mg via INTRAVENOUS
  Filled 2014-11-26 (×2): qty 1

## 2014-11-26 MED ORDER — EPHEDRINE SULFATE 50 MG/ML IJ SOLN
INTRAMUSCULAR | Status: DC | PRN
  Administered 2014-11-26: 10 mg via INTRAVENOUS

## 2014-11-26 MED ORDER — FENTANYL CITRATE 0.05 MG/ML IJ SOLN
INTRAMUSCULAR | Status: AC
  Filled 2014-11-26: qty 5

## 2014-11-26 MED ORDER — TRAMADOL HCL 50 MG PO TABS: 50.0000 mg | ORAL_TABLET | Freq: Four times a day (QID) | ORAL | Status: DC | PRN

## 2014-11-26 MED ORDER — ROCURONIUM BROMIDE 100 MG/10ML IV SOLN
INTRAVENOUS | Status: DC | PRN
  Administered 2014-11-26: 50 mg via INTRAVENOUS

## 2014-11-26 MED ORDER — VECURONIUM BROMIDE 10 MG IV SOLR
INTRAVENOUS | Status: DC | PRN
  Administered 2014-11-26 (×3): 1 mg via INTRAVENOUS
  Administered 2014-11-26: 2 mg via INTRAVENOUS
  Administered 2014-11-26: 1 mg via INTRAVENOUS
  Administered 2014-11-26: 2 mg via INTRAVENOUS

## 2014-11-26 MED ORDER — STERILE WATER FOR IRRIGATION IR SOLN
Status: DC | PRN
  Administered 2014-11-26: 2000 mL

## 2014-11-26 MED ORDER — BISACODYL 5 MG PO TBEC
10.0000 mg | DELAYED_RELEASE_TABLET | Freq: Every day | ORAL | Status: DC
  Administered 2014-11-27 – 2014-11-30 (×4): 10 mg via ORAL
  Filled 2014-11-26 (×4): qty 2

## 2014-11-26 MED ORDER — PROPOFOL 10 MG/ML IV BOLUS
INTRAVENOUS | Status: AC
  Filled 2014-11-26: qty 20

## 2014-11-26 MED ORDER — SODIUM CHLORIDE 0.9 % IJ SOLN: 9.0000 mL | INTRAMUSCULAR | Status: DC | PRN

## 2014-11-26 MED ORDER — LACTATED RINGERS IV SOLN
INTRAVENOUS | Status: DC | PRN
  Administered 2014-11-26 (×2): via INTRAVENOUS

## 2014-11-26 MED ORDER — HEMOSTATIC AGENTS (NO CHARGE) OPTIME
TOPICAL | Status: DC | PRN
  Administered 2014-11-26: 1 via TOPICAL

## 2014-11-26 MED ORDER — MIDAZOLAM HCL 5 MG/5ML IJ SOLN
INTRAMUSCULAR | Status: DC | PRN
  Administered 2014-11-26 (×2): 1 mg via INTRAVENOUS

## 2014-11-26 MED ORDER — DEXTROSE 5 % IV SOLN
1.5000 g | INTRAVENOUS | Status: AC
  Administered 2014-11-26: 1.5 g via INTRAVENOUS
  Filled 2014-11-26: qty 1.5

## 2014-11-26 MED ORDER — DEXAMETHASONE SODIUM PHOSPHATE 4 MG/ML IJ SOLN
INTRAMUSCULAR | Status: DC | PRN
  Administered 2014-11-26: 4 mg via INTRAVENOUS

## 2014-11-26 MED ORDER — ONDANSETRON HCL 4 MG/2ML IJ SOLN: 4.0000 mg | Freq: Once | INTRAMUSCULAR | Status: DC | PRN

## 2014-11-26 MED ORDER — ACETAMINOPHEN 160 MG/5ML PO SOLN
1000.0000 mg | Freq: Four times a day (QID) | ORAL | Status: DC
  Administered 2014-11-28: 1000 mg via ORAL
  Filled 2014-11-26: qty 40

## 2014-11-26 SURGICAL SUPPLY — 99 items
APPLIER CLIP 5 13 M/L LIGAMAX5 (MISCELLANEOUS) ×10
APPLIER CLIP ROT 10 11.4 M/L (STAPLE)
BAG URINE DRAINAGE (UROLOGICAL SUPPLIES) ×5 IMPLANT
BALLN NEPHROSTOMY (BALLOONS) ×5
BALLOON NEPHROSTOMY (BALLOONS) ×3 IMPLANT
CANISTER SUCTION 2500CC (MISCELLANEOUS) ×5 IMPLANT
CATH COUDE FOLEY 2W 5CC 16FR (CATHETERS) ×5 IMPLANT
CATH COUDE FOLEY 5CC 14FR (CATHETERS) ×5 IMPLANT
CATH FOLEY 2W COUNCIL 5CC 16FR (CATHETERS) ×5 IMPLANT
CATH KIT ON Q 5IN SLV (PAIN MANAGEMENT) IMPLANT
CATH THORACIC 28FR (CATHETERS) IMPLANT
CATH THORACIC 28FR RT ANG (CATHETERS) IMPLANT
CATH THORACIC 36FR (CATHETERS) IMPLANT
CATH THORACIC 36FR RT ANG (CATHETERS) IMPLANT
CLIP APPLIE 5 13 M/L LIGAMAX5 (MISCELLANEOUS) ×6 IMPLANT
CLIP APPLIE ROT 10 11.4 M/L (STAPLE) IMPLANT
CLIP TI MEDIUM 6 (CLIP) ×5 IMPLANT
CONN ST 1/4X3/8  BEN (MISCELLANEOUS) ×2
CONN ST 1/4X3/8 BEN (MISCELLANEOUS) ×3 IMPLANT
CONN Y 3/8X3/8X3/8  BEN (MISCELLANEOUS) ×2
CONN Y 3/8X3/8X3/8 BEN (MISCELLANEOUS) ×3 IMPLANT
CONT SPEC 4OZ CLIKSEAL STRL BL (MISCELLANEOUS) ×10 IMPLANT
COVER SURGICAL LIGHT HANDLE (MISCELLANEOUS) ×5 IMPLANT
DERMABOND ADVANCED (GAUZE/BANDAGES/DRESSINGS)
DERMABOND ADVANCED .7 DNX12 (GAUZE/BANDAGES/DRESSINGS) IMPLANT
DRAIN CHANNEL 28F RND 3/8 FF (WOUND CARE) ×5 IMPLANT
DRAPE C-ARM 42X72 X-RAY (DRAPES) ×5 IMPLANT
DRAPE LAPAROSCOPIC ABDOMINAL (DRAPES) ×5 IMPLANT
DRAPE PROXIMA HALF (DRAPES) ×5 IMPLANT
DRAPE WARM FLUID 44X44 (DRAPE) ×5 IMPLANT
ELECT BLADE 6.5 EXT (BLADE) ×5 IMPLANT
ELECT REM PT RETURN 9FT ADLT (ELECTROSURGICAL) ×5
ELECTRODE REM PT RTRN 9FT ADLT (ELECTROSURGICAL) ×3 IMPLANT
GAUZE SPONGE 4X4 12PLY STRL (GAUZE/BANDAGES/DRESSINGS) ×5 IMPLANT
GLOVE BIO SURGEON STRL SZ 6.5 (GLOVE) ×4 IMPLANT
GLOVE BIO SURGEON STRL SZ7 (GLOVE) ×5 IMPLANT
GLOVE BIO SURGEON STRL SZ7.5 (GLOVE) ×5 IMPLANT
GLOVE BIO SURGEONS STRL SZ 6.5 (GLOVE) ×1
GLOVE BIOGEL PI IND STRL 6.5 (GLOVE) ×3 IMPLANT
GLOVE BIOGEL PI INDICATOR 6.5 (GLOVE) ×2
GLOVE EUDERMIC 7 POWDERFREE (GLOVE) ×5 IMPLANT
GLOVE SURG SIGNA 7.5 PF LTX (GLOVE) ×10 IMPLANT
GOWN STRL REUS W/ TWL LRG LVL3 (GOWN DISPOSABLE) ×6 IMPLANT
GOWN STRL REUS W/ TWL XL LVL3 (GOWN DISPOSABLE) ×3 IMPLANT
GOWN STRL REUS W/TWL LRG LVL3 (GOWN DISPOSABLE) ×4
GOWN STRL REUS W/TWL XL LVL3 (GOWN DISPOSABLE) ×2
GUIDEWIRE STR DUAL SENSOR (WIRE) ×5 IMPLANT
HANDLE STAPLE ENDO GIA SHORT (STAPLE)
HEMOSTAT SURGICEL 2X14 (HEMOSTASIS) ×5 IMPLANT
KIT BASIN OR (CUSTOM PROCEDURE TRAY) ×5 IMPLANT
KIT ROOM TURNOVER OR (KITS) ×5 IMPLANT
KIT SUCTION CATH 14FR (SUCTIONS) ×5 IMPLANT
LIQUID BAND (GAUZE/BANDAGES/DRESSINGS) ×5 IMPLANT
NS IRRIG 1000ML POUR BTL (IV SOLUTION) ×10 IMPLANT
PACK CHEST (CUSTOM PROCEDURE TRAY) ×5 IMPLANT
PAD ARMBOARD 7.5X6 YLW CONV (MISCELLANEOUS) ×10 IMPLANT
POUCH ENDO CATCH II 15MM (MISCELLANEOUS) IMPLANT
POUCH SPECIMEN RETRIEVAL 10MM (ENDOMECHANICALS) ×5 IMPLANT
RUBBERBAND STERILE (MISCELLANEOUS) ×5 IMPLANT
SEALANT PROGEL (MISCELLANEOUS) IMPLANT
SEALANT SURG COSEAL 4ML (VASCULAR PRODUCTS) IMPLANT
SEALANT SURG COSEAL 8ML (VASCULAR PRODUCTS) IMPLANT
SET CYSTO W/LG BORE CLAMP LF (SET/KITS/TRAYS/PACK) ×5 IMPLANT
SHEARS HARMONIC ACE PLUS 36CM (ENDOMECHANICALS) ×5 IMPLANT
SOLUTION ANTI FOG 6CC (MISCELLANEOUS) ×5 IMPLANT
SPECIMEN JAR MEDIUM (MISCELLANEOUS) ×5 IMPLANT
SPONGE INTESTINAL PEANUT (DISPOSABLE) ×45 IMPLANT
STAPLER ENDO GIA 12MM SHORT (STAPLE) IMPLANT
SUT PROLENE 4 0 RB 1 (SUTURE)
SUT PROLENE 4-0 RB1 .5 CRCL 36 (SUTURE) IMPLANT
SUT SILK  1 MH (SUTURE) ×4
SUT SILK 1 MH (SUTURE) ×6 IMPLANT
SUT SILK 1 TIES 10X30 (SUTURE) ×5 IMPLANT
SUT SILK 2 0 TIES 10X30 (SUTURE) ×5 IMPLANT
SUT SILK 2 0SH CR/8 30 (SUTURE) IMPLANT
SUT SILK 3 0SH CR/8 30 (SUTURE) IMPLANT
SUT SILK 4 0 TIE 10X30 (SUTURE) ×5 IMPLANT
SUT VIC AB 1 CTX 36 (SUTURE) ×2
SUT VIC AB 1 CTX36XBRD ANBCTR (SUTURE) ×3 IMPLANT
SUT VIC AB 2-0 CTX 36 (SUTURE) ×5 IMPLANT
SUT VIC AB 2-0 UR6 27 (SUTURE) IMPLANT
SUT VIC AB 3-0 MH 27 (SUTURE) IMPLANT
SUT VIC AB 3-0 X1 27 (SUTURE) ×10 IMPLANT
SUT VICRYL 2 TP 1 (SUTURE) IMPLANT
SWAB COLLECTION DEVICE MRSA (MISCELLANEOUS) IMPLANT
SYR TOOMEY 50ML (SYRINGE) ×5 IMPLANT
SYSTEM SAHARA CHEST DRAIN ATS (WOUND CARE) ×5 IMPLANT
TAPE CLOTH 4X10 WHT NS (GAUZE/BANDAGES/DRESSINGS) ×5 IMPLANT
TAPE CLOTH SURG 4X10 WHT LF (GAUZE/BANDAGES/DRESSINGS) ×5 IMPLANT
TIP APPLICATOR SPRAY EXTEND 16 (VASCULAR PRODUCTS) IMPLANT
TOWEL OR 17X24 6PK STRL BLUE (TOWEL DISPOSABLE) ×10 IMPLANT
TOWEL OR 17X26 10 PK STRL BLUE (TOWEL DISPOSABLE) ×10 IMPLANT
TRAP SPECIMEN MUCOUS 40CC (MISCELLANEOUS) IMPLANT
TRAY FOLEY CATH 16FRSI W/METER (SET/KITS/TRAYS/PACK) ×5 IMPLANT
TROCAR XCEL BLADELESS 5X75MML (TROCAR) ×15 IMPLANT
TROCAR XCEL NON-BLD 5MMX100MML (ENDOMECHANICALS) IMPLANT
TUBE ANAEROBIC SPECIMEN COL (MISCELLANEOUS) IMPLANT
TUNNELER SHEATH ON-Q 11GX8 DSP (PAIN MANAGEMENT) IMPLANT
WATER STERILE IRR 1000ML POUR (IV SOLUTION) ×10 IMPLANT

## 2014-11-26 NOTE — H&P (View-Only) (Signed)
PCP is GUEST, Veneda Melter, MD Referring Provider is Raynelle Bring, MD  Chief Complaint  Patient presents with  . Mediastinal Mass    CT CHEST/ABD/PELVIS @ [facility] 11/07/14.[name] KitchenMarland KitchenH/O PROSTATE CA    HPI: 74 year old gentleman sent for consultation regarding anterior Musella mass.  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 He recently saw Dr. Alinda Money for follow-up of that. His PSA was elevated and he has biochemically recurrent prostate cancer. He was being evaluated for the EMBARK trial. As part of that evaluation he had a CT scan of the chest, abdomen and pelvis. That showed an anterior mediastinal mass. He had a bone scan which was negative.  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 He plays golf on a regular basis. He still works 1 day a week. He feels well. He does say that he sometimes feels a sensation of discomfort behind the sternum. This is not exertional in nature. He has no history of coronary disease. He did have a history of atrial fibrillation and was treated with an ablation. He's not had any recurrent arrhythmias since his ablation. He denies shortness of breath. He's not had any weight loss. He denies visual disturbance, specifically has no double vision.  Zubrod Score: At the time of surgery this patient's most appropriate activity status/level should be described as: [x]     0    Normal activity, no symptoms []     1    Restricted in physical strenuous activity but ambulatory, able to do out light work []     2    Ambulatory and capable of self care, unable to do work activities, up and about >50 % of waking hours                              []     3    Only limited self care, in bed greater than 50% of waking hours []     4    Completely disabled, no self care, confined to bed or chair []     5    Moribund    Past Medical History  Diagnosis Date  . Atrial fibrillation   . Colon polyps     hyperplastic and  tubular adenomatous  . Thrombocytopenia   . Radiation     for prostate cancer  . Neuromuscular disorder   . Prostate cancer   . Erectile dysfunction following radical prostatectomy   . Mediastinal mass     per CT CHEST 11/07/14 @ [facility].[name] KitchenANTERIOR...4.2 X 3.3 cm soft tissue    Past Surgical History  Procedure Laterality Date  . Knee arthroscopy      left  . Tonsillectomy    . Back surgery    . Robot assisted laparoscopic radical prostatectomy  2009    radiation Tx 2011  . Vasectomy    . Colonoscopy    . Polypectomy    . Atrial ablation surgery      for a fib x 5 years ago  . Flexible sigmoidoscopy  02/10/2012    Procedure: FLEXIBLE SIGMOIDOSCOPY;  Surgeon: Irene Shipper, MD;  Location: [facility];  Service: Endoscopy;  Laterality: N/A;  need APC   . Prostate surgery      Family History  Problem Relation Age of Onset  . Prostate cancer Father   . Colon cancer Neg Hx   . Heart disease Mother  Social History History  Substance Use Topics  . Smoking status: Never Smoker   . Smokeless tobacco: Never Used  . Alcohol Use: No    Current Outpatient Prescriptions  Medication Sig Dispense Refill  . ibuprofen (ADVIL,MOTRIN) 200 MG tablet Take 400 mg by mouth every 6 (six) hours as needed. pain     No current facility-administered medications for this visit.    No Known Allergies  Review of Systems  Constitutional: Negative for activity change, appetite change and unexpected weight change.  Respiratory: Negative for cough and shortness of breath.   Cardiovascular: Positive for chest pain (vague discomfort behind sternum- not exertional) and palpitations (history of atrial fib- none since ablation). Negative for leg swelling.  Genitourinary: Positive for frequency.       History of prostate cancer  Hematological: Does not bruise/bleed easily.  All other systems reviewed and are negative.   BP 98/78 mmHg  Pulse 72  Resp 16  Ht 5\' 10"  (1.778  m)  Wt 192 lb (87.091 kg)  BMI 27.55 kg/m2  SpO2 96% Physical Exam  Constitutional: He is oriented to person, place, and time. He appears well-developed and well-nourished. No distress.  HENT:  Head: Normocephalic and atraumatic.  Eyes: EOM are normal. Pupils are equal, round, and reactive to light.  Neck: Neck supple. No thyromegaly present.  Cardiovascular: Normal rate, regular rhythm, normal heart sounds and intact distal pulses.  Exam reveals no gallop and no friction rub.   No murmur heard. Pulmonary/Chest: Effort normal and breath sounds normal. He has no wheezes. He has no rales.  Abdominal: Soft. There is no tenderness.  Musculoskeletal: He exhibits no edema.  Lymphadenopathy:    He has no cervical adenopathy.  Neurological: He is alert and oriented to person, place, and time. No cranial nerve deficit.  No focal motor deficit  Skin: Skin is warm and dry.  Vitals reviewed.    Diagnostic Tests: I reviewed the CT scan from [facility] done on 11/07/2014 There is a 5.1 x 4.2 x 3.3 cm soft tissue mass in the anterior mediastinum. This is well circumscribed with clear fat planes. There is some coronary artery calcification. There are small right lung nodules, likely granulomas.  Impression: 74 year old man with an incidentally noted 5.1 x 4.2 x 3.3 cm anterior mediastinal mass. This is most consistent with a thymoma. He does not have any history to suggest myasthenia gravis. On CT this appears to be a benign lesion with clear fat planes and no evidence of invasion.  We discussed potential diagnostic and treatment algorithms. One option would be to do a PET/CT. Given the size of the anterior mediastinal mass I think it should be resected. Therefore PET would not change treatment. We discussed potential operative approaches for resection. Those include video assisted thoracoscopic approach versus a sternotomy. We discussed unilateral versus bilateral  thoracoscopy.  After discussion of the indications, risks, benefits, and alternatives, Dr. Carlis Abbott wishes to proceed with a resection of the anterior mediastinal mass via a thoracoscopic approach. We will do this from the left side. He and Mrs. Abshier understand the general nature of the procedure, the incisions be used, the expected hospital stay, and overall recovery. He understands the risks include, but are not limited to death, MI, DVT, PE, bleeding, possible need for transfusion, infection, recurrence, possible need for sternotomy if there is invasion of surrounding structures, as well as the possibility of unforeseeable complications.   We discussed the potential use of cryo-analgesia of intercostal nerves  to help with postoperative pain control. We discussed the risks and benefits of that. He does wish to have cryo-analgesia time of his resection.  Plan: Left VATS, resection of anterior mediastinal mass, cryo-analgesia of intercostal nerves on Wednesday, March 16. He'll be admitted on the day of surgery   Melrose Nakayama, MD [facility] [phone]

## 2014-11-26 NOTE — Interval H&P Note (Signed)
History and Physical Interval Note:  11/26/2014 8:14 AM  [name] [name]  has presented today for surgery, with the diagnosis of MEDIASTINAL MASS  The various methods of treatment have been discussed with the patient and family. After consideration of risks, benefits and other options for treatment, the patient has consented to  Procedure(s): VIDEO ASSISTED THORACOSCOPY (Left) RESECTION OF ANTERIOR MEDIASTINAL MASS (N/A) CRYO INTERCOSTAL NERVE BLOCK (N/A) as a surgical intervention .  The patient's history has been reviewed, patient examined, no change in status, stable for surgery.  I have reviewed the patient's chart and labs.  Questions were answered to the patient's satisfaction.     Melrose Nakayama

## 2014-11-26 NOTE — Anesthesia Postprocedure Evaluation (Signed)
   Anesthesia Post-op Note  Patient: [name] [name]  Procedure(s) Performed: Procedure(s): VIDEO ASSISTED THORACOSCOPY (Left) RESECTION OF ANTERIOR MEDIASTINAL MASS (N/A) CRYO INTERCOSTAL NERVE BLOCK (N/A) CYSTOSCOPY FLEXIBLE (N/A) CYSTOSCOPY WITH URETHRAL DILATATION WITH INSERTION OF FOLEY (N/A)  Patient Location: PACU  Anesthesia Type:General  Level of Consciousness: awake, alert  and oriented  Airway and Oxygen Therapy: Patient connected to nasal cannula oxygen  Post-op Pain: 4 /10  Post-op Assessment: Post-op Vital signs reviewed, Patient's Cardiovascular Status Stable, Respiratory Function Stable, Patent Airway and No signs of Nausea or vomiting  Post-op Vital Signs: Reviewed and stable  Last Vitals:  Filed Vitals:   11/26/14 1521  BP:   Pulse: 67  Temp:   Resp: 23    Complications: No apparent anesthesia complications

## 2014-11-26 NOTE — Brief Op Note (Addendum)
      [address]       [place] [address]             [phone]     11/26/2014  1:56 PM  PATIENT:  [name] [name]  74 y.o. male  PRE-OPERATIVE DIAGNOSIS:  ANTERIOR MEDIASTINAL MASS  POST-OPERATIVE DIAGNOSIS:  ANTERIOR MEDIASTINAL CYST  PROCEDURE:  Procedure(s): VIDEO ASSISTED THORACOSCOPY RESECTION OF ANTERIOR MEDIASTINAL MASS CRYO INTERCOSTAL NERVE BLOCK  CYSTOSCOPY FLEXIBLE CYSTOSCOPY WITH URETHRAL DILATATION WITH INSERTION OF FOLEY  SURGEON:  Surgeon(s): Melrose Nakayama, MD Bjorn Loser, MD  PHYSICIAN ASSISTANT: WAYNE GOLD PA-C  ANESTHESIA:   general  SPECIMEN:  Source of Specimen:  ANT MEDIASTINAL MASS AND LN SAMPLES  DISPOSITION OF SPECIMEN:  Pathology  DRAINS: 1 Chest Tube(s) in the LEFT HEMITHORAX   PATIENT CONDITION:  PACU - hemodynamically stable.  PRE-OPERATIVE WEIGHT: 90kg  EBL: 871LL  COMPLICATIONS: NO KNOWN  FROZEN: BENIGN CYST- likely thymic origin

## 2014-11-26 NOTE — Op Note (Signed)
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 Bjorn Loser  I was called urgently to the cardiac room for inability to pass a catheter in a patient whose having chest surgery. The medical team informed me that the patient followed by Dr. Alinda Money is had a radical prostatectomy.  I initially cystoscoped the patient. Penile bulbar urethra were normal. At the level of the bladder neck and membranous urethra he had approximate 4-6 French bladder neck contracture that looked short with not a lot of spongiose fibrosis  Under direct vision I passed cystoscopically a sensor wire curling nicely in the bladder. I prepared our balloon dilation catheter easily passed into the bladder and cystoscopically checked this position. I doubt dilated for 45 minutes under 18 atm of pressure 24 [place].  The balloon was deflated. It was removed with little to no blood on the balloon. I re-cystoscoped the patient and his bladder neck was well opened. There is minimal oozing at the bladder neck. I quickly looked in the bladder and a looked normal with no cystitis or foreign body. Trigone looked normal  With mild resistance I easily placed a 16 French Councill catheter blowing up the balloon. Urine was clear. I will follow the patient tonight  Cc Dr. Dutch Gray [facility]

## 2014-11-26 NOTE — Progress Notes (Signed)
Patient ID: [name] [name], male   DOB: [birth year], 75 y.o.   MRN: [id number] EVENING ROUNDS NOTE :     [address]       [place] [address]             [phone]                 Day of Surgery Procedure(s) (LRB): VIDEO ASSISTED THORACOSCOPY (Left) RESECTION OF ANTERIOR MEDIASTINAL MASS (N/A) CRYO INTERCOSTAL NERVE BLOCK (N/A) CYSTOSCOPY FLEXIBLE (N/A) CYSTOSCOPY WITH URETHRAL DILATATION WITH INSERTION OF FOLEY (N/A)  Total Length of Stay:  LOS: 0 days  BP 114/73 mmHg  Pulse 65  Temp(Src) 97.4 F (36.3 C) (Oral)  Resp 13  Wt 199 lb 3.2 oz (90.357 kg)  SpO2 94%  .Intake/Output      03/15 0701 - 03/16 0700 03/16 0701 - 03/17 0700   I.V. (mL/kg)  1500 (16.6)   Total Intake(mL/kg)  1500 (16.6)   Urine (mL/kg/hr)  650 (0.7)   Blood  250 (0.3)   Chest Tube  180 (0.2)   Total Output   1080   Net   +420          . dextrose 5 % and 0.9% NaCl 125 mL/hr at 11/26/14 1700     Lab Results  Component Value Date   WBC 5.0 11/24/2014   HGB 14.7 11/24/2014   HCT 43.2 11/24/2014   PLT 188 11/24/2014   GLUCOSE 83 11/24/2014   CHOL 197 08/15/2013   TRIG 98 08/15/2013   HDL 40 08/15/2013   LDLCALC 137* 08/15/2013   ALT 19 11/24/2014   AST 24 11/24/2014   NA 137 11/24/2014   K 4.3 11/24/2014   CL 107 11/24/2014   CREATININE 0.76 11/24/2014   BUN 20 11/24/2014   CO2 25 11/24/2014   TSH 1.459 08/15/2013   INR 1.19 11/24/2014   Extubated, stable early post op not bleeding  Grace Isaac MD  Beeper [phone] Office [phone] 11/26/2014 5:33 PM

## 2014-11-26 NOTE — OR Nursing (Signed)
Ardyth Man, RN, Carlyn Reichert, RN, and Dr. Modesto Charon attempted to insert a catheter into this patient. We attempted a 16 foley, 16 coude, and 14 coude and none of these were successful. Urology was called and Dr. Matilde Sprang performed a cystoscopy with urethral dilation and catheter insertion. He was able to insert a 16 Councill catheter. Urine return was slightly bloody.

## 2014-11-26 NOTE — Anesthesia Preprocedure Evaluation (Addendum)
Anesthesia Evaluation  Patient identified by MRN, date of birth, ID band Patient awake    Reviewed: Allergy & Precautions, NPO status , Patient's Chart, lab work & pertinent test results  Airway Mallampati: II  TM Distance: >3 FB Neck ROM: Full    Dental   Pulmonary  breath sounds clear to auscultation        Cardiovascular + dysrhythmias Atrial Fibrillation Rhythm:Regular Rate:Normal     Neuro/Psych  Neuromuscular disease    GI/Hepatic negative GI ROS, Neg liver ROS,   Endo/Other  negative endocrine ROS  Renal/GU negative Renal ROS     Musculoskeletal negative musculoskeletal ROS (+)   Abdominal   Peds negative pediatric ROS (+)  Hematology negative hematology ROS (+)   Anesthesia Other Findings Patient says that he can lay flat with out any problems.  Reproductive/Obstetrics negative OB ROS                            Anesthesia Physical Anesthesia Plan  ASA: III  Anesthesia Plan: General   Post-op Pain Management:    Induction: Intravenous  Airway Management Planned: Double Lumen EBT  Additional Equipment: Arterial line and CVP  Intra-op Plan:   Post-operative Plan: Extubation in OR  Informed Consent: I have reviewed the patients History and Physical, chart, labs and discussed the procedure including the risks, benefits and alternatives for the proposed anesthesia with the patient or authorized representative who has indicated his/her understanding and acceptance.   Dental advisory given  Plan Discussed with: Anesthesiologist, CRNA and Surgeon  Anesthesia Plan Comments:        Anesthesia Quick Evaluation

## 2014-11-26 NOTE — Anesthesia Procedure Notes (Addendum)
Procedure Name: Intubation Date/Time: 11/26/2014 8:57 AM Performed by: Shirlyn Goltz Pre-anesthesia Checklist: Patient identified, Emergency Drugs available, Suction available and Patient being monitored Patient Re-evaluated:Patient Re-evaluated prior to inductionOxygen Delivery Method: Circle system utilized Preoxygenation: Pre-oxygenation with 100% oxygen Intubation Type: IV induction Ventilation: Mask ventilation without difficulty and Oral airway inserted - appropriate to patient size Laryngoscope Size: Mac and 4 Grade View: Grade III Endobronchial tube: Left and Double lumen EBT and 39 Fr Number of attempts: 1 Airway Equipment and Method: Stylet Placement Confirmation: ETT inserted through vocal cords under direct vision,  positive ETCO2 and breath sounds checked- equal and bilateral Tube secured with: Tape Dental Injury: Teeth and Oropharynx as per pre-operative assessment

## 2014-11-26 NOTE — Transfer of Care (Signed)
Immediate Anesthesia Transfer of Care Note  Patient: [name] [name]  Procedure(s) Performed: Procedure(s): VIDEO ASSISTED THORACOSCOPY (Left) RESECTION OF ANTERIOR MEDIASTINAL MASS (N/A) CRYO INTERCOSTAL NERVE BLOCK (N/A) CYSTOSCOPY FLEXIBLE (N/A) CYSTOSCOPY WITH URETHRAL DILATATION WITH INSERTION OF FOLEY (N/A)  Patient Location: PACU  Anesthesia Type:General  Level of Consciousness: awake, patient cooperative and lethargic  Airway & Oxygen Therapy: Patient Spontanous Breathing and Patient connected to face mask oxygen  Post-op Assessment: Report given to RN, Post -op Vital signs reviewed and stable, Patient moving all extremities and Patient moving all extremities X 4  Post vital signs: Reviewed and stable  Last Vitals:  Filed Vitals:   11/26/14 0711  BP: 123/81  Pulse: 54  Resp: 18    Complications: No apparent anesthesia complications

## 2014-11-27 ENCOUNTER — Inpatient Hospital Stay (HOSPITAL_COMMUNITY): Payer: Medicare Other

## 2014-11-27 ENCOUNTER — Encounter (HOSPITAL_COMMUNITY): Payer: Self-pay | Admitting: Thoracic Surgery (Cardiothoracic Vascular Surgery)

## 2014-11-27 DIAGNOSIS — I480 Paroxysmal atrial fibrillation: Secondary | ICD-10-CM

## 2014-11-27 LAB — CBC
HCT: 37.5 % — ABNORMAL LOW (ref 39.0–52.0)
HEMOGLOBIN: 12.8 g/dL — AB (ref 13.0–17.0)
MCH: 30.8 pg (ref 26.0–34.0)
MCHC: 34.1 g/dL (ref 30.0–36.0)
MCV: 90.1 fL (ref 78.0–100.0)
Platelets: 176 10*3/uL (ref 150–400)
RBC: 4.16 MIL/uL — ABNORMAL LOW (ref 4.22–5.81)
RDW: 13.2 % (ref 11.5–15.5)
WBC: 10.6 10*3/uL — ABNORMAL HIGH (ref 4.0–10.5)

## 2014-11-27 LAB — BASIC METABOLIC PANEL
ANION GAP: 5 (ref 5–15)
BUN: 17 mg/dL (ref 6–23)
CHLORIDE: 105 mmol/L (ref 96–112)
CO2: 26 mmol/L (ref 19–32)
Calcium: 8.3 mg/dL — ABNORMAL LOW (ref 8.4–10.5)
Creatinine, Ser: 0.78 mg/dL (ref 0.50–1.35)
GFR calc non Af Amer: 88 mL/min — ABNORMAL LOW (ref >90)
Glucose, Bld: 171 mg/dL — ABNORMAL HIGH (ref 70–99)
Potassium: 3.5 mmol/L (ref 3.5–5.1)
Sodium: 136 mmol/L (ref 135–145)

## 2014-11-27 LAB — POCT I-STAT 3, ART BLOOD GAS (G3+)
Acid-base deficit: 1 mmol/L (ref 0.0–2.0)
BICARBONATE: 24.5 meq/L — AB (ref 20.0–24.0)
O2 Saturation: 94 %
PCO2 ART: 42.1 mmHg (ref 35.0–45.0)
TCO2: 26 mmol/L (ref 0–100)
pH, Arterial: 7.375 (ref 7.350–7.450)
pO2, Arterial: 73 mmHg — ABNORMAL LOW (ref 80.0–100.0)

## 2014-11-27 MED ORDER — IBUPROFEN 400 MG PO TABS
400.0000 mg | ORAL_TABLET | Freq: Three times a day (TID) | ORAL | Status: DC
  Administered 2014-11-27 – 2014-11-30 (×8): 400 mg via ORAL
  Filled 2014-11-27 (×2): qty 1
  Filled 2014-11-27 (×2): qty 2
  Filled 2014-11-27 (×3): qty 1
  Filled 2014-11-27: qty 2
  Filled 2014-11-27 (×5): qty 1

## 2014-11-27 MED ORDER — POTASSIUM CHLORIDE 10 MEQ/50ML IV SOLN
10.0000 meq | INTRAVENOUS | Status: AC
  Administered 2014-11-27 (×3): 10 meq via INTRAVENOUS
  Filled 2014-11-27 (×3): qty 50

## 2014-11-27 MED ORDER — PANTOPRAZOLE SODIUM 40 MG PO TBEC
40.0000 mg | DELAYED_RELEASE_TABLET | Freq: Two times a day (BID) | ORAL | Status: DC
  Administered 2014-11-27 – 2014-11-30 (×6): 40 mg via ORAL
  Filled 2014-11-27 (×5): qty 1

## 2014-11-27 MED ORDER — AMIODARONE HCL IN DEXTROSE 360-4.14 MG/200ML-% IV SOLN
60.0000 mg/h | INTRAVENOUS | Status: DC
  Administered 2014-11-27: 60 mg/h via INTRAVENOUS
  Filled 2014-11-27 (×2): qty 200

## 2014-11-27 MED ORDER — AMIODARONE HCL IN DEXTROSE 360-4.14 MG/200ML-% IV SOLN
30.0000 mg/h | INTRAVENOUS | Status: DC
  Administered 2014-11-28 – 2014-11-29 (×4): 30 mg/h via INTRAVENOUS
  Filled 2014-11-27 (×7): qty 200

## 2014-11-27 MED ORDER — SODIUM CHLORIDE 0.9 % IV SOLN
INTRAVENOUS | Status: DC
  Administered 2014-11-27 – 2014-11-29 (×2): via INTRAVENOUS

## 2014-11-27 MED ORDER — AMIODARONE LOAD VIA INFUSION
150.0000 mg | Freq: Once | INTRAVENOUS | Status: AC
  Administered 2014-11-27: 150 mg via INTRAVENOUS
  Filled 2014-11-27: qty 83.34

## 2014-11-27 MED ORDER — ENOXAPARIN SODIUM 40 MG/0.4ML ~~LOC~~ SOLN
40.0000 mg | SUBCUTANEOUS | Status: DC
  Administered 2014-11-27 – 2014-11-29 (×3): 40 mg via SUBCUTANEOUS
  Filled 2014-11-27 (×4): qty 0.4

## 2014-11-27 NOTE — Progress Notes (Signed)
Patient transferred to 2W33 via wheelchair. Heart rate up to 140s during transfer from wheelchair to bed. HR back to 110s-120s with rest. RN at bedside.

## 2014-11-27 NOTE — Progress Notes (Signed)
1 Day Post-Op Procedure(s) (LRB): VIDEO ASSISTED THORACOSCOPY (Left) RESECTION OF ANTERIOR MEDIASTINAL MASS (N/A) CRYO INTERCOSTAL NERVE BLOCK (N/A) CYSTOSCOPY FLEXIBLE (N/A) CYSTOSCOPY WITH URETHRAL DILATATION WITH INSERTION OF FOLEY (N/A) Subjective: Some discomfort- 3/10 No SOB  Objective: Vital signs in last 24 hours: Temp:  [97 F (36.1 C)-99 F (37.2 C)] 97.9 F (36.6 C) (03/17 0717) Pulse Rate:  [46-76] 63 (03/17 0700) Cardiac Rhythm:  [-] Normal sinus rhythm (03/17 0600) Resp:  [12-23] 23 (03/17 0700) BP: (90-139)/(53-90) 101/63 mmHg (03/17 0700) SpO2:  [92 %-100 %] 95 % (03/17 0700) Arterial Line BP: (66-152)/(56-97) 72/67 mmHg (03/17 0700)  Hemodynamic parameters for last 24 hours:    Intake/Output from previous day: 03/16 0701 - 03/17 0700 In: 3844 [P.O.:630; I.V.:3164; IV Piggyback:50] Out: 2155 [Urine:1425; Blood:250; Chest Tube:480] Intake/Output this shift:    General appearance: alert, cooperative and no distress Neurologic: intact Heart: slightly irregular Lungs: diminished breath sounds bibasilar no air leak, serosanguinous drainage from CT  Lab Results:  Recent Labs  11/24/14 1109 11/27/14 0400  WBC 5.0 10.6*  HGB 14.7 12.8*  HCT 43.2 37.5*  PLT 188 176   BMET:  Recent Labs  11/24/14 1109 11/27/14 0400  NA 137 136  K 4.3 3.5  CL 107 105  CO2 25 26  GLUCOSE 83 171*  BUN 20 17  CREATININE 0.76 0.78  CALCIUM 9.6 8.3*    PT/INR:  Recent Labs  11/24/14 1109  LABPROT 15.2  INR 1.19   ABG    Component Value Date/Time   PHART 7.375 11/27/2014 0403   HCO3 24.5* 11/27/2014 0403   TCO2 26 11/27/2014 0403   ACIDBASEDEF 1.0 11/27/2014 0403   O2SAT 94.0 11/27/2014 0403   CBG (last 3)  No results for input(s): GLUCAP in the last 72 hours.  Assessment/Plan: S/P Procedure(s) (LRB): VIDEO ASSISTED THORACOSCOPY (Left) RESECTION OF ANTERIOR MEDIASTINAL MASS (N/A) CRYO INTERCOSTAL NERVE BLOCK (N/A) CYSTOSCOPY FLEXIBLE  (N/A) CYSTOSCOPY WITH URETHRAL DILATATION WITH INSERTION OF FOLEY (N/A) Plan for transfer to step-down: see transfer orders   POD # 1 VATS thymectomy- doing well  CV- having PACs  Has hx of atrial fib- ablation several years ago  RESP- pulmonary hygiene  RENAL- creatinine OK, supplement K  ENDO- CBG elevated, no history of DM  Change from D5NS to NS  Ambulate  SCD + enoxaparin for DVT prophylaxis   LOS: 1 day    Melrose Nakayama 11/27/2014

## 2014-11-27 NOTE — Progress Notes (Signed)
  Amiodarone Drug - Drug Interaction Consult Note  Recommendations: No drug interactions currently. Will continue to follow.  Amiodarone is metabolized by the cytochrome P450 system and therefore has the potential to cause many drug interactions. Amiodarone has an average plasma half-life of 50 days (range 20 to 100 days).   There is potential for drug interactions to occur several weeks or months after stopping treatment and the onset of drug interactions may be slow after initiating amiodarone.   []  Statins: Increased risk of myopathy. Simvastatin- restrict dose to 20mg  daily. Other statins: counsel patients to report any muscle pain or weakness immediately.  []  Anticoagulants: Amiodarone can increase anticoagulant effect. Consider warfarin dose reduction. Patients should be monitored closely and the dose of anticoagulant altered accordingly, remembering that amiodarone levels take several weeks to stabilize.  []  Antiepileptics: Amiodarone can increase plasma concentration of phenytoin, the dose should be reduced. Note that small changes in phenytoin dose can result in large changes in levels. Monitor patient and counsel on signs of toxicity.  []  Beta blockers: increased risk of bradycardia, AV block and myocardial depression. Sotalol - avoid concomitant use.  []   Calcium channel blockers (diltiazem and verapamil): increased risk of bradycardia, AV block and myocardial depression.  []   Cyclosporine: Amiodarone increases levels of cyclosporine. Reduced dose of cyclosporine is recommended.  []  Digoxin dose should be halved when amiodarone is started.  []  Diuretics: increased risk of cardiotoxicity if hypokalemia occurs.  []  Oral hypoglycemic agents (glyburide, glipizide, glimepiride): increased risk of hypoglycemia. Patient's glucose levels should be monitored closely when initiating amiodarone therapy.   []  Drugs that prolong the QT interval:  Torsades de pointes risk may be increased  with concurrent use - avoid if possible.  Monitor QTc, also keep magnesium/potassium WNL if concurrent therapy can't be avoided. [name] Kitchen Antibiotics: e.g. fluoroquinolones, erythromycin. . Antiarrhythmics: e.g. quinidine, procainamide, disopyramide, sotalol. . Antipsychotics: e.g. phenothiazines, haloperidol.  . Lithium, tricyclic antidepressants, and methadone.  Thank You,  Renold Genta, Pharm.D., BCPS Clinical Pharmacist Pager: [phone] 11/27/2014 5:57 PM

## 2014-11-27 NOTE — Consult Note (Addendum)
CARDIOLOGY CONSULT NOTE   Patient ID: [name] [name] MRN: [id number], DOB/AGE: [birth year]   Admit date: 11/26/2014 Date of Consult: 11/27/2014   Primary Physician: Kennon Portela, MD Primary Cardiologist: none ( remotely, Dr. Ola Spurr at [facility])  Pt. Profile  74 year old gentleman who is one day postop video-assisted thoracoscopy with resection of an anterior mediastinal mass.  Today he went into atrial fibrillation.  Problem List  Past Medical History  Diagnosis Date  . Atrial fibrillation   . Colon polyps     hyperplastic and tubular adenomatous  . Thrombocytopenia   . Radiation     for prostate cancer  . Neuromuscular disorder   . Prostate cancer   . Erectile dysfunction following radical prostatectomy   . Mediastinal mass     per CT CHEST 11/07/14 @ [facility].[name] KitchenANTERIOR...4.2 X 3.3 cm soft tissue  . Dysrhythmia     hx at fib- ablation 8 yrs ago    Past Surgical History  Procedure Laterality Date  . Knee arthroscopy      left  . Tonsillectomy    . Back surgery    . Robot assisted laparoscopic radical prostatectomy  2009    radiation Tx 2011  . Vasectomy    . Colonoscopy    . Polypectomy    . Atrial ablation surgery      for a fib x 5 years ago  . Flexible sigmoidoscopy  02/10/2012    Procedure: FLEXIBLE SIGMOIDOSCOPY;  Surgeon: Irene Shipper, MD;  Location: [facility];  Service: Endoscopy;  Laterality: N/A;  need APC   . Prostate surgery    . Video assisted thoracoscopy Left 11/26/2014    Procedure: VIDEO ASSISTED THORACOSCOPY;  Surgeon: Melrose Nakayama, MD;  Location: [place];  Service: Thoracic;  Laterality: Left;  . Resection of mediastinal mass N/A 11/26/2014    Procedure: RESECTION OF ANTERIOR MEDIASTINAL MASS;  Surgeon: Melrose Nakayama, MD;  Location: [place];  Service: Thoracic;  Laterality: N/A;  . Lead removal N/A 11/26/2014    Procedure: CRYO INTERCOSTAL NERVE BLOCK;  Surgeon: Melrose Nakayama, MD;  Location:  [place];  Service: Thoracic;  Laterality: N/A;  . Cystoscopy N/A 11/26/2014    Procedure: Erlene Quan;  Surgeon: Bjorn Loser, MD;  Location: [place];  Service: Urology;  Laterality: N/A;  . Cystoscopy with urethral dilatation N/A 11/26/2014    Procedure: CYSTOSCOPY WITH URETHRAL DILATATION WITH INSERTION OF FOLEY;  Surgeon: Bjorn Loser, MD;  Location: [place];  Service: Urology;  Laterality: N/A;     Allergies  No Known Allergies  HPI   This 74 year old gentleman has been in good general health.  He has a past history of prostate cancer and underwent prostate surgery by Dr. Alinda Money 5 years ago.  During a routine workup by urology for possible inclusion in a treatment protocol for prostate cancer, the patient was found to have a mediastinal mass.  This was removed yesterday by Dr. Roxan Hockey.  The patient has had an uneventful hospital course until this afternoon when he went into atrial fibrillation.  He is not having any chest pain or shortness of breath. The patient has a past history of atrial fibrillation.  He had had 3 prior cardioversions and had been on long-term Coumadin.8 years ago he saw Dr. Ola Spurr at [facility] and underwent an ablation of his atrial fibrillation.  He has had an excellent response and he has had no recurrent atrial fibrillation until today.  He has been on no  medications at home except for Advil.  He denies any exertional chest pain or shortness of breath.  He gets regular exercise.  2 days ago he carried his bag 18 holes playing golf.  He does not have any history of high blood pressure or ischemic heart disease or hyperthyroidism. His family history is negative for atrial fibrillation.  Positive for heart attack in his mother.  Inpatient Medications  . acetaminophen  1,000 mg Oral 4 times per day   Or  . acetaminophen (TYLENOL) oral liquid 160 mg/5 mL  1,000 mg Oral 4 times per day  . bisacodyl  10 mg Oral Daily  . Chlorhexidine Gluconate Cloth   6 each Topical Daily  . enoxaparin (LOVENOX) injection  40 mg Subcutaneous Q24H  . fentaNYL   Intravenous 6 times per day  . mupirocin ointment  1 application Nasal BID  . senna-docusate  1 tablet Oral QHS    Family History Family History  Problem Relation Age of Onset  . Prostate cancer Father   . Colon cancer Neg Hx   . Heart disease Mother      Social History History   Social History  . Marital Status: Married    Spouse Name: N/A  . Number of Children: 4  . Years of Education: N/A   Occupational History  . orhodontist    Social History Main Topics  . Smoking status: Never Smoker   . Smokeless tobacco: Never Used  . Alcohol Use: No  . Drug Use: No  . Sexual Activity: Not on file   Other Topics Concern  . Not on file   Social History Narrative     Review of Systems  General:  No chills, fever, night sweats or weight changes.  Cardiovascular:  No chest pain, dyspnea on exertion, edema, orthopnea, palpitations, paroxysmal nocturnal dyspnea. Dermatological: No rash, lesions/masses Respiratory: No cough, dyspnea Urologic: No hematuria, dysuria Abdominal:   No nausea, vomiting, diarrhea, bright red blood per rectum, melena, or hematemesis Neurologic:  No visual changes, wkns, changes in mental status. All other systems reviewed and are otherwise negative except as noted above.  Physical Exam  Blood pressure 106/74, pulse 135, temperature 98.7 F (37.1 C), temperature source Oral, resp. rate 23, weight 199 lb 3.2 oz (90.357 kg), SpO2 92 %.  General: Pleasant, NAD Psych: Normal affect. Neuro: Alert and oriented X 3. Moves all extremities spontaneously. HEENT: Normal  Neck: Supple without bruits or JVD. Lungs:  Resp regular and unlabored, CTA. Heart: irregularly irregular rhythm no s3, s4, or murmurs. Abdomen: Soft, non-tender, non-distended, BS + x 4.  Extremities: No clubbing, cyanosis or edema. DP/PT/Radials 2+ and equal bilaterally.  Labs  No results  for input(s): CKTOTAL, CKMB, TROPONINI in the last 72 hours. Lab Results  Component Value Date   WBC 10.6* 11/27/2014   HGB 12.8* 11/27/2014   HCT 37.5* 11/27/2014   MCV 90.1 11/27/2014   PLT 176 11/27/2014     Recent Labs Lab 11/24/14 1109 11/27/14 0400  NA 137 136  K 4.3 3.5  CL 107 105  CO2 25 26  BUN 20 17  CREATININE 0.76 0.78  CALCIUM 9.6 8.3*  PROT 7.1  --   BILITOT 0.8  --   ALKPHOS 79  --   ALT 19  --   AST 24  --   GLUCOSE 83 171*   Lab Results  Component Value Date   CHOL 197 08/15/2013   HDL 40 08/15/2013   LDLCALC 137* 08/15/2013  TRIG 98 08/15/2013   No results found for: DDIMER  Radiology/Studies  Dg Chest 2 View  11/24/2014   CLINICAL DATA:  74 year old male preoperative chest x-ray for video-assisted fluoroscopy of mediastinal mass. Initial encounter.  EXAM: CHEST  2 VIEW  COMPARISON:  Chest CT 11/07/2014 and earlier.  FINDINGS: Subtle increased density in the anterior superior mediastinum radiographically corresponds to the soft tissue lesion seen by CT in February. Other mediastinal contours are stable and within normal limits. No pneumothorax, pulmonary edema, pleural effusion or confluent pulmonary opacity. Flowing osteophytes in the spine. No acute or suspicious osseous lesion identified.  IMPRESSION: Anterior superior mediastinal mass is subtle by plain radiography. No new cardiopulmonary abnormality.   Electronically Signed   By: Genevie Ann M.D.   On: 11/24/2014 11:06   Nm Bone Scan Whole Body  11/07/2014   CLINICAL DATA:  Prostate cancer.SPARTAN [phone]  EXAM: NUCLEAR MEDICINE WHOLE BODY BONE SCAN  TECHNIQUE: Whole body anterior and posterior images were obtained approximately 3 hours after intravenous injection of radiopharmaceutical.  RADIOPHARMACEUTICALS:  25.6 mCi Technetium-99 MDP  COMPARISON:  CT scan 11/07/2014  PCWG2 Measurements for Bone Metastasis:  (baseline scan)  No evidence of skeletal metastasis.  FINDINGS: There is mild uptake within  the lumbar spine which corresponds to osteophytosis on comparison CT. No abnormal uptake in the pelvis, ribs, or femurs suggest skeletal metastasis. Degenerate uptake noted in the acromioclavicular joints.  IMPRESSION: 1. No scintigraphic evidence of skeletal metastasis. 2. Uptake in the lumbar spine corresponds to bulky osteophytosis on comparison CT scan.   Electronically Signed   By: Suzy Bouchard M.D.   On: 11/07/2014 14:41   Dg Chest Port 1 View  11/27/2014   CLINICAL DATA:  Chest tubes after a video-assisted thoracoscopy  EXAM: PORTABLE CHEST - 1 VIEW  COMPARISON:  Chest x-ray from yesterday  FINDINGS: There is a surgical tube oriented obliquely across the chest. Right IJ central line, tip at the SVC.  There is persistent streaky left basilar opacity with volume loss, consistent with atelectasis. The right lung is relatively clear. No pneumothorax.  No gross change in heart size or mediastinal contours in this patient post recent thoracotomy. The mediastinal structures are distorted by rightward rotation.  IMPRESSION: 1. Stable postoperative atelectasis on the left. 2. No pneumothorax.   Electronically Signed   By: Monte Fantasia M.D.   On: 11/27/2014 07:10   Dg Chest Port 1 View  11/26/2014   CLINICAL DATA:  74 year old male, postop resection of mediastinal mass.  EXAM: PORTABLE CHEST - 1 VIEW  COMPARISON:  11/24/2014, CT chest 10/28/2014  FINDINGS: Cardiomediastinal silhouette likely unchanged, though partially obscured by overlying lung/ pleural disease.  No evidence of pulmonary vascular congestion.  Opacities in the retrocardiac region at the left base. No pneumothorax.  Surgical clips overlie the mediastinum/carina.  Surgical tubing overlies the mediastinum, left lower lung, left upper abdomen, and terminates to the right of the right mediastinal border.  Right IJ approach central line appears to terminate in superior vena cava.  No confluent airspace disease of the right lung.  IMPRESSION:  Interval postsurgical changes of mediastinal mass resection. Surgical clips overlie the mediastinum.  Airspace opacities at the left base may reflect atelectasis and/or consolidation. No left pneumothorax or pleural effusion.  Surgical tubing overlies the mediastinum and the left upper quadrant/left lower lung. This tube/drain terminates to the right of the right mediastinal border, and may represent a mediastinal drain. Recommend correlation with operative report.  Right IJ approach  central line appears to terminate in superior vena cava.  Signed,  Dulcy Fanny. Earleen Newport, DO  [facility]  [facility]   Electronically Signed   By: Corrie Mckusick D.O.   On: 11/26/2014 15:19    ECG  Twelve-lead EKG pending.  Telemetry shows atrial fibrillation with a ventricular response of 126.  ASSESSMENT AND PLAN  1. Postoperative atrial fibrillation with rapid ventricular response. His Chads-Vasc score is 1 for age >11. Continue medical dose lovenox 40 mg q24 hour. 2. Past history of atrial fibrillation ablation 8 years ago at [facility]. 3.One day postop VATS for resection of anterior mediastinal mass 4. History of prostate cancer  Plan: Continue IV amiodarone which has already been started by Dr. Roxan Hockey.  We will consider addition of low-dose beta blocker although right now blood pressure is too soft at 106/74. We will obtain a two-dimensional echocardiogram tomorrow. Will follow with you.  Signed, Darlin Coco, MD  11/27/2014, 6:38 PM Addendum: 12-lead EKG shows new widespread ST segment elevation which is very suggestive of pericarditis.This is the probable trigger for the atrial fibrillation.  We will get follow-up EKG in a.m.  No pericardial rub audible. Will treat empirically with ibuprofen and cover with protonix.

## 2014-11-27 NOTE — Progress Notes (Signed)
Pt converted to afib with RVR HR between 130-150s, pt is asymptomatic, nurse notified MD Roxan Hockey, amiodarone protocol initiated with bolus. Pt can transfer once HR is controlled per MD. Nurse will continue to monitor.

## 2014-11-28 ENCOUNTER — Inpatient Hospital Stay (HOSPITAL_COMMUNITY): Payer: Medicare Other

## 2014-11-28 DIAGNOSIS — I4891 Unspecified atrial fibrillation: Secondary | ICD-10-CM

## 2014-11-28 LAB — COMPREHENSIVE METABOLIC PANEL
ALT: 15 U/L (ref 0–53)
AST: 20 U/L (ref 0–37)
Albumin: 3 g/dL — ABNORMAL LOW (ref 3.5–5.2)
Alkaline Phosphatase: 57 U/L (ref 39–117)
Anion gap: 6 (ref 5–15)
BUN: 15 mg/dL (ref 6–23)
CHLORIDE: 100 mmol/L (ref 96–112)
CO2: 29 mmol/L (ref 19–32)
CREATININE: 0.84 mg/dL (ref 0.50–1.35)
Calcium: 8.5 mg/dL (ref 8.4–10.5)
GFR, EST NON AFRICAN AMERICAN: 85 mL/min — AB (ref >90)
Glucose, Bld: 121 mg/dL — ABNORMAL HIGH (ref 70–99)
Potassium: 3.8 mmol/L (ref 3.5–5.1)
Sodium: 135 mmol/L (ref 135–145)
Total Bilirubin: 1.5 mg/dL — ABNORMAL HIGH (ref 0.3–1.2)
Total Protein: 5.8 g/dL — ABNORMAL LOW (ref 6.0–8.3)

## 2014-11-28 LAB — CBC
HEMATOCRIT: 38 % — AB (ref 39.0–52.0)
Hemoglobin: 12.5 g/dL — ABNORMAL LOW (ref 13.0–17.0)
MCH: 30 pg (ref 26.0–34.0)
MCHC: 32.9 g/dL (ref 30.0–36.0)
MCV: 91.3 fL (ref 78.0–100.0)
PLATELETS: 163 10*3/uL (ref 150–400)
RBC: 4.16 MIL/uL — ABNORMAL LOW (ref 4.22–5.81)
RDW: 13.2 % (ref 11.5–15.5)
WBC: 11.3 10*3/uL — AB (ref 4.0–10.5)

## 2014-11-28 MED ORDER — ZOLPIDEM TARTRATE 5 MG PO TABS
5.0000 mg | ORAL_TABLET | Freq: Every evening | ORAL | Status: DC | PRN
  Administered 2014-11-28 – 2014-11-30 (×2): 5 mg via ORAL
  Filled 2014-11-28 (×3): qty 1

## 2014-11-28 MED ORDER — METOCLOPRAMIDE HCL 5 MG/ML IJ SOLN
10.0000 mg | Freq: Four times a day (QID) | INTRAMUSCULAR | Status: AC
  Administered 2014-11-28 – 2014-11-29 (×3): 10 mg via INTRAVENOUS
  Filled 2014-11-28 (×4): qty 2

## 2014-11-28 MED ORDER — PERFLUTREN LIPID MICROSPHERE
1.0000 mL | INTRAVENOUS | Status: AC | PRN
  Administered 2014-11-28: 2 mL via INTRAVENOUS
  Filled 2014-11-28: qty 10

## 2014-11-28 MED ORDER — DIPHENHYDRAMINE HCL 25 MG PO CAPS: 25.0000 mg | ORAL_CAPSULE | Freq: Every evening | ORAL | Status: DC | PRN

## 2014-11-28 MED ORDER — LEVALBUTEROL HCL 0.63 MG/3ML IN NEBU
INHALATION_SOLUTION | RESPIRATORY_TRACT | Status: AC
  Administered 2014-11-28: 0.63 mg via RESPIRATORY_TRACT
  Filled 2014-11-28: qty 3

## 2014-11-28 MED ORDER — POTASSIUM CHLORIDE CRYS ER 20 MEQ PO TBCR
20.0000 meq | EXTENDED_RELEASE_TABLET | Freq: Once | ORAL | Status: AC
  Administered 2014-11-28: 20 meq via ORAL
  Filled 2014-11-28: qty 1

## 2014-11-28 MED ORDER — METOPROLOL TARTRATE 12.5 MG HALF TABLET
12.5000 mg | ORAL_TABLET | Freq: Two times a day (BID) | ORAL | Status: DC
  Administered 2014-11-28 – 2014-11-29 (×3): 12.5 mg via ORAL
  Filled 2014-11-28 (×6): qty 1

## 2014-11-28 MED ORDER — LEVALBUTEROL HCL 0.63 MG/3ML IN NEBU
0.6300 mg | INHALATION_SOLUTION | Freq: Four times a day (QID) | RESPIRATORY_TRACT | Status: DC
  Administered 2014-11-28 – 2014-11-29 (×4): 0.63 mg via RESPIRATORY_TRACT
  Filled 2014-11-28 (×7): qty 3

## 2014-11-28 NOTE — Discharge Summary (Signed)
Physician Discharge Summary       [address]       [place] [address]             [phone]    Patient ID: [name] [name] MRN: [id number] DOB/AGE: 16-Jul-1941 74 y.o.  Admit date: 11/26/2014 Discharge date: 11/30/2014  Admission Diagnoses: 1. Anterior mediastinal mass 2. History of atrial fibrillation 3. History of prostate cancer (s/p radical prostatectomy and radiation) 4. History of thrombocytopenia 5. History of erectile dysfunction  Discharge Diagnoses:  1. Anterior mediastinal mass 2. History of atrial fibrillation 3. History of prostate cancer (s/p radical prostatectomy and radiation) 4. History of thrombocytopenia 5. History of erectile dysfunction  Consults: cardiology  Procedure (s):  1. Cystoscopy and balloon dilation of bladder neck contracture and insertion of Foley catheter by Dr. Matilde Sprang on 11/26/2014  2. VIDEO ASSISTED THORACOSCOPY RESECTION OF ANTERIOR MEDIASTINAL MASS CRYO INTERCOSTAL NERVE BLOCK by Dr. Roxan Hockey on 11/26/2014.  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 He recently saw Dr. Alinda Money for follow-up of that. His PSA was elevated and he has biochemically recurrent prostate cancer. He was being evaluated for the EMBARK trial. As part of that evaluation he had a CT scan of the chest, abdomen and pelvis. That showed an anterior mediastinal mass. He had a bone scan which was negative.  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 He plays golf on a regular basis. He still works 1 day a week. He feels well. He does say that he sometimes feels a sensation of discomfort behind the sternum. This is not exertional in nature. He has no history of coronary disease. He did have a history of atrial fibrillation and was treated with an ablation. He's not had any recurrent arrhythmias since his ablation. He denies shortness of  breath. He's not had any weight loss. He denies visual disturbance, specifically has no double vision  Dr. Roxan Hockey discussed potential diagnostic and treatment algorithms. One option would be to do a PET/CT. Given the size of the anterior mediastinal mass I think it should be resected. Therefore, PET would not change treatment. We discussed potential operative approaches for resection. Those include video assisted thoracoscopic approach versus a sternotomy. We discussed unilateral versus bilateral thoracoscopy.  After discussion of the indications, risks, benefits, and alternatives, Dr. Carlis Abbott wishes to proceed with a resection of the anterior mediastinal mass via a thoracoscopic approach. We will do this from the left side. He and [name] [name] understand the general nature of the procedure, the incisions be used, the expected hospital stay, and overall recovery. He understands the risks include, but are not limited to death, MI, DVT, PE, bleeding, possible need for transfusion, infection, recurrence, possible need for sternotomy if there is invasion of surrounding structures, as well as the possibility of unforeseeable complications.  Dr. Roxan Hockey discussed the potential use of cryo-analgesia of intercostal nerves to help with postoperative pain control. We discussed the risks and benefits of that. He does wish to have cryo-analgesia time of his resection. He was admitted on 11/26/2014 in order to undergo VATS, resection of anterior mediastinal mass and cryo analgetia on 11/26/2014.  Brief Hospital Course:  A urology consult was obtained as the foley was unable to be re inserted after the patient was intubated. Dr. Matilde Sprang did a cystoscopy, balloon dilation, and placement of foley. He remained afebrile and hemodynamically stable. He had PACs and  then went into a fib with RVR around midnight 3/18. He was put on an Amiodarone drip. He had a history of a fib and had an ablation several years ago. He  converted to sinus rhythm and remained. His chest tube output gradually decreased. Daily chest x rays were obtained and remained stable. There was no air leak. Chest tube was removed on 11/27/2014. He was weaned to room air. He has been tolerating a diet and has had a bowel movement. His wound is clean and dry and there are no signs of infection.Foley was removed 3/19. He was put on Flomax. He has been able to void on his own. He is felt surgically stable for discharge today.   Latest Vital Signs: Blood pressure 123/66, pulse 60, temperature 97.9 F (36.6 C), temperature source Oral, resp. rate 20, height 5\' 10"  (1.778 m), weight 199 lb 3.2 oz (90.357 kg), SpO2 94 %.  Physical Exam: Cardiovascular: RRR Pulmonary: Clear to auscultation bilaterally; no rales, wheezes, or rhonchi. Abdomen: Soft, non tender, tympanic, some distention, some bowel sounds present. Extremities: No ower extremity edema. Wounds: Clean and dry. No erythema or signs of infection.  Discharge Condition:Stable and discharged home  Recent laboratory studies:  Lab Results  Component Value Date   WBC 11.3* 11/28/2014   HGB 12.5* 11/28/2014   HCT 38.0* 11/28/2014   MCV 91.3 11/28/2014   PLT 163 11/28/2014   Lab Results  Component Value Date   NA 135 11/28/2014   K 3.8 11/28/2014   CL 100 11/28/2014   CO2 29 11/28/2014   CREATININE 0.84 11/28/2014   GLUCOSE 121* 11/28/2014      Diagnostic Studies:  EXAM: CHEST 2 VIEW  COMPARISON: 11/28/2014  FINDINGS: No pneumothorax.  Left lung base opacity has improved consistent with improved atelectasis. There is likely a component of a small effusion. Remainder of the lungs is clear.  Right internal jugular central venous line has been removed.  Heart is normal in size. Multiple surgical clips lie superimposed over the aortic knob and arch.  IMPRESSION: 1. No pneumothorax. 2. Improved left lung base atelectasis. Probable residual small effusion on the  left. 3. No pulmonary edema.   Electronically Signed  By: Lajean Manes M.D.  On: 11/30/2014 07:52   Nm Bone Scan Whole Body  11/07/2014   CLINICAL DATA:  Prostate cancer.SPARTAN [phone]  EXAM: NUCLEAR MEDICINE WHOLE BODY BONE SCAN  TECHNIQUE: Whole body anterior and posterior images were obtained approximately 3 hours after intravenous injection of radiopharmaceutical.  RADIOPHARMACEUTICALS:  25.6 mCi Technetium-99 MDP  COMPARISON:  CT scan 11/07/2014  PCWG2 Measurements for Bone Metastasis:  (baseline scan)  No evidence of skeletal metastasis.  FINDINGS: There is mild uptake within the lumbar spine which corresponds to osteophytosis on comparison CT. No abnormal uptake in the pelvis, ribs, or femurs suggest skeletal metastasis. Degenerate uptake noted in the acromioclavicular joints.  IMPRESSION: 1. No scintigraphic evidence of skeletal metastasis. 2. Uptake in the lumbar spine corresponds to bulky osteophytosis on comparison CT scan.   Electronically Signed   By: Suzy Bouchard M.D.   On: 11/07/2014 14:41     Discharge Medications:   Medication List    TAKE these medications        amiodarone 200 MG tablet  Commonly known as:  PACERONE  Take 1 tablet (200 mg total) by mouth 2 (two) times daily. for one week;then take Amiodarone 200 mg by mouth daily thereafter     ibuprofen 400 MG tablet  Commonly known  as:  ADVIL,MOTRIN  Take 1 tablet (400 mg total) by mouth 2 (two) times daily with a meal. For 3 days then stop.     metoprolol tartrate 25 MG tablet  Commonly known as:  LOPRESSOR  Take 0.5 tablets (12.5 mg total) by mouth 2 (two) times daily.     oxyCODONE 5 MG immediate release tablet  Commonly known as:  Oxy IR/ROXICODONE  Take 1-2 tablets (5-10 mg total) by mouth every 4 (four) hours as needed for severe pain.     pantoprazole 40 MG tablet  Commonly known as:  PROTONIX  Take 1 tablet (40 mg total) by mouth daily. For 3 days then may stop     tamsulosin 0.4 MG Caps  capsule  Commonly known as:  FLOMAX  Take 1 capsule (0.4 mg total) by mouth daily.        Follow Up Appointments: Follow-up Information    Follow up with Melrose Nakayama, MD On 12/16/2014.   Specialty:  Cardiothoracic Surgery   Why:  PA/LAT CXR to be taken (at [place] which is in the same building as Dr. Leonarda Salon office) on 12/16/2014 at 11:30 am;Appointment time with Dr. Roxan Hockey is at 12:30 pm   Contact information:   [place] [address] [phone]       Follow up with Darlin Coco, MD.   Specialty:  Cardiology   Why:  Call for a follow up appointment for 2 weeks   Contact information:   [address] [place] [address] [phone]       Follow up with Dutch Gray, MD.   Specialty:  Urology   Why:  Call for a follow up as needed   Contact information:   [place] [address] [phone]       Signed: Malan Werk MPA-C 11/30/2014, 11:06 AM

## 2014-11-28 NOTE — Progress Notes (Signed)
Subjective: Tired.  Needs some sleep  Objective: Vital signs in last 24 hours: Temp:  [98.2 F (36.8 C)-98.8 F (37.1 C)] 98.2 F (36.8 C) (03/18 0455) Pulse Rate:  [50-135] 79 (03/18 0455) Resp:  [15-27] 16 (03/18 0748) BP: (95-108)/(61-74) 104/74 mmHg (03/18 0455) SpO2:  [90 %-98 %] 92 % (03/18 0748) Arterial Line BP: (91-104)/(84-101) 104/101 mmHg (03/17 1000) Last BM Date: 11/25/14  Intake/Output from previous day: 03/17 0701 - 03/18 0700 In: 1253 [P.O.:220; I.V.:833; IV Piggyback:200] Out: 70 [Urine:850; Chest Tube:200] Intake/Output this shift:    Medications Current Facility-Administered Medications  Medication Dose Route Frequency Provider Last Rate Last Dose  . 0.9 %  sodium chloride infusion   Intravenous Continuous Melrose Nakayama, MD 50 mL/hr at 11/27/14 0900    . acetaminophen (TYLENOL) tablet 1,000 mg  1,000 mg Oral 4 times per day [name], PA-C   1,000 mg at 11/27/14 1725   Or  . acetaminophen (TYLENOL) solution 1,000 mg  1,000 mg Oral 4 times per day [name], PA-C   1,000 mg at 11/28/14 0641  . amiodarone (NEXTERONE PREMIX) 360 MG/200ML (1.8 mg/mL) IV infusion  30 mg/hr Intravenous Continuous Melrose Nakayama, MD 16.7 mL/hr at 11/28/14 0747 30 mg/hr at 11/28/14 0747  . bisacodyl (DULCOLAX) EC tablet 10 mg  10 mg Oral Daily Wayne E Gold, PA-C   10 mg at 11/27/14 0900  . Chlorhexidine Gluconate Cloth 2 % PADS 6 each  6 each Topical Daily Melrose Nakayama, MD   6 each at 11/27/14 1000  . enoxaparin (LOVENOX) injection 40 mg  40 mg Subcutaneous Q24H Melrose Nakayama, MD   40 mg at 11/27/14 1000  . ibuprofen (ADVIL,MOTRIN) tablet 400 mg  400 mg Oral TID Darlin Coco, MD   400 mg at 11/27/14 1926  . metoCLOPramide (REGLAN) injection 10 mg  10 mg Intravenous 4 times per day Nani Skillern, PA-C      . mupirocin ointment (BACTROBAN) 2 % 1 application  1 application Nasal BID Melrose Nakayama, MD   1 application at 78/24/23  0900  . oxyCODONE (Oxy IR/ROXICODONE) immediate release tablet 5-10 mg  5-10 mg Oral Q4H PRN [name], PA-C   10 mg at 11/26/14 2145  . pantoprazole (PROTONIX) EC tablet 40 mg  40 mg Oral BID AC Darlin Coco, MD   40 mg at 11/27/14 1926  . potassium chloride 10 mEq in 50 mL *CENTRAL LINE* IVPB  10 mEq Intravenous Daily PRN Wayne E Gold, PA-C      . potassium chloride SA (K-DUR,KLOR-CON) CR tablet 20 mEq  20 mEq Oral Once Donielle M Zimmerman, PA-C      . senna-docusate (Senokot-S) tablet 1 tablet  1 tablet Oral QHS [name], PA-C   1 tablet at 11/26/14 2145  . traMADol (ULTRAM) tablet 50-100 mg  50-100 mg Oral Q6H PRN Wayne E Gold, PA-C      . zolpidem (AMBIEN) tablet 5 mg  5 mg Oral QHS PRN Melrose Nakayama, MD        PE: General appearance: alert, cooperative and no distress Lungs: clear to auscultation bilaterally Heart: irregularly irregular rhythm and No MRG Extremities: No LEE Pulses: 2+ and symmetric Skin: Warm and dry Neurologic: Grossly normal  Lab Results:   Recent Labs  11/27/14 0400 11/28/14 0538  WBC 10.6* 11.3*  HGB 12.8* 12.5*  HCT 37.5* 38.0*  PLT 176 163   BMET  Recent Labs  11/27/14 0400 11/28/14 0538  NA 136 135  K 3.5 3.8  CL 105 100  CO2 26 29  GLUCOSE 171* 121*  BUN 17 15  CREATININE 0.78 0.84  CALCIUM 8.3* 8.5      Assessment/Plan 74 year old gentleman who is one day postop video-assisted thoracoscopy with resection of an anterior mediastinal mass. Today he went into atrial fibrillation  Active Problems:   Mediastinal mass   POD#2: VATS for resection of anterior mediastinal mass   Postop Afib   Hx of A-fib ablation 8 years ago at [facility] in Afib.  Rate very steady at 100.  On IV Amio.  Continue to load. Started at 1800hrs yesterday.  Bp on the low side.  Echo pending.     LOS: 2 days    HAGER, [name] PA-C 11/28/2014 8:59 AM  His IV beeped all night and he got no sleep.  He feels exhausted today.  No  new chest discomfort.  Repeat EKG ordered for this morning not yet done.  We will reorder.  EKG last night showed diffuse ST elevation suggestive of pericarditis.  His blood pressure is up to 115/70 now.  We will try adding low dose beta blocker to encourage conversion back to sinus rhythm.  Continue IV amiodarone.  2-D echo pending.  Physical examination reveals active bowel sounds.  No pericardial rub heard.

## 2014-11-28 NOTE — Progress Notes (Signed)
Amiodarone drip rate decreased to 16.7 mg per protocol

## 2014-11-28 NOTE — Progress Notes (Addendum)
      [address]       [place] [address]             [phone]       2 Days Post-Op Procedure(s) (LRB): VIDEO ASSISTED THORACOSCOPY (Left) RESECTION OF ANTERIOR MEDIASTINAL MASS (N/A) CRYO INTERCOSTAL NERVE BLOCK (N/A) CYSTOSCOPY FLEXIBLE (N/A) CYSTOSCOPY WITH URETHRAL DILATATION WITH INSERTION OF FOLEY (N/A)  Subjective: Patient states he has not slept in 2 days. He denies abdominal pain, nausea, or emesis. He is passing flatus.  Objective: Vital signs in last 24 hours: Temp:  [98.2 F (36.8 C)-98.8 F (37.1 C)] 98.2 F (36.8 C) (03/18 0455) Pulse Rate:  [50-135] 79 (03/18 0455) Cardiac Rhythm:  [-] Atrial fibrillation (03/17 2005) Resp:  [15-27] 16 (03/18 0748) BP: (95-108)/(61-74) 104/74 mmHg (03/18 0455) SpO2:  [90 %-98 %] 92 % (03/18 0748) Arterial Line BP: (91-104)/(84-101) 104/101 mmHg (03/17 1000)    Intake/Output from previous day: 03/17 0701 - 03/18 0700 In: 1253 [P.O.:220; I.V.:833; IV Piggyback:200] Out: 1050 [Urine:850; Chest Tube:200]   Physical Exam:  Cardiovascular: IRRR IRRR Pulmonary: Clear to auscultation bilaterally; no rales, wheezes, or rhonchi. Abdomen: Soft, non tender, tympanic, some distention, some bowel sounds present. Extremities: No ower extremity edema. Wounds: Clean and dry.  No erythema or signs of infection.   Lab Results: CBC: Recent Labs  11/27/14 0400 11/28/14 0538  WBC 10.6* 11.3*  HGB 12.8* 12.5*  HCT 37.5* 38.0*  PLT 176 163   BMET:  Recent Labs  11/27/14 0400 11/28/14 0538  NA 136 135  K 3.5 3.8  CL 105 100  CO2 26 29  GLUCOSE 171* 121*  BUN 17 15  CREATININE 0.78 0.84  CALCIUM 8.3* 8.5    PT/INR: No results for input(s): LABPROT, INR in the last 72 hours. ABG:  INR: Will add last result for INR, ABG once components are confirmed Will add last 4 CBG results once components are confirmed  Assessment/Plan:  1. CV - A fib with HR in the 110's (had evidence of pericarditis on  EKG so on Ibuprofen) . Patient with a history of a fib in the past. Started on Amiodarone drip just after midnight . Will consider BB if BP allows. Cardiology following. 2.  Pulmonary - On 1 liter oxygen via [place]. Wean as tolerates. CXR this am is stable. Encourage incentive spirometer 3. ABL anemia-H and H stable at 12.5 and 38. 4. Supplement potassium 5. GI-appears to have air in colon on CXR this am. No GI symptoms. Will give Reglan and monitor 6. Patient states not having much pain so will stop PCA 7. Benadryl PRN sleep  ZIMMERMAN,DONIELLE MPA-C 11/28/2014,8:06 AM   Patient seen and examined, agree with above Dc PCA Still in atrial fib, Cardiology following  Remo Lipps C. Roxan Hockey, MD [facility] [phone]

## 2014-11-28 NOTE — Progress Notes (Signed)
Fentanyl PCA DC's per order.  257mcg wasted in sink, witnessed by RN Pecolia Ades.  Pt expresses feeling "exhausted" but no other complaints.  Wife at bedside.  Will con't plan of care.

## 2014-11-28 NOTE — Progress Notes (Addendum)
  Echocardiogram 2D Echocardiogram with Definity has been performed.  [name] [name] 11/28/2014, 11:59 AM

## 2014-11-29 DIAGNOSIS — I4891 Unspecified atrial fibrillation: Secondary | ICD-10-CM

## 2014-11-29 MED ORDER — LEVALBUTEROL HCL 0.63 MG/3ML IN NEBU: 0.6300 mg | INHALATION_SOLUTION | Freq: Four times a day (QID) | RESPIRATORY_TRACT | Status: DC | PRN

## 2014-11-29 MED ORDER — TAMSULOSIN HCL 0.4 MG PO CAPS
0.4000 mg | ORAL_CAPSULE | Freq: Every day | ORAL | Status: DC
  Administered 2014-11-29 – 2014-11-30 (×2): 0.4 mg via ORAL
  Filled 2014-11-29 (×2): qty 1

## 2014-11-29 MED ORDER — AMIODARONE HCL 200 MG PO TABS
400.0000 mg | ORAL_TABLET | Freq: Two times a day (BID) | ORAL | Status: DC
  Administered 2014-11-29 – 2014-11-30 (×3): 400 mg via ORAL
  Filled 2014-11-29 (×4): qty 2

## 2014-11-29 NOTE — Progress Notes (Signed)
Pt having SOB, labored breathing, and diaphoretic upon assessment; wheezing upon auscultation. Dr. Cyndia Bent paged and made aware on pt condition. M.D put in new orders for STAT CXR and Xopenex Q6H. RN followed orders.   CXR performed; no pneumothorax noted. Pt breathing improved with breathing treatments. RN will cont to monitor.

## 2014-11-29 NOTE — Progress Notes (Addendum)
      [address]       [place] [address]             [phone]       3 Days Post-Op Procedure(s) (LRB): VIDEO ASSISTED THORACOSCOPY (Left) RESECTION OF ANTERIOR MEDIASTINAL MASS (N/A) CRYO INTERCOSTAL NERVE BLOCK (N/A) CYSTOSCOPY FLEXIBLE (N/A) CYSTOSCOPY WITH URETHRAL DILATATION WITH INSERTION OF FOLEY (N/A)  Subjective: Patient states tolerating diet and had bowel movement. He is able to sleep.  Objective: Vital signs in last 24 hours: Temp:  [97.4 F (36.3 C)-98.4 F (36.9 C)] 97.4 F (36.3 C) (03/19 0453) Pulse Rate:  [63-89] 63 (03/19 0453) Cardiac Rhythm:  [-] Atrial fibrillation (03/19 0500) Resp:  [18-24] 20 (03/19 0453) BP: (115-123)/(69-79) 120/69 mmHg (03/19 0453) SpO2:  [93 %-98 %] 95 % (03/19 0840)    Intake/Output from previous day: 03/18 0701 - 03/19 0700 In: 240 [P.O.:240] Out: 1101 [Urine:1100; Stool:1]   Physical Exam:  Cardiovascular:RRR Pulmonary: Clear to auscultation bilaterally; no rales, wheezes, or rhonchi. Abdomen: Soft, non tender,non distended, bowel sounds present. Extremities: No lower extremity edema. Wounds: Clean and dry.  No erythema or signs of infection.   Lab Results: CBC:  Recent Labs  11/27/14 0400 11/28/14 0538  WBC 10.6* 11.3*  HGB 12.8* 12.5*  HCT 37.5* 38.0*  PLT 176 163   BMET:   Recent Labs  11/27/14 0400 11/28/14 0538  NA 136 135  K 3.5 3.8  CL 105 100  CO2 26 29  GLUCOSE 171* 121*  BUN 17 15  CREATININE 0.78 0.84  CALCIUM 8.3* 8.5    PT/INR: No results for input(s): LABPROT, INR in the last 72 hours. ABG:  INR: Will add last result for INR, ABG once components are confirmed Will add last 4 CBG results once components are confirmed  Assessment/Plan:  1. CV - A fib with RVR previously (had evidence of pericarditis on EKG so on Ibuprofen) . Patient with a history of a fib in the past. On Amiodarone drip and Lopressor 12.5 mg bid. Converted to SR this am. Will stop  Amiodarone drip and start oral. Echo showed LVEF 50-55%, no significant valvular disease.Cardiology following. 2.  Pulmonary - On 3 liter oxygen via [place]. Wean as tolerates. Check CXR in am.Encourage incentive spirometer 3. ABL anemia-H and H stable at 12.5 and 38. 4. Foley still remains. Likely will remove-will discuss with surgeon. 5. Remove central line 6. Possible discharge in am   ZIMMERMAN,DONIELLE MPA-C 11/29/2014,9:40 AM    Chart reviewed, patient examined, agree with above. Foley just removed. No void yet. Back in sinus rhythm on amio. Plan home tomorrow if no changes

## 2014-11-30 ENCOUNTER — Inpatient Hospital Stay (HOSPITAL_COMMUNITY): Payer: Medicare Other

## 2014-11-30 MED ORDER — OXYCODONE HCL 5 MG PO TABS: 5.0000 mg | ORAL_TABLET | ORAL | Status: DC | PRN

## 2014-11-30 MED ORDER — METOPROLOL TARTRATE 25 MG PO TABS: 12.5000 mg | ORAL_TABLET | Freq: Two times a day (BID) | ORAL | Status: DC

## 2014-11-30 MED ORDER — AMIODARONE HCL 200 MG PO TABS
200.0000 mg | ORAL_TABLET | Freq: Two times a day (BID) | ORAL | Status: DC
  Filled 2014-11-30: qty 1

## 2014-11-30 MED ORDER — PANTOPRAZOLE SODIUM 40 MG PO TBEC: 40.0000 mg | DELAYED_RELEASE_TABLET | Freq: Every day | ORAL | Status: DC

## 2014-11-30 MED ORDER — AMIODARONE HCL 200 MG PO TABS: 200.0000 mg | ORAL_TABLET | Freq: Two times a day (BID) | ORAL | Status: DC

## 2014-11-30 MED ORDER — AMIODARONE HCL 400 MG PO TABS: 200.0000 mg | ORAL_TABLET | Freq: Two times a day (BID) | ORAL | Status: DC

## 2014-11-30 MED ORDER — TAMSULOSIN HCL 0.4 MG PO CAPS: 0.4000 mg | ORAL_CAPSULE | Freq: Every day | ORAL | Status: DC

## 2014-11-30 MED ORDER — IBUPROFEN 400 MG PO TABS: 400.0000 mg | ORAL_TABLET | Freq: Two times a day (BID) | ORAL | Status: DC

## 2014-11-30 NOTE — Progress Notes (Addendum)
      [address]       [place] [address]             [phone]       4 Days Post-Op Procedure(s) (LRB): VIDEO ASSISTED THORACOSCOPY (Left) RESECTION OF ANTERIOR MEDIASTINAL MASS (N/A) CRYO INTERCOSTAL NERVE BLOCK (N/A) CYSTOSCOPY FLEXIBLE (N/A) CYSTOSCOPY WITH URETHRAL DILATATION WITH INSERTION OF FOLEY (N/A)  Subjective: Patient without complaints and would like to go home.  Objective: Vital signs in last 24 hours: Temp:  [97.9 F (36.6 C)-98.4 F (36.9 C)] 97.9 F (36.6 C) (03/20 0530) Pulse Rate:  [60-66] 60 (03/20 0530) Cardiac Rhythm:  [-] Atrial fibrillation (03/19 2144) Resp:  [18-20] 20 (03/20 0530) BP: (122-125)/(65-71) 123/66 mmHg (03/20 0530) SpO2:  [93 %-98 %] 94 % (03/20 0530)    Intake/Output from previous day: 03/19 0701 - 03/20 0700 In: 480 [P.O.:480] Out: 2300 [Urine:2300]   Physical Exam:  Cardiovascular:RRR Pulmonary: Clear to auscultation bilaterally; no rales, wheezes, or rhonchi. Abdomen: Soft, non tender,non distended, bowel sounds present. Extremities: No lower extremity edema. Wounds: Clean and dry.  No erythema or signs of infection.   Lab Results: CBC:  Recent Labs  11/28/14 0538  WBC 11.3*  HGB 12.5*  HCT 38.0*  PLT 163   BMET:   Recent Labs  11/28/14 0538  NA 135  K 3.8  CL 100  CO2 29  GLUCOSE 121*  BUN 15  CREATININE 0.84  CALCIUM 8.5    PT/INR: No results for input(s): LABPROT, INR in the last 72 hours. ABG:  INR: Will add last result for INR, ABG once components are confirmed Will add last 4 CBG results once components are confirmed  Assessment/Plan:  1. CV - A fib with RVR previously (had evidence of pericarditis on EKG so on Ibuprofen) . Patient with a history of a fib in the past. SR in the 60's this am. On Amiodarone 400 bid, Lopressor 12.5 mg bid. Will decrease Amiodarone to 200 bid.As discussed with cardiology, titrate Amiodarone and give Ibuprofen bid for one week (will  continue Protonix while on this) 2.  Pulmonary - On room air this am. CXR shows no pneumothorax, small left pleural effusion, improving atelectasis.Encourage incentive spirometer 3. ABL anemia-H and H stable at 12.5 and 38. 4. GU-previous urinary retention. Foley removed yesterday and he has been able to void on own without problems 5. Discharge   Trisha Morandi MPA-C 11/30/2014,9:01 AM

## 2014-11-30 NOTE — Discharge Instructions (Signed)
Thoracoscopy Care After Refer to this sheet in the next few weeks. These discharge instructions provide you with general information on caring for yourself after you leave the hospital. Your caregiver may also give you specific instructions. Your treatment has been planned according to the most current medical practices available, but unavoidable complications sometimes occur. If you have any problems or questions after discharge, call your caregiver. HOME CARE INSTRUCTIONS   Remove the bandage (dressing) over your chest tube site as directed by your caregiver.  It is normal to be sore for a couple weeks following surgery. See your caregiver if this seems to be getting worse rather than better.  Only take over-the-counter or prescription medicines for pain, discomfort, or fever as directed by your caregiver. It is very important to take pain medicine when you need it so that you will cough and breathe deeply enough to clear mucus (phlegm) and expand your lungs.  If it hurts to cough, hold a pillow against your chest when you cough. This may help with the discomfort. In spite of the discomfort, cough frequently, as this helps protect against getting an infection in your lung (pneumonia).  Taking deep breaths keeps lungs inflated and protects against pneumonia. Most patients will go home with an incentive spirometer that encourages deep breathing.  You may resume a normal diet and activities as directed.  Use showers for bathing until you see your caregiver, or as instructed.  Change dressings if necessary or as directed.  Avoid lifting or driving until you are instructed otherwise.  Make an appointment to see your caregiver for stitch (suture) or staple removal when instructed.  Do not travel by airplane for 2 weeks after the chest tube is removed. SEEK MEDICAL CARE IF:   You are bleeding from your wounds.  You have redness, swelling, or increasing pain in the wounds.  Your heartbeat  feels irregular or very fast.  There is pus coming from your wounds.  There is a bad smell coming from the wound or dressing. SEEK IMMEDIATE MEDICAL CARE IF:   You have a fever.  You develop a rash.  You have difficulty breathing  You develop any reaction or side effects to medicines given. MAKE SURE YOU:   Understand these instructions.  Will watch your condition.  Will get help right away if you are not doing well or get worse. Document Released: 03/18/2005 Document Revised: 11/21/2011 Document Reviewed: 02/16/2011 [facility] Patient Information 2015 [place], [place]. This information is not intended to replace advice given to you by your health care provider. Make sure you discuss any questions you have with your health care provider.  Amiodarone tablets What is this medicine? AMIODARONE (a MEE oh da rone) is an antiarrhythmic drug. It helps make your heart beat regularly. Because of the side effects caused by this medicine, it is only used when other medicines have not worked. It is usually used for heartbeat problems that may be life threatening. This medicine may be used for other purposes; ask your health care provider or pharmacist if you have questions. COMMON BRAND NAME(S): Cordarone, Pacerone What should I tell my health care provider before I take this medicine? They need to know if you have any of these conditions: -liver disease -lung disease -other heart problems -thyroid disease -an unusual or allergic reaction to amiodarone, iodine, other medicines, foods, dyes, or preservatives -pregnant or trying to get pregnant -breast-feeding How should I use this medicine? Take this medicine by mouth with a glass of water.  Follow the directions on the prescription label. You can take this medicine with or without food. However, you should always take it the same way each time. Take your doses at regular intervals. Do not take your medicine more often than directed. Do not  stop taking except on the advice of your doctor or health care professional. A special MedGuide will be given to you by the pharmacist with each prescription and refill. Be sure to read this information carefully each time. Talk to your pediatrician regarding the use of this medicine in children. Special care may be needed. Overdosage: If you think you have taken too much of this medicine contact a poison control center or emergency room at once. NOTE: This medicine is only for you. Do not share this medicine with others. What if I miss a dose? If you miss a dose, take it as soon as you can. If it is almost time for your next dose, take only that dose. Do not take double or extra doses. What may interact with this medicine? Do not take this medicine with any of the following medications: -abarelix -apomorphine -arsenic trioxide -certain antibiotics like erythromycin, gemifloxacin, levofloxacin, pentamidine -certain medicines for depression like amoxapine, tricyclic antidepressants -certain medicines for fungal infections like fluconazole, itraconazole, ketoconazole, posaconazole, voriconazole -certain medicines for irregular heart beat like disopyramide, dofetilide, dronedarone, ibutilide, propafenone, sotalol -certain medicines for malaria like chloroquine, halofantrine -cisapride -droperidol -haloperidol -hawthorn -maprotiline -methadone -phenothiazines like chlorpromazine, mesoridazine, thioridazine -pimozide -ranolazine -red yeast rice -vardenafil -ziprasidone This medicine may also interact with the following medications: -antiviral medicines for HIV or AIDS -certain medicines for blood pressure, heart disease, irregular heart beat -certain medicines for cholesterol like atorvastatin, cerivastatin, lovastatin, simvastatin -certain medicines for hepatitis C like sofosbuvir and ledipasvir; sofosbuvir -certain medicines for seizures like phenytoin -certain medicines for thyroid  problems -certain medicines that treat or prevent blood clots like warfarin -cholestyramine -cimetidine -clopidogrel -cyclosporine -dextromethorphan -diuretics -fentanyl -general anesthetics -grapefruit juice -lidocaine -loratadine -methotrexate -other medicines that prolong the QT interval (cause an abnormal heart rhythm) -procainamide -quinidine -rifabutin, rifampin, or rifapentine -St. John's Wort -trazodone This list may not describe all possible interactions. Give your health care provider a list of all the medicines, herbs, non-prescription drugs, or dietary supplements you use. Also tell them if you smoke, drink alcohol, or use illegal drugs. Some items may interact with your medicine. What should I watch for while using this medicine? Your condition will be monitored closely when you first begin therapy. Often, this drug is first started in a hospital or other monitored health care setting. Once you are on maintenance therapy, visit your doctor or health care professional for regular checks on your progress. Because your condition and use of this medicine carry some risk, it is a good idea to carry an identification card, necklace or bracelet with details of your condition, medications, and doctor or health care professional. Dennis Bast may get drowsy or dizzy. Do not drive, use machinery, or do anything that needs mental alertness until you know how this medicine affects you. Do not stand or sit up quickly, especially if you are an older patient. This reduces the risk of dizzy or fainting spells. This medicine can make you more sensitive to the sun. Keep out of the sun. If you cannot avoid being in the sun, wear protective clothing and use sunscreen. Do not use sun lamps or tanning beds/booths. You should have regular eye exams before and during treatment. Call your doctor if you have blurred  vision, see halos, or your eyes become sensitive to light. Your eyes may get dry. It may be  helpful to use a lubricating eye solution or artificial tears solution. If you are going to have surgery or a procedure that requires contrast dyes, tell your doctor or health care professional that you are taking this medicine. What side effects may I notice from receiving this medicine? Side effects that you should report to your doctor or health care professional as soon as possible: -allergic reactions like skin rash, itching or hives, swelling of the face, lips, or tongue -blue-gray coloring of the skin -blurred vision, seeing blue green halos, increased sensitivity of the eyes to light -breathing problems -chest pain -dark urine -fast, irregular heartbeat -feeling faint or light-headed -intolerance to heat or cold -nausea or vomiting -pain and swelling of the scrotum -pain, tingling, numbness in feet, hands -redness, blistering, peeling or loosening of the skin, including inside the mouth -spitting up blood -stomach pain -sweating -unusual or uncontrolled movements of body -unusually weak or tired -weight gain or loss -yellowing of the eyes or skin Side effects that usually do not require medical attention (report to your doctor or health care professional if they continue or are bothersome): -change in sex drive or performance -constipation -dizziness -headache -loss of appetite -trouble sleeping This list may not describe all possible side effects. Call your doctor for medical advice about side effects. You may report side effects to FDA at 1-800-FDA-1088. Where should I keep my medicine? Keep out of the reach of children. Store at room temperature between 20 and 25 degrees C (68 and 77 degrees F). Protect from light. Keep container tightly closed. Throw away any unused medicine after the expiration date. NOTE: This sheet is a summary. It may not cover all possible information. If you have questions about this medicine, talk to your doctor, pharmacist, or health care  provider.  2015, [employer]. (2013-12-02 19:48:11)  You develop lightheadedness or feel faint.  You develop shortness of breath or chest pain.  Tamsulosin capsules What is this medicine? TAMSULOSIN (tam SOO loe sin) is used to treat enlargement of the prostate gland in men, a condition called benign prostatic hyperplasia or BPH. It is not for use in women. It works by relaxing muscles in the prostate and bladder neck. This improves urine flow and reduces BPH symptoms. This medicine may be used for other purposes; ask your health care provider or pharmacist if you have questions. COMMON BRAND NAME(S): Flomax What should I tell my health care provider before I take this medicine? They need to know if you have any of the following conditions: -advanced kidney disease -advanced liver disease -low blood pressure -prostate cancer -an unusual or allergic reaction to tamsulosin, sulfa drugs, other medicines, foods, dyes, or preservatives -pregnant or trying to get pregnant -breast-feeding How should I use this medicine? Take this medicine by mouth about 30 minutes after the same meal every day. Follow the directions on the prescription label. Swallow the capsules whole with a glass of water. Do not crush, chew, or open capsules. Do not take your medicine more often than directed. Do not stop taking your medicine unless your doctor tells you to. Talk to your pediatrician regarding the use of this medicine in children. Special care may be needed. Overdosage: If you think you have taken too much of this medicine contact a poison control center or emergency room at once. NOTE: This medicine is only for you. Do not share this  medicine with others. What if I miss a dose? If you miss a dose, take it as soon as you can. If it is almost time for your next dose, take only that dose. Do not take double or extra doses. If you stop taking your medicine for several days or more, ask your doctor or  health care professional what dose you should start back on. What may interact with this medicine? -cimetidine -fluoxetine -ketoconazole -medicines for erectile disfunction like sildenafil, tadalafil, vardenafil -medicines for high blood pressure -other alpha-blockers like alfuzosin, doxazosin, phentolamine, phenoxybenzamine, prazosin, terazosin -warfarin This list may not describe all possible interactions. Give your health care provider a list of all the medicines, herbs, non-prescription drugs, or dietary supplements you use. Also tell them if you smoke, drink alcohol, or use illegal drugs. Some items may interact with your medicine. What should I watch for while using this medicine? Visit your doctor or health care professional for regular check ups. You will need lab work done before you start this medicine and regularly while you are taking it. Check your blood pressure as directed. Ask your health care professional what your blood pressure should be, and when you should contact him or her. This medicine may make you feel dizzy or lightheaded. This is more likely to happen after the first dose, after an increase in dose, or during hot weather or exercise. Drinking alcohol and taking some medicines can make this worse. Do not drive, use machinery, or do anything that needs mental alertness until you know how this medicine affects you. Do not sit or stand up quickly. If you begin to feel dizzy, sit down until you feel better. These effects can decrease once your body adjusts to the medicine. Contact your doctor or health care professional right away if you have an erection that lasts longer than 4 hours or if it becomes painful. This may be a sign of a serious problem and must be treated right away to prevent permanent damage. If you are thinking of having cataract surgery, tell your eye surgeon that you have taken this medicine. What side effects may I notice from receiving this medicine? Side  effects that you should report to your doctor or health care professional as soon as possible: -allergic reactions like skin rash or itching, hives, swelling of the lips, mouth, tongue, or throat -breathing problems -change in vision -feeling faint or lightheaded -irregular heartbeat -prolonged or painful erection -weakness Side effects that usually do not require medical attention (report to your doctor or health care professional if they continue or are bothersome): -back pain -change in sex drive or performance -constipation, nausea or vomiting -cough -drowsy -runny or stuffy nose -trouble sleeping This list may not describe all possible side effects. Call your doctor for medical advice about side effects. You may report side effects to FDA at 1-800-FDA-1088. Where should I keep my medicine? Keep out of the reach of children. Store at room temperature between 15 and 30 degrees C (59 and 86 degrees F). Throw away any unused medicine after the expiration date. NOTE: This sheet is a summary. It may not cover all possible information. If you have questions about this medicine, talk to your doctor, pharmacist, or health care provider.  2015, [employer] Standard. (2012-08-29 14:11:34)   Pantoprazole tablets What is this medicine? PANTOPRAZOLE (pan TOE pra zole) prevents the production of acid in the stomach. It is used to treat gastroesophageal reflux disease (GERD), inflammation of the esophagus, and Zollinger-Ellison syndrome. This  medicine may be used for other purposes; ask your health care provider or pharmacist if you have questions. COMMON BRAND NAME(S): Protonix What should I tell my health care provider before I take this medicine? They need to know if you have any of these conditions: -liver disease -low levels of magnesium in the blood -an unusual or allergic reaction to omeprazole, lansoprazole, pantoprazole, rabeprazole, other medicines, foods, dyes, or  preservatives -pregnant or trying to get pregnant -breast-feeding How should I use this medicine? Take this medicine by mouth. Swallow the tablets whole with a drink of water. Follow the directions on the prescription label. Do not crush, break, or chew. Take your medicine at regular intervals. Do not take your medicine more often than directed. Talk to your pediatrician regarding the use of this medicine in children. While this drug may be prescribed for children as young as 5 years for selected conditions, precautions do apply. Overdosage: If you think you have taken too much of this medicine contact a poison control center or emergency room at once. NOTE: This medicine is only for you. Do not share this medicine with others. What if I miss a dose? If you miss a dose, take it as soon as you can. If it is almost time for your next dose, take only that dose. Do not take double or extra doses. What may interact with this medicine? Do not take this medicine with any of the following medications: -atazanavir -nelfinavir This medicine may also interact with the following medications: -ampicillin -delavirdine -digoxin -diuretics -iron salts -medicines for fungal infections like ketoconazole, itraconazole and voriconazole -warfarin This list may not describe all possible interactions. Give your health care provider a list of all the medicines, herbs, non-prescription drugs, or dietary supplements you use. Also tell them if you smoke, drink alcohol, or use illegal drugs. Some items may interact with your medicine. What should I watch for while using this medicine? It can take several days before your stomach pain gets better. Check with your doctor or health care professional if your condition does not start to get better, or if it gets worse. You may need blood work done while you are taking this medicine. What side effects may I notice from receiving this medicine? Side effects that you should  report to your doctor or health care professional as soon as possible: -allergic reactions like skin rash, itching or hives, swelling of the face, lips, or tongue -bone, muscle or joint pain -breathing problems -chest pain or chest tightness -dark yellow or brown urine -dizziness -fast, irregular heartbeat -feeling faint or lightheaded -fever or sore throat -muscle spasm -palpitations -redness, blistering, peeling or loosening of the skin, including inside the mouth -seizures -tremors -unusual bleeding or bruising -unusually weak or tired -yellowing of the eyes or skin Side effects that usually do not require medical attention (Report these to your doctor or health care professional if they continue or are bothersome.): -constipation -diarrhea -dry mouth -headache -nausea This list may not describe all possible side effects. Call your doctor for medical advice about side effects. You may report side effects to FDA at 1-800-FDA-1088. Where should I keep my medicine? Keep out of the reach of children. Store at room temperature between 15 and 30 degrees C (59 and 86 degrees F). Protect from light and moisture. Throw away any unused medicine after the expiration date. NOTE: This sheet is a summary. It may not cover all possible information. If you have questions about this medicine, talk to  your doctor, pharmacist, or health care provider.  2015, [employer] Standard. (2012-06-27 16:40:16)  Metoprolol tablets What is this medicine? METOPROLOL (me TOE proe lole) is a beta-blocker. Beta-blockers reduce the workload on the heart and help it to beat more regularly. This medicine is used to treat high blood pressure and to prevent chest pain. It is also used to after a heart attack and to prevent an additional heart attack from occurring. This medicine may be used for other purposes; ask your health care provider or pharmacist if you have questions. COMMON BRAND NAME(S): Lopressor What  should I tell my health care provider before I take this medicine? They need to know if you have any of these conditions: -diabetes -heart or vessel disease like slow heart rate, worsening heart failure, heart block, sick sinus syndrome or Raynaud's disease -kidney disease -liver disease -lung or breathing disease, like asthma or emphysema -pheochromocytoma -thyroid disease -an unusual or allergic reaction to metoprolol, other beta-blockers, medicines, foods, dyes, or preservatives -pregnant or trying to get pregnant -breast-feeding How should I use this medicine? Take this medicine by mouth with a drink of water. Follow the directions on the prescription label. Take this medicine immediately after meals. Take your doses at regular intervals. Do not take more medicine than directed. Do not stop taking this medicine suddenly. This could lead to serious heart-related effects. Talk to your pediatrician regarding the use of this medicine in children. Special care may be needed. Overdosage: If you think you have taken too much of this medicine contact a poison control center or emergency room at once. NOTE: This medicine is only for you. Do not share this medicine with others. What if I miss a dose? If you miss a dose, take it as soon as you can. If it is almost time for your next dose, take only that dose. Do not take double or extra doses. What may interact with this medicine? This medicine may interact with the following medications: -certain medicines for blood pressure, heart disease, irregular heart beat -certain medicines for depression like monoamine oxidase (MAO) inhibitors, fluoxetine, or paroxetine -clonidine -dobutamine -epinephrine -isoproterenol -reserpine This list may not describe all possible interactions. Give your health care provider a list of all the medicines, herbs, non-prescription drugs, or dietary supplements you use. Also tell them if you smoke, drink alcohol, or use  illegal drugs. Some items may interact with your medicine. What should I watch for while using this medicine? Visit your doctor or health care professional for regular check ups. Contact your doctor right away if your symptoms worsen. Check your blood pressure and pulse rate regularly. Ask your health care professional what your blood pressure and pulse rate should be, and when you should contact them. You may get drowsy or dizzy. Do not drive, use machinery, or do anything that needs mental alertness until you know how this medicine affects you. Do not sit or stand up quickly, especially if you are an older patient. This reduces the risk of dizzy or fainting spells. Contact your doctor if these symptoms continue. Alcohol may interfere with the effect of this medicine. Avoid alcoholic drinks. What side effects may I notice from receiving this medicine? Side effects that you should report to your doctor or health care professional as soon as possible: -allergic reactions like skin rash, itching or hives -cold or numb hands or feet -depression -difficulty breathing -faint -fever with sore throat -irregular heartbeat, chest pain -rapid weight gain -swollen legs or ankles Side effects  that usually do not require medical attention (report to your doctor or health care professional if they continue or are bothersome): -anxiety or nervousness -change in sex drive or performance -dry skin -headache -nightmares or trouble sleeping -short term memory loss -stomach upset or diarrhea -unusually tired This list may not describe all possible side effects. Call your doctor for medical advice about side effects. You may report side effects to FDA at 1-800-FDA-1088. Where should I keep my medicine? Keep out of the reach of children. Store at room temperature between 15 and 30 degrees C (59 and 86 degrees F). Throw away any unused medicine after the expiration date. NOTE: This sheet is a summary. It may  not cover all possible information. If you have questions about this medicine, talk to your doctor, pharmacist, or health care provider.  2015, [employer]. (2013-05-03 14:40:36)

## 2014-12-04 ENCOUNTER — Other Ambulatory Visit: Payer: Self-pay | Admitting: Thoracic Surgery (Cardiothoracic Vascular Surgery)

## 2014-12-04 DIAGNOSIS — J9859 Other diseases of mediastinum, not elsewhere classified: Secondary | ICD-10-CM

## 2014-12-10 ENCOUNTER — Ambulatory Visit: Payer: Self-pay | Admitting: Thoracic Surgery (Cardiothoracic Vascular Surgery)

## 2014-12-11 ENCOUNTER — Ambulatory Visit (INDEPENDENT_AMBULATORY_CARE_PROVIDER_SITE_OTHER): Payer: Self-pay | Admitting: Thoracic Surgery (Cardiothoracic Vascular Surgery)

## 2014-12-11 ENCOUNTER — Ambulatory Visit
Admission: RE | Admit: 2014-12-11 | Discharge: 2014-12-11 | Disposition: A | Discharge status: Home / Self Care | Payer: Medicare Other | Source: Ambulatory Visit | Attending: Thoracic Surgery (Cardiothoracic Vascular Surgery) | Admitting: Thoracic Surgery (Cardiothoracic Vascular Surgery)

## 2014-12-11 ENCOUNTER — Encounter: Payer: Self-pay | Admitting: Thoracic Surgery (Cardiothoracic Vascular Surgery)

## 2014-12-11 DIAGNOSIS — E328 Other diseases of thymus: Secondary | ICD-10-CM

## 2014-12-11 DIAGNOSIS — R222 Localized swelling, mass and lump, trunk: Secondary | ICD-10-CM | POA: Diagnosis not present

## 2014-12-11 DIAGNOSIS — J9859 Other diseases of mediastinum, not elsewhere classified: Secondary | ICD-10-CM

## 2014-12-11 DIAGNOSIS — J9811 Atelectasis: Secondary | ICD-10-CM | POA: Diagnosis not present

## 2014-12-11 DIAGNOSIS — Z8546 Personal history of malignant neoplasm of prostate: Secondary | ICD-10-CM | POA: Diagnosis not present

## 2014-12-11 HISTORY — DX: Other diseases of thymus: E32.8

## 2014-12-11 MED ORDER — ZOLPIDEM TARTRATE 5 MG PO TABS: 5.0000 mg | ORAL_TABLET | Freq: Every evening | ORAL | Status: DC | PRN

## 2014-12-11 NOTE — Progress Notes (Signed)
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  He is a 74 year old gentleman who was found to have a mediastinal mass on a CT scan as part of her workup for prostate cancer. We did a left thoracoscopic thymectomy on 11/26/2014. 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 Postoperatively he had some atrial fibrillation, but otherwise did well and went home on day 4.  Of note in the operating room we were initially unable to pass a Foley. Dr. Matilde Sprang did a balloon dilatation of a stricture and placed the Foley catheter.  He says that he feels short of breath with exertion. He has difficulty lying flat on his back due to some shortness of breath as well as feeling his heart beating against his chest wall. He's had difficulty sleeping. He also is having urinary frequency and nocturia.  He is not having any incisional pain. He is not taking any pain medications since leaving the hospital.  Past Medical History  Diagnosis Date  . Atrial fibrillation   . Colon polyps     hyperplastic and tubular adenomatous  . Thrombocytopenia   . Radiation     for prostate cancer  . Neuromuscular disorder   . Prostate cancer   . Erectile dysfunction following radical prostatectomy   . Mediastinal mass     per CT CHEST 11/07/14 @ [facility].[name] KitchenANTERIOR...4.2 X 3.3 cm soft tissue  . Dysrhythmia     hx at fib- ablation 8 yrs ago      Current Outpatient Prescriptions  Medication Sig Dispense Refill  . amiodarone (PACERONE) 200 MG tablet Take 1 tablet (200 mg total) by mouth 2 (two) times daily. for one week;then take Amiodarone 200 mg by mouth daily thereafter 30 tablet 1  . ibuprofen (ADVIL,MOTRIN) 400 MG tablet Take 1 tablet (400 mg total) by mouth 2 (two) times daily with a meal. For 3 days then stop.    . metoprolol tartrate (LOPRESSOR) 25 MG tablet Take 0.5  tablets (12.5 mg total) by mouth 2 (two) times daily. 30 tablet 1  . tamsulosin (FLOMAX) 0.4 MG CAPS capsule Take 1 capsule (0.4 mg total) by mouth daily. 30 capsule 0  . zolpidem (AMBIEN) 5 MG tablet Take 1 tablet (5 mg total) by mouth at bedtime as needed for sleep. 30 tablet 0   No current facility-administered medications for this visit.    Physical Exam BP 134/87 mmHg  Pulse 70  Resp 20  Ht 5\' 10"  (1.778 m)  Wt 200 lb (90.719 kg)  BMI 28.70 kg/m2  SpO97 26% 74 year old man in no acute distress Alert and oriented 3 with no focal neurologic deficits Diminished breath sounds at left base, otherwise clear Cardiac regular rate and rhythm Trace edema both ankles  Diagnostic Tests: Chest x-ray shows some elevation of left hemidiaphragm with left lower lobe atelectasis  Impression: 74 year old man who is now about 2 weeks out from a thoracoscopic thymectomy. 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  He is not having any issues with postoperative pain. He does have some shortness of breath and general malaise. A big issue is his inability to sleep. He is having frequent nocturia about every hour and a half at night and also feels like his  heart is pounding in his chest. I reassured him that this is a common sensation after having a mediastinal surgery. I did advise him to check with Dr. Alinda Money about the frequent urination.  He also has some general malaise, which goes along with the surgery. He's noticed some mild swelling in his ankles which is probably related to fluid retention. I advised him to watch his salt intake.  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 Hopefully this will improve over time. That can often take 6-9 months to resolve.  Plan: He will check with Dr. Alinda Money about his frequent urination  I will see him back in about 3 weeks with a PA and lateral chest x-ray to check on his progress.  Melrose Nakayama, MD [facility] [phone]

## 2014-12-16 ENCOUNTER — Ambulatory Visit: Payer: Medicare Other | Admitting: Thoracic Surgery (Cardiothoracic Vascular Surgery)

## 2014-12-16 DIAGNOSIS — N320 Bladder-neck obstruction: Secondary | ICD-10-CM | POA: Diagnosis not present

## 2014-12-16 DIAGNOSIS — R350 Frequency of micturition: Secondary | ICD-10-CM | POA: Diagnosis not present

## 2014-12-21 NOTE — Progress Notes (Signed)
Cardiology Office Note   Date:  12/22/2014   ID:  [name] [name], DOB 08-10-1941, MRN [id number]  PCP:  Kennon Portela, MD  Cardiologist:  Dr. Darlin Coco     Chief Complaint  Patient presents with  . 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  He was admitted last month for video-assisted thoracoscopy with resection of an anterior mediastinal mass.  On post op day #1, he went into AFib with RVR.  The patient has a past history of atrial fibrillation. He had had 3 prior cardioversions and had been on long-term Coumadin.  Eight years ago he saw Dr. Ola Spurr at [facility] and underwent an ablation of his atrial fibrillation. He had had an excellent response and he had no recurrent atrial fibrillation until recently.  In the hospital, he was treated with IV Dilt for rate control.  He was also placed on Amiodarone.  His initial ECG was suggestive of pericarditis.  He was treated with Ibuprofen x 1 week.  He converted to NSR.  CHADS2-VASc=1 (age > 49).   His echo demonstrated normal LVF.  Amiodarone was continued at DC.  He returns for FU.    After discharge, he remained quite fatigued from his surgery. He also had difficulty in taking a deep breath and dyspnea with activity. He has seen Dr. Roxan Hockey back in follow-up. 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 Overall, his symptoms are improved. He denies any further symptoms consistent with his previous atrial fibrillation syndrome. He denies chest pain, syncope, orthopnea, PND or edema. His prescription of amiodarone ran out about a week ago. He did not refill this.  Studies/Reports Reviewed Today:  Echo 11/28/14 - EF 50% to 55%. Wall motion was normal - Atrial septum: No defect or patent foramen ovale was identified.   Past Medical History  Diagnosis  Date  . Atrial fibrillation   . Colon polyps     hyperplastic and tubular adenomatous  . Thrombocytopenia   . Radiation     for prostate cancer  . Neuromuscular disorder   . Prostate cancer   . Erectile dysfunction following radical prostatectomy   . Mediastinal mass     per CT CHEST 11/07/14 @ [facility].[name] KitchenANTERIOR...4.2 X 3.3 cm soft tissue  . Dysrhythmia     hx at fib- ablation 8 yrs ago    Past Surgical History  Procedure Laterality Date  . Knee arthroscopy      left  . Tonsillectomy    . Back surgery    . Robot assisted laparoscopic radical prostatectomy  2009    radiation Tx 2011  . Vasectomy    . Colonoscopy    . Polypectomy    . Atrial ablation surgery      for a fib x 5 years ago  . Flexible sigmoidoscopy  02/10/2012    Procedure: FLEXIBLE SIGMOIDOSCOPY;  Surgeon: Irene Shipper, MD;  Location: [facility];  Service: Endoscopy;  Laterality: N/A;  need APC   . Prostate surgery    . Video assisted thoracoscopy Left 11/26/2014    Procedure: VIDEO ASSISTED THORACOSCOPY;  Surgeon: Melrose Nakayama, MD;  Location: [facility];  Service: Thoracic;  Laterality: Left;  . Resection of mediastinal mass N/A 11/26/2014    Procedure: RESECTION OF ANTERIOR MEDIASTINAL MASS;  Surgeon: Melrose Nakayama, MD;  Location: [facility];  Service: Thoracic;  Laterality: N/A;  . Lead removal N/A 11/26/2014    Procedure: CRYO INTERCOSTAL NERVE BLOCK;  Surgeon: Melrose Nakayama, MD;  Location: [facility];  Service: Thoracic;  Laterality: N/A;  . Cystoscopy N/A 11/26/2014    Procedure: Erlene Quan;  Surgeon: Bjorn Loser, MD;  Location: [facility];  Service: Urology;  Laterality: N/A;  . Cystoscopy with urethral dilatation N/A 11/26/2014    Procedure: CYSTOSCOPY WITH URETHRAL DILATATION WITH INSERTION OF FOLEY;  Surgeon: Bjorn Loser, MD;  Location: [facility];  Service: Urology;  Laterality: N/A;     Current Outpatient Prescriptions  Medication Sig Dispense Refill  . ibuprofen  (ADVIL,MOTRIN) 400 MG tablet Take 1 tablet (400 mg total) by mouth 2 (two) times daily with a meal. For 3 days then stop.    . metoprolol tartrate (LOPRESSOR) 25 MG tablet Take 0.5 tablets (12.5 mg total) by mouth 2 (two) times daily. 30 tablet 1  . zolpidem (AMBIEN) 5 MG tablet Take 1 tablet (5 mg total) by mouth at bedtime as needed for sleep. 30 tablet 0   No current facility-administered medications for this visit.    Allergies:   Review of patient's allergies indicates no known allergies.    Social History:  The patient  reports that he has never smoked. He has never used smokeless tobacco. He reports that he does not drink alcohol or use illicit drugs.   Family History:  The patient's family history includes Heart attack in his mother; Heart disease in his mother; Prostate cancer in his father. There is no history of Colon cancer.    ROS:   Please see the history of present illness.   Review of Systems  All other systems reviewed and are negative.     PHYSICAL EXAM: VS:  BP 90/60 mmHg  Pulse 65  Ht 5\' 10"  (1.778 m)  Wt 193 lb (87.544 kg)  BMI 27.69 kg/m2    Wt Readings from Last 3 Encounters:  12/22/14 193 lb (87.544 kg)  12/11/14 200 lb (90.719 kg)  11/29/14 199 lb 3.2 oz (90.357 kg)     GEN: Well nourished, well developed, in no acute distress HEENT: normal Neck: no JVD, no masses Cardiac:  Normal S1/S2, RRR; no murmur ,  no rubs or gallops, no edema  Respiratory:  clear to auscultation bilaterally, no wheezing, rhonchi or rales. GI: soft, nontender, nondistended, + BS MS: no deformity or atrophy Skin: warm and dry  Neuro:  CNs II-XII intact, Strength and sensation are intact Psych: Normal affect   EKG:  EKG is ordered today.  It demonstrates:   NSR, HR 65, normal axis, nonspecific ST-T wave changes   Recent Labs: 11/28/2014: ALT 15; BUN 15; Creatinine 0.84; Hemoglobin 12.5*; Platelets 163; Potassium 3.8; Sodium 135    Lipid Panel    Component Value  Date/Time   CHOL 197 08/15/2013 1026   TRIG 98 08/15/2013 1026   HDL 40 08/15/2013 1026   CHOLHDL 4.9 08/15/2013 1026   VLDL 20 08/15/2013 1026   LDLCALC 137* 08/15/2013 1026      ASSESSMENT AND PLAN:  Paroxysmal atrial fibrillation Maintaining normal sinus rhythm. I suspect his atrial fibrillation was related to cholinergic surge in the setting of his surgery. He does not likely need to remain on amiodarone long-term. I would recommend continuing amiodarone for at least one more month. I have also asked him to remain on metoprolol for now. If he remains in sinus rhythm when he sees  Dr. Mare Ferrari in follow-up, he can remain off of amiodarone at that point  Mediastinal mass S/P thymectomy  Follow-up with Dr. Roxan Hockey as planned.   Current medicines are reviewed at length with the patient today.  The patient does not have concerns regarding medicines.  The following changes have been made:  As above.   Labs/ tests ordered today include:   Orders Placed This Encounter  Procedures  . EKG 12-Lead    Disposition:   FU with Dr. Darlin Coco 6-8 weeks.    Signed, Versie Starks, MHS 12/22/2014 12:33 PM    [facility] [place], [place], [place]  [address] Phone: [phone]; Fax: [phone]

## 2014-12-22 ENCOUNTER — Encounter: Payer: Self-pay | Admitting: Physician Assistant

## 2014-12-22 ENCOUNTER — Ambulatory Visit (INDEPENDENT_AMBULATORY_CARE_PROVIDER_SITE_OTHER): Payer: Medicare Other | Admitting: Physician Assistant

## 2014-12-22 VITALS — BP 90/60 | HR 65 | Ht 70.0 in | Wt 193.0 lb

## 2014-12-22 DIAGNOSIS — Z9889 Other specified postprocedural states: Secondary | ICD-10-CM

## 2014-12-22 DIAGNOSIS — I480 Paroxysmal atrial fibrillation: Secondary | ICD-10-CM

## 2014-12-22 DIAGNOSIS — R222 Localized swelling, mass and lump, trunk: Secondary | ICD-10-CM | POA: Diagnosis not present

## 2014-12-22 DIAGNOSIS — J9859 Other diseases of mediastinum, not elsewhere classified: Secondary | ICD-10-CM

## 2014-12-22 DIAGNOSIS — Z9089 Acquired absence of other organs: Secondary | ICD-10-CM

## 2014-12-22 MED ORDER — AMIODARONE HCL 200 MG PO TABS: 200.0000 mg | ORAL_TABLET | Freq: Every day | ORAL | Status: DC

## 2014-12-22 MED ORDER — METOPROLOL TARTRATE 25 MG PO TABS: 12.5000 mg | ORAL_TABLET | Freq: Two times a day (BID) | ORAL | Status: DC

## 2014-12-22 NOTE — Patient Instructions (Signed)
RESTART AMIODARONE 200 MG 1 TABLET DAILY FOR 1 MONTH; THEN STOP  A REFILL FOR METOPROLOL WAS SENT IN AS WELL TODAY  Your physician recommends that you schedule a follow-up appointment in 02/03/15 8 AM WITH DR. Mare Ferrari

## 2015-01-05 ENCOUNTER — Other Ambulatory Visit: Payer: Self-pay | Admitting: Thoracic Surgery (Cardiothoracic Vascular Surgery)

## 2015-01-05 DIAGNOSIS — I4891 Unspecified atrial fibrillation: Secondary | ICD-10-CM

## 2015-01-06 ENCOUNTER — Other Ambulatory Visit: Payer: Self-pay | Admitting: *Deleted

## 2015-01-06 ENCOUNTER — Ambulatory Visit (INDEPENDENT_AMBULATORY_CARE_PROVIDER_SITE_OTHER): Payer: Self-pay | Admitting: Thoracic Surgery (Cardiothoracic Vascular Surgery)

## 2015-01-06 ENCOUNTER — Ambulatory Visit
Admission: RE | Admit: 2015-01-06 | Discharge: 2015-01-06 | Disposition: A | Discharge status: Home / Self Care | Payer: Medicare Other | Source: Ambulatory Visit | Attending: Thoracic Surgery (Cardiothoracic Vascular Surgery) | Admitting: Thoracic Surgery (Cardiothoracic Vascular Surgery)

## 2015-01-06 ENCOUNTER — Encounter: Payer: Self-pay | Admitting: Thoracic Surgery (Cardiothoracic Vascular Surgery)

## 2015-01-06 VITALS — BP 113/70 | HR 66 | Resp 20 | Ht 70.0 in | Wt 193.0 lb

## 2015-01-06 DIAGNOSIS — Z9889 Other specified postprocedural states: Secondary | ICD-10-CM

## 2015-01-06 DIAGNOSIS — Z9089 Acquired absence of other organs: Secondary | ICD-10-CM

## 2015-01-06 DIAGNOSIS — I4891 Unspecified atrial fibrillation: Secondary | ICD-10-CM

## 2015-01-06 DIAGNOSIS — E328 Other diseases of thymus: Secondary | ICD-10-CM

## 2015-01-06 DIAGNOSIS — R5383 Other fatigue: Secondary | ICD-10-CM

## 2015-01-06 DIAGNOSIS — R0602 Shortness of breath: Secondary | ICD-10-CM | POA: Diagnosis not present

## 2015-01-06 LAB — TSH: TSH: 2.405 u[IU]/mL (ref 0.350–4.500)

## 2015-01-06 MED ORDER — AZITHROMYCIN 250 MG PO TABS: 250.0000 mg | ORAL_TABLET | Freq: Every day | ORAL | Status: DC

## 2015-01-06 MED ORDER — ZOLPIDEM TARTRATE 5 MG PO TABS: 5.0000 mg | ORAL_TABLET | Freq: Every evening | ORAL | Status: DC | PRN

## 2015-01-06 NOTE — Progress Notes (Signed)
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 He had some atrial fibrillation postoperatively and was discharged home on amiodarone. He also had to have a urethral stricture ballooned at the time of surgery.  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  I saw him in the office on March 31 and he had multiple complaints at that time. He complained of feeling short of breath and having a weak voice. He also was having frequent nocturia. He was having a lot of problems with insomnia. Finally he was also having problems with swelling in his ankles.  He saw Richardson Dopp from cardiology about 2 weeks ago. He has not had any atrial fibrillation since discharge from the hospital.  Today he says he feels quite discouraged. He was up to be making more rapid progress. He still gets short of breath with walking up a flight of stairs or with walking rapidly to the bathroom and back. He is still using his incentive spirometer. He can now get it up to about 1250 where as previously he was only getting up to 750. He still complains of a weak voice. 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 He also complains of lack of energy.  He says he is not having any pain but does have some paresthesias in his left chest  I gave him Ambien at his last visit and his insomnia has improved with that. He is requesting a refill.  Past Medical History  Diagnosis Date  . Atrial fibrillation   . Colon polyps     hyperplastic and tubular adenomatous  . Thrombocytopenia   . Radiation     for prostate cancer  . Neuromuscular disorder   . Prostate cancer   . Erectile dysfunction following radical prostatectomy   . Mediastinal mass     per CT CHEST 11/07/14 @ [facility].[name] KitchenANTERIOR...4.2 X 3.3 cm soft tissue  . Dysrhythmia     hx at fib- ablation 8 yrs ago      Current Outpatient Prescriptions  Medication Sig Dispense Refill  . ibuprofen (ADVIL,MOTRIN) 400 MG tablet Take 1 tablet (400 mg total) by mouth 2 (two) times daily with a meal. For 3 days then stop.    . metoprolol tartrate (LOPRESSOR) 25 MG tablet Take 0.5 tablets (12.5 mg total) by mouth 2 (two) times daily. 30 tablet 1  . zolpidem (AMBIEN) 5 MG tablet Take 1 tablet (5 mg total) by mouth at bedtime as needed for sleep. 30 tablet 5  . azithromycin (ZITHROMAX) 250 MG tablet Take 1 tablet (250 mg total) by mouth daily. Take 2 tablets on day 1, then 1 tablet daily 6 each 0   No current facility-administered medications for this visit.    Physical Exam BP 113/70 mmHg  Pulse 66  Resp 20  Ht 5\' 10"  (1.778 m)  Wt 193 lb (87.544 kg)  BMI 27.69 kg/m2  SpO51 44% 74 year old man in no acute distress Well-developed and well-nourished Alert and oriented 3 with no neurologic deficit\ Cardiac regular rate and rhythm normal S1 and S2 Diminished breath sounds at both bases Incisions well healed  Diagnostic Tests: Chest x-ray reviewed  it shows slight decrease in atelectasis of the left base and the left diaphragm may be slightly lower than it was on the last film  Impression: 74 year old gentleman who is now a little over a month out from a thoracoscopic thymectomy for what 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 He is having a slow recovery. This is primarily due to left phrenic nerve dysfunction. It is encouraging that his x-ray looks slightly better and his performance with the incentive spirometer has improved. Unfortunately, he is looking for very rapid improvement and I think this will only improve over a relatively long period of time, i.e. months rather than weeks. I suspect his voice will improve as his respiratory status does. He does have a frequent cough and there could be an infectious  component. Give him a trial of azithromycin to see if that helps his breathing any.  He complains of a general fatigue which really does not fit with the phrenic nerve hypothesis. Get a check a TSH level on him. I also am going to go ahead and stop his amiodarone as he is only on 200 mg a day and is almost 6 weeks out from surgery. I think his chance of recurrence at this point very low.  Plan:  Return in one month with a PA and lateral chest x-ray    Melrose Nakayama, MD [facility] [phone]

## 2015-01-07 DIAGNOSIS — H1132 Conjunctival hemorrhage, left eye: Secondary | ICD-10-CM | POA: Diagnosis not present

## 2015-01-08 NOTE — Op Note (Signed)
[name] [name], [name] [name]                 ACCOUNT NO.:  [id number]  MEDICAL RECORD NO.:  [id number]  LOCATION:  2W33C                        FACILITY:  [facility]  PHYSICIAN:  Revonda Standard. Roxan Hockey, M.D.DATE OF BIRTH:  [birth year]  DATE OF PROCEDURE:  11/26/2014 DATE OF DISCHARGE:  11/30/2014                              OPERATIVE REPORT   PREOPERATIVE DIAGNOSIS:  Anterior mediastinal mass.  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  PROCEDURE:  Left video-assisted thoracoscopy, resection of anterior mediastinal mass, cryoanalgesia of intercostal nerves 3 through 8.  SURGEON:  Revonda Standard. Roxan Hockey, MD  ASSISTANT:  [name], PA-C  ANESTHESIA:  General.  FINDINGS:  Unable to place Foley catheter, Dr. Matilde Sprang, performed balloon dilatation and catheter placement. Large pericardial fat pad obscured visualization of phrenic nerve on the left side.  Smooth-walled anterior mediastinal mass on frozen section consistent with a cyst of thymic origin.  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  He was advised to undergo surgical resection, the various surgical approaches as well as the indications, risks, benefits, and alternatives were discussed in detail with the patient.  He understood and accepted the risks and agreed to proceed.  We did discuss the use of cryoanalgesia for pain control.  He wished to have that done as well.  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  He was anesthetized and intubated with a double-lumen endotracheal tube.  Sequential compressive devices were placed on the calves for DVT prophylaxis.  An attempt was made to place a Foley catheter, however resistance was met.  Dr. Matilde Sprang from Urology was consulted and performed a cystoscopy and balloon dilatation and Foley catheter placement.  [name]  then was placed in supine position with the left chest, slightly elevated and the chest was prepped and draped in usual sterile fashion.  Single lung ventilation of the right lung was initiated and tolerated well throughout the procedure.  A small port-type incision was made in the anterior axillary line in the seventh intercostal space.  The chest was entered bluntly using a 5 mm port.   A scope was advanced into the chest. There was good isolation of the left lung.  There was a large pericardial fat pad which obscured visualization.  A working incision was made 4-5 cm in length in the fifth interspace.  No rib spreading was performed during the procedure.  Finally, a third incision was made in the third interspace, this was a small port type incision and a 5 mm port was placed through this incision as well.  Cryoanalgesia then was performed of the intercostal nerves from the 3rd through the 8th interspace.  A 2 cm segment of the cryoprobe was placed over the intercostal nerve below the overlying rib and taken to -70 degree Celsius for 2 minutes.  This process was repeated at each interspace. Next, attention was turned to the fat pad.  A portion of the fat pad had to be taken off the pericardium to allow visualization with the camera. This was  done using the Harmonic Scalpel, which was used for all of the mediastinal dissection as well.  After removing the fat pad overlying the pericardium, there was a large anterior mediastinal fat pad as well. The phrenic nerve could be visualized along the upper portion of the pericardium and the care was taken to keep the dissection well away from the phrenic nerve.  The Harmonic Scalpel was used to dissect the fat pad off the pericardium beginning in the area where the phrenic nerve could be visualized and then working inferiorly, then across and up the right side as far as it could be visualized.  Next, the fat pad was divided more superiorly and  dissected off the innominate vein.  There was a large feedings arterial vessel in this area that was divided with the Harmonic Scalpel and then clipped as the dissection was carried more to the midline, a large vein branch was seen coming off the innominate. This vessel was clipped on the vein side and divided with the Harmonic Scalpel on the side of the mass.  Harmonic Scalpel then was used to remove the remainder of the mass including the cervical extensions of the thymus up above the innominate.  On the right side, the dissection plane was kept as anterior as possible to avoid injury to the right phrenic nerve.  There was no invasion of the pericardium or any surrounding structures.  After the fat pad had been completely freed up it was removed through the utility incision, the mass was visible and appeared cystic in nature. It was sent for frozen section which revealed a complete resection of a cystic mass, probably of thymic origin.  An inspection was made for hemostasis.  A Blake drain was placed through the inferior port incision and directed into the left pleural space and across the mediastinum into the right pleural space which had been opened during the dissection, it was secured to skin with #1 silk suture.  The left lung was reinflated.  The utility incision was closed with #1 Vicryl fascial suture, 2-0 Vicryl subcutaneous suture and 3-0 Vicryl subcuticular suture.  Dermabond was applied.  The remaining small port incision in the third interspace was closed with a 3-0 Vicryl subcuticular suture and Dermabond was applied as well, the chest tube was placed to suction.  The patient was extubated in the operating room and taken to the postanesthetic care unit in good condition.     Revonda Standard Roxan Hockey, M.D.     SCH/MEDQ  D:  01/07/2015  T:  01/08/2015  Job:  [id number]

## 2015-01-26 DIAGNOSIS — R310 Gross hematuria: Secondary | ICD-10-CM | POA: Diagnosis not present

## 2015-02-02 DIAGNOSIS — C61 Malignant neoplasm of prostate: Secondary | ICD-10-CM | POA: Diagnosis not present

## 2015-02-03 ENCOUNTER — Encounter: Payer: Self-pay | Admitting: Cardiology

## 2015-02-03 ENCOUNTER — Ambulatory Visit (INDEPENDENT_AMBULATORY_CARE_PROVIDER_SITE_OTHER): Payer: Medicare Other | Admitting: Cardiology

## 2015-02-03 VITALS — BP 120/80 | HR 65 | Ht 70.0 in | Wt 193.1 lb

## 2015-02-03 DIAGNOSIS — I4891 Unspecified atrial fibrillation: Secondary | ICD-10-CM | POA: Diagnosis not present

## 2015-02-03 DIAGNOSIS — I480 Paroxysmal atrial fibrillation: Secondary | ICD-10-CM | POA: Diagnosis not present

## 2015-02-03 DIAGNOSIS — R0609 Other forms of dyspnea: Secondary | ICD-10-CM

## 2015-02-03 NOTE — Progress Notes (Signed)
Cardiology Office Note   Date:  02/03/2015   ID:  [name] [name], DOB Apr 30, 1941, MRN [id number]  PCP:  Kennon Portela, MD  Cardiologist: Darlin Coco MD  No chief complaint on file.     History of Present Illness: [name] [name] is a 74 y.o. male who presents for cardiology follow-up. 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 He was admitted 11/26/14 for video-assisted thoracoscopy with resection of an anterior mediastinal mass. On post op day #1, he went into AFib with RVR. The patient has a past history of atrial fibrillation. He had had 3 prior cardioversions and had been on long-term Coumadin. Eight years ago he saw Dr. Ola Spurr at [facility] and underwent an ablation of his atrial fibrillation. He had had an excellent response and he had no recurrent atrial fibrillation until recently. In the hospital, he was treated with IV Dilt for rate control. He was also placed on Amiodarone. His initial ECG was suggestive of pericarditis. He was treated with Ibuprofen x 1 week. He converted to NSR. CHADS2-VASc=1 (age > 37). His echo demonstrated normal LVF. His amiodarone was discontinued on 01/06/15 when he was seen by Dr. Roxan Hockey in follow-up.  He has been having fatigue and dyspnea.  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  His most recent chest x-ray showed slight improvement in the left phrenic nerve palsy.  He is discouraged about the slow pace of improvement however.  He gives speeches about orthodontics all over [place] and his voice at the present time is quite soft and he is unable to do the speaking he is accustomed to doing.     Past Medical History  Diagnosis Date  . Atrial fibrillation   . Colon polyps     hyperplastic and tubular adenomatous  . Thrombocytopenia   . Radiation     for prostate cancer  . Neuromuscular disorder   . Prostate  cancer   . Erectile dysfunction following radical prostatectomy   . Mediastinal mass     per CT CHEST 11/07/14 @ [facility].[name] KitchenANTERIOR...4.2 X 3.3 cm soft tissue  . Dysrhythmia     hx at fib- ablation 8 yrs ago    Past Surgical History  Procedure Laterality Date  . Knee arthroscopy      left  . Tonsillectomy    . Back surgery    . Robot assisted laparoscopic radical prostatectomy  2009    radiation Tx 2011  . Vasectomy    . Colonoscopy    . Polypectomy    . Atrial ablation surgery      for a fib x 5 years ago  . Flexible sigmoidoscopy  02/10/2012    Procedure: FLEXIBLE SIGMOIDOSCOPY;  Surgeon: Irene Shipper, MD;  Location: [facility];  Service: Endoscopy;  Laterality: N/A;  need APC   . Prostate surgery    . Video assisted thoracoscopy Left 11/26/2014    Procedure: VIDEO ASSISTED THORACOSCOPY;  Surgeon: Melrose Nakayama, MD;  Location: [place];  Service: Thoracic;  Laterality: Left;  . Resection of mediastinal mass N/A 11/26/2014    Procedure: RESECTION OF ANTERIOR MEDIASTINAL MASS;  Surgeon: Melrose Nakayama, MD;  Location: [place];  Service: Thoracic;  Laterality: N/A;  . Lead removal N/A 11/26/2014    Procedure: CRYO INTERCOSTAL NERVE BLOCK;  Surgeon: Melrose Nakayama, MD;  Location: [place];  Service: Thoracic;  Laterality: N/A;  . Cystoscopy N/A 11/26/2014    Procedure: CYSTOSCOPY FLEXIBLE;  Surgeon: Bjorn Loser, MD;  Location: [place];  Service: Urology;  Laterality: N/A;  . Cystoscopy with urethral dilatation N/A 11/26/2014    Procedure: CYSTOSCOPY WITH URETHRAL DILATATION WITH INSERTION OF FOLEY;  Surgeon: Bjorn Loser, MD;  Location: [place];  Service: Urology;  Laterality: N/A;     Current Outpatient Prescriptions  Medication Sig Dispense Refill  . ibuprofen (ADVIL,MOTRIN) 400 MG tablet Take 400 mg by mouth every 6 (six) hours as needed (for pain).     No current facility-administered medications for this visit.    Allergies:   Review of  patient's allergies indicates no known allergies.    Social History:  The patient  reports that he has never smoked. He has never used smokeless tobacco. He reports that he does not drink alcohol or use illicit drugs.   Family History:  The patient's family history includes Healthy in his sister and sister; Heart attack in his mother; Heart disease in his mother; Prostate cancer in his father. There is no history of Colon cancer.    ROS:  Please see the history of present illness.   Otherwise, review of systems are positive for none.   All other systems are reviewed and negative.    PHYSICAL EXAM: VS:  BP 120/80 mmHg  Pulse 65  Ht 5\' 10"  (1.778 m)  Wt 193 lb 1.9 oz (87.599 kg)  BMI 27.71 kg/m2 , BMI Body mass index is 27.71 kg/(m^2). GEN: Well nourished, well developed, in no acute distress HEENT: normal Neck: no JVD, carotid bruits, or masses Cardiac: RRR; no murmurs, rubs, or gallops,no edema  Respiratory:  clear to auscultation bilaterally, normal work of breathing GI: soft, nontender, nondistended, + BS MS: no deformity or atrophy Skin: warm and dry, no rash Neuro:  Strength and sensation are intact Psych: euthymic mood, full affect   EKG:  EKG is ordered today. The ekg ordered today demonstrates sinus rhythm.  Normal PR interval.  Nonspecific ST-T wave changes improved since 12/22/14   Recent Labs: 11/28/2014: ALT 15; BUN 15; Creatinine 0.84; Hemoglobin 12.5*; Platelets 163; Potassium 3.8; Sodium 135 01/06/2015: TSH 2.405    Lipid Panel    Component Value Date/Time   CHOL 197 08/15/2013 1026   TRIG 98 08/15/2013 1026   HDL 40 08/15/2013 1026   CHOLHDL 4.9 08/15/2013 1026   VLDL 20 08/15/2013 1026   LDLCALC 137* 08/15/2013 1026      Wt Readings from Last 3 Encounters:  02/03/15 193 lb 1.9 oz (87.599 kg)  01/06/15 193 lb (87.544 kg)  12/22/14 193 lb (87.544 kg)         ASSESSMENT AND PLAN:  1.  Paroxysmal atrial fibrillation, status post ablation 8 years  ago.  Maintaining normal sinus rhythm off amiodarone. CHADSSVASC score of 1 for age.  Not on anticoagulation   Current medicines are reviewed at length with the patient today.  The patient does not have concerns regarding medicines.  The following changes have been made:  We are stopping his beta blocker at this point.  The pressure is normal.  Prior to surgery he was on no cardiac medications.  Labs/ tests ordered today include:   Orders Placed This Encounter  Procedures  . EKG 12-Lead     Disposition: We will stop his beta blocker at this point.  He will keep tabs on his heart rate closely.  He will let us know if there is  any recurrence of atrial fibrillation.  We'll plan to see him in about 4 months for office visit and EKG.  Hopefully his phrenic nerve paralysis will gradually improve.  Berna Spare MD 02/03/2015 8:29 AM    [facility] [place], [place], [place]  [address] Phone: [phone]; Fax: [phone]

## 2015-02-03 NOTE — Patient Instructions (Addendum)
Medication Instructions:  STOP METOPROLOL   Labwork: NONE  Testing/Procedures: NONE  Follow-Up: Your physician recommends that you schedule a follow-up appointment in: 4 MONTH OV/EKG    Any Other Special Instructions Will Be Listed Below (If Applicable).

## 2015-02-05 ENCOUNTER — Other Ambulatory Visit (HOSPITAL_COMMUNITY): Payer: Self-pay | Admitting: Urology

## 2015-02-05 ENCOUNTER — Other Ambulatory Visit: Payer: Self-pay | Admitting: Thoracic Surgery (Cardiothoracic Vascular Surgery)

## 2015-02-05 DIAGNOSIS — C61 Malignant neoplasm of prostate: Secondary | ICD-10-CM

## 2015-02-05 DIAGNOSIS — I4891 Unspecified atrial fibrillation: Secondary | ICD-10-CM

## 2015-02-10 ENCOUNTER — Ambulatory Visit
Admission: RE | Admit: 2015-02-10 | Discharge: 2015-02-10 | Disposition: A | Discharge status: Home / Self Care | Payer: Medicare Other | Source: Ambulatory Visit | Attending: Thoracic Surgery (Cardiothoracic Vascular Surgery) | Admitting: Thoracic Surgery (Cardiothoracic Vascular Surgery)

## 2015-02-10 ENCOUNTER — Ambulatory Visit (INDEPENDENT_AMBULATORY_CARE_PROVIDER_SITE_OTHER): Payer: Self-pay | Admitting: Thoracic Surgery (Cardiothoracic Vascular Surgery)

## 2015-02-10 ENCOUNTER — Encounter: Payer: Self-pay | Admitting: Thoracic Surgery (Cardiothoracic Vascular Surgery)

## 2015-02-10 VITALS — BP 121/79 | HR 89 | Resp 16 | Ht 70.0 in | Wt 193.0 lb

## 2015-02-10 DIAGNOSIS — Z9889 Other specified postprocedural states: Secondary | ICD-10-CM

## 2015-02-10 DIAGNOSIS — I4891 Unspecified atrial fibrillation: Secondary | ICD-10-CM

## 2015-02-10 DIAGNOSIS — R05 Cough: Secondary | ICD-10-CM | POA: Diagnosis not present

## 2015-02-10 DIAGNOSIS — R0602 Shortness of breath: Secondary | ICD-10-CM | POA: Diagnosis not present

## 2015-02-10 DIAGNOSIS — Z9089 Acquired absence of other organs: Secondary | ICD-10-CM

## 2015-02-10 DIAGNOSIS — E328 Other diseases of thymus: Secondary | ICD-10-CM

## 2015-02-10 MED ORDER — METOPROLOL TARTRATE 25 MG PO TABS: 12.5000 mg | ORAL_TABLET | Freq: Two times a day (BID) | ORAL | Status: DC

## 2015-02-10 MED ORDER — BENZONATATE 200 MG PO CAPS: 200.0000 mg | ORAL_CAPSULE | Freq: Three times a day (TID) | ORAL | Status: DC | PRN

## 2015-02-10 NOTE — Progress Notes (Signed)
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  He is a 74 year old gentleman who was found to have a mediastinal mass on a CT scan as part of a workup for prostate cancer. I did a left thoracoscopic thymectomy on 11/26/2014. 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 Postoperatively he had atrial fibrillation, which finally resolved with amiodarone and metoprolol. He went home on day 4.  Post discharge he has had significant issues with shortness of breath and weak voice. He also complains of constant coughing spells, which sometimes are quite violent.   He was not having any issues related to atrial fibrillation post discharge. His amiodarone was discontinued. He saw Dr. Mare Ferrari last week and was in sinus rhythm. His metoprolol was discontinued due to the possibility it could be contributing to his general malaise.  He says that he has noticed multiple episodes of atrial fibrillation since the metoprolol was discontinued. He continues to complain of a weak voice and shortness of breath and general malaise.  Past Medical History  Diagnosis Date  . Atrial fibrillation   . Colon polyps     hyperplastic and tubular adenomatous  . Thrombocytopenia   . Radiation     for prostate cancer  . Neuromuscular disorder   . Prostate cancer   . Erectile dysfunction following radical prostatectomy   . Mediastinal mass     per CT CHEST 11/07/14 @ [facility].[name] KitchenANTERIOR...4.2 X 3.3 cm soft tissue  . Dysrhythmia     hx at fib- ablation 8 yrs ago     Current Outpatient Prescriptions  Medication Sig Dispense Refill  . ibuprofen (ADVIL,MOTRIN) 400 MG tablet Take 400 mg by mouth every 6 (six) hours as needed (for pain).    . benzonatate (TESSALON) 200 MG capsule Take 1 capsule (200 mg total) by mouth 3 (three) times daily as needed for cough. 40 capsule 1  .  metoprolol tartrate (LOPRESSOR) 25 MG tablet Take 0.5 tablets (12.5 mg total) by mouth 2 (two) times daily. 60 tablet 1   No current facility-administered medications for this visit.    Physical Exam BP 121/79 mmHg  Pulse 89  Resp 16  Ht 5\' 10"  (1.778 m)  Wt 193 lb (87.544 kg)  BMI 27.69 kg/m2  SpO78 82% 74 year old man in no acute distress Alert and oriented 3 with no focal deficits Diminished breath sounds at bases Incisions well healed Cardiac regular rate and rhythm, normal S1 and S2  Diagnostic Tests: CHEST 2 VIEW  COMPARISON: PA and lateral chest x-ray of Feb 05, 2015  FINDINGS: The lungs remain hypoinflated. There is no alveolar infiltrate. There is scarring at the lung bases on the lateral film. This is not clearly evident on the frontal film. The cardiac silhouette is normal in size. The pulmonary vascularity is not engorged. There are surgical clips which project to the left in the anterior aspect of the left upper hemithorax. There is multilevel degenerative disc disease of the thoracic spine.  IMPRESSION: Bilateral hypoinflation with stable bibasilar atelectasis or scarring. There is no CHF or pleural effusion.   Electronically Signed  By: David Martinique M.D.  On: 02/10/2015 10:39  Impression: Samwise is now almost 3 months out from a thoracoscopic thymectomy. He has elevation of the left hemidiaphragm  consistent with phrenic nerve dysfunction. This is very frustrating to him. At the present time is really nothing to do but continue to observe. In most cases phrenic nerve function and diaphragmatic movement will return although sometimes takes 6-9 months. If not there are treatment options including plication of the diaphragm and phrenic nerve interposition. He does not want to considering surgical options at this time, and it is not an appropriate time to do so.  Regarding his weak voice, I suspect it is related to his diaphragmatic dysfunction. He has  seen Dr. Ernesto Rutherford in the past so I'm going to refer him back to his make sure there is no vocal cord issue. The recurrent nerve is not in the operative field, so I don't think there is any issue related to that.  He is having minimal discomfort and is not requiring any narcotic pain medication.  He says that he is scheduled to begin a clinical trial for prostate cancer since his PSA is rising.   Plan:  Refer to Dr. Ernesto Rutherford for evaluation of vocal cords/ voice.  I will see him back in a month with a repeat chest x-ray to check on his progress   He is fine to start his trial.  Melrose Nakayama, MD [facility] [phone]

## 2015-02-12 DIAGNOSIS — J040 Acute laryngitis: Secondary | ICD-10-CM | POA: Diagnosis not present

## 2015-02-12 DIAGNOSIS — J9811 Atelectasis: Secondary | ICD-10-CM | POA: Diagnosis not present

## 2015-02-12 DIAGNOSIS — J322 Chronic ethmoidal sinusitis: Secondary | ICD-10-CM | POA: Diagnosis not present

## 2015-02-12 DIAGNOSIS — J320 Chronic maxillary sinusitis: Secondary | ICD-10-CM | POA: Diagnosis not present

## 2015-02-12 DIAGNOSIS — K760 Fatty (change of) liver, not elsewhere classified: Secondary | ICD-10-CM | POA: Diagnosis not present

## 2015-02-12 DIAGNOSIS — C61 Malignant neoplasm of prostate: Secondary | ICD-10-CM | POA: Diagnosis not present

## 2015-02-12 DIAGNOSIS — K8020 Calculus of gallbladder without cholecystitis without obstruction: Secondary | ICD-10-CM | POA: Diagnosis not present

## 2015-02-17 ENCOUNTER — Ambulatory Visit (HOSPITAL_COMMUNITY)
Admission: RE | Admit: 2015-02-17 | Discharge: 2015-02-17 | Disposition: A | Discharge status: Home / Self Care | Payer: Medicare Other | Source: Ambulatory Visit | Attending: Urology | Admitting: Urology

## 2015-02-17 ENCOUNTER — Encounter (HOSPITAL_COMMUNITY)
Admission: RE | Admit: 2015-02-17 | Discharge: 2015-02-17 | Disposition: A | Discharge status: Home / Self Care | Payer: Medicare Other | Source: Ambulatory Visit | Attending: Urology | Admitting: Urology

## 2015-02-17 DIAGNOSIS — C61 Malignant neoplasm of prostate: Secondary | ICD-10-CM | POA: Insufficient documentation

## 2015-02-17 MED ORDER — TECHNETIUM TC 99M MEDRONATE IV KIT
25.0000 | PACK | Freq: Once | INTRAVENOUS | Status: AC | PRN
  Administered 2015-02-17: 25 via INTRAVENOUS

## 2015-02-20 ENCOUNTER — Ambulatory Visit: Payer: Medicare Other

## 2015-02-20 DIAGNOSIS — Z006 Encounter for examination for normal comparison and control in clinical research program: Secondary | ICD-10-CM

## 2015-02-20 NOTE — Progress Notes (Signed)
Patient here for EKG with [facility]. Copy of EKG sent to billing. Patient has original.

## 2015-02-20 NOTE — Patient Instructions (Signed)
Patient here for EKG with [facility]. Copy of EKG sent to billing. Patient has original.

## 2015-02-25 ENCOUNTER — Encounter: Payer: Self-pay | Admitting: Radiation Oncology

## 2015-02-25 NOTE — Progress Notes (Signed)
[facility]  Patient at risk for gynecomastia.   Prostatectomy done 2007. Hx of radiation therapy for recurrent prostate cancer completed July 2011 by Dr. Tammi Klippel. PSA began to increase again in July 2012.    Reports shortness of breath, weak voice, and constant coughing spells related to phrenic nerve dysfunction, general malaise, erectile dysfunction following radical prostatectomy, urinary frequency and nocturia following bladder neck dilation  Works as an Clinical biochemist  [employer]

## 2015-02-26 ENCOUNTER — Ambulatory Visit
Admission: RE | Admit: 2015-02-26 | Discharge: 2015-02-26 | Disposition: A | Discharge status: Home / Self Care | Payer: Medicare Other | Source: Ambulatory Visit | Attending: Radiation Oncology | Admitting: Radiation Oncology

## 2015-02-26 ENCOUNTER — Encounter: Payer: Self-pay | Admitting: Radiation Oncology

## 2015-02-26 VITALS — BP 110/72 | HR 70 | Resp 16 | Ht 70.0 in | Wt 192.3 lb

## 2015-02-26 DIAGNOSIS — C61 Malignant neoplasm of prostate: Secondary | ICD-10-CM

## 2015-02-26 DIAGNOSIS — Z510 Encounter for antineoplastic radiation therapy: Secondary | ICD-10-CM | POA: Diagnosis not present

## 2015-02-26 DIAGNOSIS — Z923 Personal history of irradiation: Secondary | ICD-10-CM | POA: Diagnosis not present

## 2015-02-26 DIAGNOSIS — Z8546 Personal history of malignant neoplasm of prostate: Secondary | ICD-10-CM | POA: Insufficient documentation

## 2015-02-26 DIAGNOSIS — N62 Hypertrophy of breast: Secondary | ICD-10-CM

## 2015-02-26 DIAGNOSIS — Z9079 Acquired absence of other genital organ(s): Secondary | ICD-10-CM | POA: Diagnosis not present

## 2015-02-26 NOTE — Progress Notes (Signed)
  Radiation Oncology         [phone] ________________________________  Name: Quince Santana MRN: [id number]  Date: 02/26/2015  DOB: 05/04/1941  SIMULATION AND TREATMENT PLANNING NOTE    ICD-9-CM ICD-10-CM   1. Prostate cancer 185 C61     DIAGNOSIS:  74 y.o gentleman at high risk for gynecomastia related to his prostate cancer treatment.   NARRATIVE:  The patient was brought to the [place].  Identity was confirmed.  All relevant records and images related to the planned course of therapy were reviewed.  The patient freely provided informed written consent to proceed with treatment after reviewing the details related to the planned course of therapy. The consent form was witnessed and verified by the simulation staff.  Then, the patient was set-up in a stable reproducible  supine position for radiation therapy.  CT images were obtained.  Surface markings were placed.  The CT images were loaded into the planning software.  Then the target and avoidance structures were contoured.  Treatment planning then occurred.  The radiation prescription was entered and confirmed.  Then, I designed and supervised the construction of a total of zero medically necessary complex treatment devices.  I have requested : to dose symmetry calculation.    PLAN:  The patient will receive 10 Gy in 1 fraction to the right and left breast.  ________________________________  Sheral Apley. Tammi Klippel, M.D.

## 2015-02-26 NOTE — Progress Notes (Signed)
Radiation Oncology         [phone] ________________________________  Initial Outpatient Consultation  Name: [name] [name] MRN: [id number]  Date: 02/26/2015  DOB: 03/20/1941  YB:OFBPZ, Veneda Melter, MD  Raynelle Bring, MD   REFERRING PHYSICIAN: Raynelle Bring, MD  DIAGNOSIS: The encounter diagnosis was Prostate cancer.     ICD-9-CM ICD-10-CM   1. Prostate cancer 79 C61     HISTORY OF PRESENT ILLNESS::[name] [name] is a 74 y.o. male who is very nice gentlemn who has prostatectomy in January 2007. He had a positive margin. The initial PSA was 0.02. The PSA rose to 0.69. Accordingly, the patient underwent radiation tx from 02/02/10-04/02/10. Unfortunately., he experience biochemical failure requiring salvage hormone therapy. Most recently, he was enrolled on a clinical study, the embark trial. He has randomize to Enzalutamide only. He is at risk for painful gynecomastia and he may benefit from radiation tx.   PREVIOUS RADIATION THERAPY: YES  PAST MEDICAL HISTORY:  has a past medical history of Atrial fibrillation; Colon polyps; Thrombocytopenia; Radiation; Neuromuscular disorder; Prostate cancer; Erectile dysfunction following radical prostatectomy; Mediastinal mass; and Dysrhythmia.    PAST SURGICAL HISTORY: Past Surgical History  Procedure Laterality Date  . Knee arthroscopy      left  . Tonsillectomy    . Back surgery    . Robot assisted laparoscopic radical prostatectomy  2009    radiation Tx 2011  . Vasectomy    . Colonoscopy    . Polypectomy    . Atrial ablation surgery      for a fib x 5 years ago  . Flexible sigmoidoscopy  02/10/2012    Procedure: FLEXIBLE SIGMOIDOSCOPY;  Surgeon: Irene Shipper, MD;  Location: [facility];  Service: Endoscopy;  Laterality: N/A;  need APC   . Prostate surgery    . Video assisted thoracoscopy Left 11/26/2014    Procedure: VIDEO ASSISTED THORACOSCOPY;  Surgeon: Melrose Nakayama, MD;  Location: [place];  Service: Thoracic;  Laterality:  Left;  . Resection of mediastinal mass N/A 11/26/2014    Procedure: RESECTION OF ANTERIOR MEDIASTINAL MASS;  Surgeon: Melrose Nakayama, MD;  Location: [place];  Service: Thoracic;  Laterality: N/A;  . Lead removal N/A 11/26/2014    Procedure: CRYO INTERCOSTAL NERVE BLOCK;  Surgeon: Melrose Nakayama, MD;  Location: [place];  Service: Thoracic;  Laterality: N/A;  . Cystoscopy N/A 11/26/2014    Procedure: Erlene Quan;  Surgeon: Bjorn Loser, MD;  Location: [place];  Service: Urology;  Laterality: N/A;  . Cystoscopy with urethral dilatation N/A 11/26/2014    Procedure: CYSTOSCOPY WITH URETHRAL DILATATION WITH INSERTION OF FOLEY;  Surgeon: Bjorn Loser, MD;  Location: [place];  Service: Urology;  Laterality: N/A;    FAMILY HISTORY: family history includes Healthy in his sister and sister; Heart attack in his mother; Heart disease in his mother; Prostate cancer in his father. There is no history of Colon cancer.  SOCIAL HISTORY:  reports that he has never smoked. He has never used smokeless tobacco. He reports that he does not drink alcohol or use illicit drugs.  ALLERGIES: Review of patient's allergies indicates no known allergies.  MEDICATIONS:  Current Outpatient Prescriptions  Medication Sig Dispense Refill  . Calcium Carb-Cholecalciferol (CALCIUM PLUS VITAMIN D3) 600-800 MG-UNIT TABS Take by mouth.    [name] Kitchen ibuprofen (ADVIL,MOTRIN) 400 MG tablet Take 400 mg by mouth every 6 (six) hours as needed (for pain).    . metoprolol tartrate (LOPRESSOR) 25 MG tablet Take 0.5 tablets (12.5  mg total) by mouth 2 (two) times daily. 60 tablet 1  . mirabegron ER (MYRBETRIQ) 50 MG TB24 tablet Take 50 mg by mouth daily.    . benzonatate (TESSALON) 200 MG capsule Take 1 capsule (200 mg total) by mouth 3 (three) times daily as needed for cough. (Patient not taking: Reported on 02/26/2015) 40 capsule 1   No current facility-administered medications for this encounter.    REVIEW OF SYSTEMS:  A 15 point  review of systems is documented in the electronic medical record. This was obtained by the nursing staff. However, I reviewed this with the patient to discuss relevant findings and make appropriate changes.     PHYSICAL EXAM:  height is 5\' 10"  (1.778 m) and weight is 192 lb 4.8 oz (87.227 kg). His blood pressure is 110/72 and his pulse is 70. His respiration is 16 and oxygen saturation is 93%.   The patient has no notable gynecomastia at present.   KPS = 100  100 - Normal; no complaints; no evidence of disease. 90   - Able to carry on normal activity; minor signs or symptoms of disease. 80   - Normal activity with effort; some signs or symptoms of disease. 39   - Cares for self; unable to carry on normal activity or to do active work. 60   - Requires occasional assistance, but is able to care for most of his personal needs. 50   - Requires considerable assistance and frequent medical care. 27   - Disabled; requires special care and assistance. 62   - Severely disabled; hospital admission is indicated although death not imminent. 66   - Very sick; hospital admission necessary; active supportive treatment necessary. 10   - Moribund; fatal processes progressing rapidly. 0     - Dead  Karnofsky DA, Abelmann [place], Craver LS and [place] J[facility] [phone]) The use of the nitrogen mustards in the palliative treatment of carcinoma: with particular reference to bronchogenic carcinoma Cancer 1 634-56  LABORATORY DATA:  Lab Results  Component Value Date   WBC 11.3* 11/28/2014   HGB 12.5* 11/28/2014   HCT 38.0* 11/28/2014   MCV 91.3 11/28/2014   PLT 163 11/28/2014   Lab Results  Component Value Date   NA 135 11/28/2014   K 3.8 11/28/2014   CL 100 11/28/2014   CO2 29 11/28/2014   Lab Results  Component Value Date   ALT 15 11/28/2014   AST 20 11/28/2014   ALKPHOS 57 11/28/2014   BILITOT 1.5* 11/28/2014     RADIOGRAPHY: Dg Chest 2 View  02/10/2015   CLINICAL DATA:  Atrial fibrillation, shortness  of breath and nonproductive cough ; history of prostate malignancy ; mediastinal mass removed in March of 2016  EXAM: CHEST  2 VIEW  COMPARISON:  PA and lateral chest x-ray of Feb 05, 2015  FINDINGS: The lungs remain hypoinflated. There is no alveolar infiltrate. There is scarring at the lung bases on the lateral film. This is not clearly evident on the frontal film. The cardiac silhouette is normal in size. The pulmonary vascularity is not engorged. There are surgical clips which project to the left in the anterior aspect of the left upper hemithorax. There is multilevel degenerative disc disease of the thoracic spine.  IMPRESSION: Bilateral hypoinflation with stable bibasilar atelectasis or scarring. There is no CHF or pleural effusion.   Electronically Signed   By: David  Martinique M.D.   On: 02/10/2015 10:39   Nm Bone Scan Whole Body  02/18/2015   CLINICAL DATA:  Prostate cancer  RESEARCH PATIENT AUSEMBARK MDV 3100-13  EXAM: NUCLEAR MEDICINE WHOLE BODY BONE SCAN  TECHNIQUE: Whole body anterior and posterior images were obtained approximately 3 hours after intravenous injection of radiopharmaceutical.  RADIOPHARMACEUTICALS:  25.0 Technetium-1m MDP IV  COMPARISON:  Bone scan 11/07/2014  PCWG2 Measurements for Bone Metastasis:  No skeletal metastasis.  FINDINGS: No abnormal radiotracer uptake in the axillary or appendicular skeleton to suggest osseous metastasis. Degenerate uptake noted in acromioclavicular joints, sternomanubrial joints, knees and feet. Small amount activity in the bladder. Degenerative uptake noted in the lumbar spine consistent with osteophytosis.  IMPRESSION: 1. No interval change. 2. No evidence of skeletal metastasis   Electronically Signed   By: Suzy Bouchard M.D.   On: 02/18/2015 13:51      IMPRESSION: This is a very nice gentlemen who is at risk for gynecomastia and may benefit from radiation tx.  PLAN: Today, I talked to the patient and family about the findings and work-up thus  far.  We discussed the natural history of hormone related gynecomastia and general treatment, highlighting the role of radiotherapy in the management.  We discussed the available radiation techniques, and focused on the details of logistics and delivery.  We reviewed the anticipated acute and late sequelae associated with radiation in this setting.  The patient was encouraged to ask questions that I answered to the best of my ability.  I filled out a patient counseling form during our discussion including treatment diagrams.  We retained a copy for our records.  The patient would like to proceed with radiation and will be scheduled for CT simulation.  I spent 60 minutes minutes face to face with the patient and more than 50% of that time was spent in counseling and/or coordination of care.  This document serves as a record of services personally performed by Tyler Pita, MD. It was created on his behalf by Jeralene Peters, a trained medical scribe. The creation of this record is based on the scribe's personal observations and the provider's statements to them. This document has been checked and approved by the attending provider.       ------------------------------------------------  Sheral Apley Tammi Klippel, M.D.

## 2015-02-26 NOTE — Progress Notes (Signed)
See progress note under physician encounter. 

## 2015-02-27 ENCOUNTER — Encounter: Payer: Self-pay | Admitting: Radiation Oncology

## 2015-02-27 ENCOUNTER — Ambulatory Visit
Admission: RE | Admit: 2015-02-27 | Discharge: 2015-02-27 | Disposition: A | Discharge status: Home / Self Care | Payer: Medicare Other | Source: Ambulatory Visit | Attending: Radiation Oncology | Admitting: Radiation Oncology

## 2015-02-27 ENCOUNTER — Other Ambulatory Visit (HOSPITAL_COMMUNITY): Payer: Self-pay | Admitting: Urology

## 2015-02-27 DIAGNOSIS — C61 Malignant neoplasm of prostate: Secondary | ICD-10-CM

## 2015-02-27 DIAGNOSIS — Z9079 Acquired absence of other genital organ(s): Secondary | ICD-10-CM | POA: Diagnosis not present

## 2015-02-27 DIAGNOSIS — Z923 Personal history of irradiation: Secondary | ICD-10-CM | POA: Diagnosis not present

## 2015-02-27 DIAGNOSIS — Z8546 Personal history of malignant neoplasm of prostate: Secondary | ICD-10-CM | POA: Diagnosis not present

## 2015-02-27 DIAGNOSIS — Z510 Encounter for antineoplastic radiation therapy: Secondary | ICD-10-CM | POA: Diagnosis not present

## 2015-02-27 NOTE — Progress Notes (Signed)
  Radiation Oncology         [phone]   Name: [name] [name] MRN: [id number]   Date: 02/27/2015  DOB: 09-19-1940   Weekly Radiation Therapy Management    ICD-9-CM ICD-10-CM   1. Prostate cancer 185 C61     Current Dose: 10 Gy  Planned Dose:  10 Gy  Narrative The patient presents for routine under treatment assessment. A single dose of radiation to bilateral breast buds to prevent gynecomastia..  The patient is without complaint. Set-up was reviewed. The chart was checked.  Physical Findings  examination of the breasts reveals no palpable glandular tissue. Patient is a normal hair pattern on the chest with no skin changes.  Impression The patient is tolerated radiation.  Plan  patient's course of therapy is complete after today's treatment and he will follow-up in radiation clinic on as-needed basis.          Sheral Apley Tammi Klippel, M.D.

## 2015-03-02 DIAGNOSIS — J3800 Paralysis of vocal cords and larynx, unspecified: Secondary | ICD-10-CM | POA: Diagnosis not present

## 2015-03-09 NOTE — Progress Notes (Signed)
  Radiation Oncology         [phone]) [phone] ________________________________  Name: [name] [name] MRN: [id number]  Date: 02/27/2015  DOB: 1941/07/06  End of Treatment Note    ICD-9-CM ICD-10-CM   1. Prostate cancer 185 C61     DIAGNOSIS: 74 y.o gentleman at high risk for gynecomastia related to his prostate cancer treatment.      Indication for treatment:  Prevention of gynecomastia       Radiation treatment dates:   02/27/2015  Site/dose:   The right and left breasts were treated to 20 Gy in one fraction  Beams/energy:   En Face 9 MeV electron fields were treated to the depth of 90% dose  Narrative: The patient tolerated radiation treatment relatively well.     Plan: The patient has completed radiation treatment. The patient will return to radiation oncology clinic for routine followup in one month. I advised him to call or return sooner if he has any questions or concerns related to his recovery or treatment. ________________________________  Sheral Apley. Tammi Klippel, M.D.

## 2015-03-23 ENCOUNTER — Other Ambulatory Visit: Payer: Self-pay | Admitting: Thoracic Surgery (Cardiothoracic Vascular Surgery)

## 2015-03-23 DIAGNOSIS — E328 Other diseases of thymus: Secondary | ICD-10-CM

## 2015-03-24 ENCOUNTER — Encounter: Payer: Self-pay | Admitting: Thoracic Surgery (Cardiothoracic Vascular Surgery)

## 2015-03-24 ENCOUNTER — Ambulatory Visit
Admission: RE | Admit: 2015-03-24 | Discharge: 2015-03-24 | Disposition: A | Discharge status: Home / Self Care | Payer: Medicare Other | Source: Ambulatory Visit | Attending: Thoracic Surgery (Cardiothoracic Vascular Surgery) | Admitting: Thoracic Surgery (Cardiothoracic Vascular Surgery)

## 2015-03-24 ENCOUNTER — Ambulatory Visit (INDEPENDENT_AMBULATORY_CARE_PROVIDER_SITE_OTHER): Payer: Medicare Other | Admitting: Thoracic Surgery (Cardiothoracic Vascular Surgery)

## 2015-03-24 VITALS — BP 110/77 | HR 78 | Resp 20 | Ht 70.0 in | Wt 192.0 lb

## 2015-03-24 DIAGNOSIS — G588 Other specified mononeuropathies: Secondary | ICD-10-CM

## 2015-03-24 DIAGNOSIS — G5682 Other specified mononeuropathies of left upper limb: Secondary | ICD-10-CM | POA: Diagnosis not present

## 2015-03-24 DIAGNOSIS — Z9889 Other specified postprocedural states: Secondary | ICD-10-CM | POA: Diagnosis not present

## 2015-03-24 DIAGNOSIS — E328 Other diseases of thymus: Secondary | ICD-10-CM

## 2015-03-24 DIAGNOSIS — R491 Aphonia: Secondary | ICD-10-CM

## 2015-03-24 DIAGNOSIS — R0602 Shortness of breath: Secondary | ICD-10-CM | POA: Diagnosis not present

## 2015-03-24 DIAGNOSIS — Z9089 Acquired absence of other organs: Secondary | ICD-10-CM

## 2015-03-24 DIAGNOSIS — J986 Disorders of diaphragm: Secondary | ICD-10-CM | POA: Diagnosis not present

## 2015-03-24 DIAGNOSIS — J3800 Paralysis of vocal cords and larynx, unspecified: Secondary | ICD-10-CM

## 2015-03-24 NOTE — Progress Notes (Signed)
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  He is a 74 year old gentleman who underwent a thoracoscopic thymectomy back on March 16. His early postoperative course was uncomplicated. However he has had a difficult time recovering from surgery. 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 He also has left vocal cord paralysis. He is seeing Dr. Ernesto [name] for that.  He was last seen in the office about a month ago. In the past month things have not changed significantly. He still has difficulty talking. His voice start out okay but if he tries to talk prolonged period of time or tries to talk loudly he loses his voice and feels short of breath. He also is short of breath with mild exertion. This is improved slightly over time, but he is not able to resume his normal preoperative activity.  He has 2 new complaints. He complains of constipation and also a metallic taste of food. Both of these have started within the past 4-6 weeks. He is on a investigational study drug through [facility].  Past Medical History  Diagnosis Date  . Atrial fibrillation   . Colon polyps     hyperplastic and tubular adenomatous  . Thrombocytopenia   . Radiation     for prostate cancer  . Neuromuscular disorder   . Prostate cancer   . Erectile dysfunction following radical prostatectomy   . Mediastinal mass     per CT CHEST 11/07/14 @ [facility].[name] KitchenANTERIOR...4.2 X 3.3 cm soft tissue  . Dysrhythmia     hx at fib- ablation 8 yrs ago      Current Outpatient Prescriptions  Medication Sig Dispense Refill  . Calcium Carb-Cholecalciferol (CALCIUM PLUS VITAMIN D3) 600-800 MG-UNIT TABS Take by mouth.    [name] Kitchen ibuprofen (ADVIL,MOTRIN) 400 MG tablet Take 400 mg by mouth every 6 (six) hours as needed (for pain).    . Investigational -  Study Medication Take 4 tablets by mouth 1 day or 1 dose. Study program for [facility]    . metoprolol tartrate (LOPRESSOR) 25 MG tablet Take 0.5 tablets (12.5 mg total) by mouth 2 (two) times daily. 60 tablet 1  . mirabegron ER (MYRBETRIQ) 50 MG TB24 tablet Take 50 mg by mouth daily.     No current facility-administered medications for this visit.    Physical Exam BP 110/77 mmHg  Pulse 78  Resp 20  Ht 5\' 10"  (1.778 m)  Wt 192 lb (87.091 kg)  BMI 27.55 kg/m2  SpO63 49% 74 year old man in no acute distress Alert and oriented 3 Hoarse voice  Diagnostic Tests:  Chest x-ray was reviewed. It shows essentially no change in the elevation of left hemidiaphragm.  Impression: [name] [name] is a 74 year old man who is now 4 months out from a thoracoscopic thymectomy complicated by phrenic and recurrent nerve dysfunction. This has caused a great deal of symptoms and he is understandably distressed by them. I once again discussed the likely etiology of this wound. Again discussed 6 month waiting area to allow recovery of the nerves before intervening.  I strongly suspect his constipation and the metallic taste are due to the study drug. He is going to check with [facility] on that.  Plan:  I will plan  to see him back in a month and get another chest x-ray to check on his progress.  [name] Nakayama, MD [facility] [phone]

## 2015-03-25 DIAGNOSIS — G588 Other specified mononeuropathies: Secondary | ICD-10-CM | POA: Insufficient documentation

## 2015-03-25 DIAGNOSIS — J3800 Paralysis of vocal cords and larynx, unspecified: Secondary | ICD-10-CM | POA: Insufficient documentation

## 2015-03-31 ENCOUNTER — Telehealth: Payer: Self-pay | Admitting: Radiation Oncology

## 2015-03-31 NOTE — Telephone Encounter (Signed)
EMBARK TRIAL PATIENT. Approximately, one month has past since radiation treatment. Phoned patient to inquire about status. Patient reports only redness around the nipple area following treatment. Denies any other complaints. Reports he continues to follow up with his urologist. Denies any needs at this time. Requested this RN thank Dr. Tammi Klippel for "the great care he and his staff provided."

## 2015-04-06 ENCOUNTER — Other Ambulatory Visit: Payer: Self-pay

## 2015-04-06 MED ORDER — METOPROLOL TARTRATE 25 MG PO TABS: 12.5000 mg | ORAL_TABLET | Freq: Two times a day (BID) | ORAL | Status: DC

## 2015-04-16 DIAGNOSIS — D4989 Neoplasm of unspecified behavior of other specified sites: Secondary | ICD-10-CM | POA: Insufficient documentation

## 2015-04-27 DIAGNOSIS — J986 Disorders of diaphragm: Secondary | ICD-10-CM | POA: Diagnosis not present

## 2015-04-27 DIAGNOSIS — D150 Benign neoplasm of thymus: Secondary | ICD-10-CM | POA: Diagnosis not present

## 2015-04-27 DIAGNOSIS — R0602 Shortness of breath: Secondary | ICD-10-CM | POA: Diagnosis not present

## 2015-04-30 ENCOUNTER — Institutional Professional Consult (permissible substitution): Payer: Medicare Other | Admitting: Pulmonary Disease

## 2015-05-01 ENCOUNTER — Encounter: Payer: Self-pay | Admitting: Pulmonary Disease

## 2015-05-01 ENCOUNTER — Ambulatory Visit (INDEPENDENT_AMBULATORY_CARE_PROVIDER_SITE_OTHER): Payer: Medicare Other | Admitting: Pulmonary Disease

## 2015-05-01 VITALS — BP 124/68 | HR 57 | Ht 69.5 in | Wt 181.0 lb

## 2015-05-01 DIAGNOSIS — J9612 Chronic respiratory failure with hypercapnia: Secondary | ICD-10-CM | POA: Insufficient documentation

## 2015-05-01 HISTORY — DX: Chronic respiratory failure with hypercapnia: J96.12

## 2015-05-01 NOTE — Progress Notes (Signed)
Subjective:    Patient ID: [name] [name], male    DOB: 1941-08-07, 74 y.o.   MRN: [id number]  HPI Chief Complaint  Patient presents with  . Advice Only    referred by Dr. Elder [name] for increased SOB.  Pt had thymoma removed 5 months ago.     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 He is also suffered from prostate cancer and his currently enrolled in a clinical trial because of a rising PSA.  He tells me that the last several months of been miserable with significant fatigue and shortness of breath on exertion. He said that prior to his surgery he was able to walk 18 holes of golf without difficulty and he exercises regularly. However, he says now he can only walk a few feet without getting significantly short of breath. He says he's been trying to duty can keep his muscle strong but the chemotherapy have been contributing to his weakness.  He said prior to surgery he never smoked and he never had a baseline lung disease. He does not cough. He has not had a respiratory infection in the last several months.  He was evaluated by [facility] earlier this week for consideration of diaphragmatic plication. They want him to participate in pulmonary rehabilitation which sounds like is the primary reason for his referral here.   Past Medical History  Diagnosis Date  . Atrial fibrillation   . Colon polyps     hyperplastic and tubular adenomatous  . Thrombocytopenia   . Radiation     for prostate cancer  . Neuromuscular disorder   . Prostate cancer   . Erectile dysfunction following radical prostatectomy   . Mediastinal mass     per CT CHEST 11/07/14 @ [facility].[name] KitchenANTERIOR...4.2 X 3.3 cm soft tissue  . Dysrhythmia     hx at fib-  ablation 8 yrs ago     Family History  Problem Relation Age of Onset  . Prostate cancer Father   . Colon cancer Neg Hx   . Heart disease Mother   . Heart attack Mother   . Healthy Sister   . Healthy Sister      Social History   Social History  . Marital Status: Married    Spouse Name: N/A  . Number of Children: 4  . Years of Education: N/A   Occupational History  . orhodontist    Social History Main Topics  . Smoking status: Never Smoker   . Smokeless tobacco: Never Used  . Alcohol Use: No  . Drug Use: No  . Sexual Activity: No   Other Topics Concern  . Not on file   Social History Narrative     No Known Allergies   Outpatient Prescriptions Prior to Visit  Medication Sig Dispense Refill  . Calcium Carb-Cholecalciferol (CALCIUM PLUS VITAMIN D3) 600-800 MG-UNIT TABS Take by mouth.    [name] Kitchen ibuprofen (ADVIL,MOTRIN) 400 MG tablet Take 400 mg by mouth every 6 (six) hours as needed (for pain).    . Investigational - Study Medication Take 4 tablets by mouth 1 day or 1 dose. Study program for [facility]    . metoprolol tartrate (LOPRESSOR) 25 MG tablet Take 0.5 tablets (12.5 mg total) by mouth 2 (two) times daily.  60 tablet 1  . mirabegron ER (MYRBETRIQ) 50 MG TB24 tablet Take 50 mg by mouth daily.     No facility-administered medications prior to visit.       Review of Systems  Constitutional: Negative for fever and unexpected weight change.  HENT: Positive for rhinorrhea. Negative for congestion, dental problem, ear pain, nosebleeds, postnasal drip, sinus pressure, sneezing, sore throat and trouble swallowing.   Eyes: Negative for redness and itching.  Respiratory: Positive for shortness of breath. Negative for cough, chest tightness and wheezing.   Cardiovascular: Negative for palpitations and leg swelling.  Gastrointestinal: Negative for nausea and vomiting.  Genitourinary: Negative for dysuria.  Musculoskeletal: Negative for joint swelling.   Skin: Negative for rash.  Neurological: Negative for headaches.  Hematological: Does not bruise/bleed easily.  Psychiatric/Behavioral: Negative for dysphoric mood. The patient is not nervous/anxious.        Objective:   Physical Exam Filed Vitals:   05/01/15 1637  BP: 124/68  Pulse: 57  Height: 5' 9.5" (1.765 m)  Weight: 181 lb (82.101 kg)  SpO2: 97%   RA  Gen: well appearing, no acute distress HENT: NCAT, OP clear, neck supple without masses Eyes: PERRL, EOMi Lymph: no cervical lymphadenopathy PULM: CTA B, diminished left base, diaphragmatic excursion limited on left CV: RRR, no mgr, no JVD GI: BS+, soft, nontender, no hsm Derm: no rash or skin breakdown MSK: normal bulk and tone Neuro: A&Ox4, CN II-XII intact, strength 5/5 in all 4 extremities Psyche: normal mood and affect  CT chest images personally reviewed showing pulmonary atelectasis on the left with an elevated hemidiaphragm Clinic notes from [name] [name] were reviewed where she recommended that he have pulmonary rehabilitation and reconsideration for diaphragmatic plication     Assessment & Plan:  Chronic respiratory failure with hypercapnia [name] [name] had an unfortunate phrenic nerve injury after a surgery this year and has been left with left-sided diaphragm paralysis. He has been quite short of breath and suffered with some hoarseness since then. He is had significant lack of energy and apparently he was referred to [place] today for a pulmonary rehabilitation referral. I'm happy to oblige considering the fact that he has chronic hypercapnic respiratory failure.  I explained to him that I thought it would be reasonable for him to have an ABG to look for chronic hypercapnia and to consider nocturnal BiPAP therapy as I have had a number patients with unilateral diaphragm paralysis and chronic CO2 retention benefit from BIPAP.  After a explaining this to him in clinic he he initially agreed to this but apparently he is now  refusing a referral for this from our office staff.  Plan: Pulmonary rehabilitation referral follow-up with [place] when necessary If he changes his mind about an ABG and BIPAP titration I would be happy to arrange this     Current outpatient prescriptions:  .  Calcium Carb-Cholecalciferol (CALCIUM PLUS VITAMIN D3) 600-800 MG-UNIT TABS, Take by mouth., Disp: , Rfl:  .  ibuprofen (ADVIL,MOTRIN) 400 MG tablet, Take 400 mg by mouth every 6 (six) hours as needed (for pain)., Disp: , Rfl:  .  Investigational - Study Medication, Take 4 tablets by mouth 1 day or 1 dose. Study program for [facility], Disp: , Rfl:  .  metoprolol tartrate (LOPRESSOR) 25 MG tablet, Take 0.5 tablets (12.5 mg total) by mouth 2 (two) times daily., Disp: 60 tablet, Rfl: 1

## 2015-05-01 NOTE — Assessment & Plan Note (Addendum)
Mr. Glace had an unfortunate phrenic nerve injury after a surgery this year and has been left with left-sided diaphragm paralysis. He has been quite short of breath and suffered with some hoarseness since then. He is had significant lack of energy and apparently he was referred to [place] today for a pulmonary rehabilitation referral. I'm happy to oblige considering the fact that he has chronic hypercapnic respiratory failure.  I explained to him that I thought it would be reasonable for him to have an ABG to look for chronic hypercapnia and to consider nocturnal BiPAP therapy as I have had a number patients with unilateral diaphragm paralysis and chronic CO2 retention benefit from BIPAP.  After a explaining this to him in clinic he he initially agreed, but apparently he is now refusing a referral for this from our office staff.  Plan: Pulmonary rehabilitation referral Follow-up with [place] when necessary Keep follow-up with [facility] If he changes his mind about an ABG and BIPAP titration I would be happy to arrange this

## 2015-05-01 NOTE — Patient Instructions (Signed)
We will arrange a blood gas test to look to see if you are retaining carbon dioxide We will refer you to pulmonary rehabilitation We will arrange a sleep study with BiPAP titration We will see you back in 6 weeks or sooner if needed

## 2015-05-04 DIAGNOSIS — J3801 Paralysis of vocal cords and larynx, unilateral: Secondary | ICD-10-CM | POA: Diagnosis not present

## 2015-05-05 ENCOUNTER — Telehealth: Payer: Self-pay | Admitting: Pulmonary Disease

## 2015-05-05 NOTE — Telephone Encounter (Signed)
lmtcb

## 2015-05-06 NOTE — Telephone Encounter (Signed)
Completed pulm rehab form faxed yesterday. Usually takes 6-8 weeks to get an appt. Pulmonary Rehab will call pt.  If pt wants to follow up they can contact Pulm Rehab at [phone].

## 2015-05-06 NOTE — Telephone Encounter (Signed)
Spoke with pt and advised of pulmonary rehab referral.  Pt verbalized understanding.

## 2015-05-06 NOTE — Telephone Encounter (Signed)
Patient calling regarding rehab  Order entered on 05/01/15, patient has not received an appointment  [facility] - can you check on this?

## 2015-05-11 ENCOUNTER — Other Ambulatory Visit: Payer: Self-pay | Admitting: Thoracic Surgery (Cardiothoracic Vascular Surgery)

## 2015-05-11 ENCOUNTER — Telehealth (HOSPITAL_COMMUNITY): Payer: Self-pay

## 2015-05-11 DIAGNOSIS — E328 Other diseases of thymus: Secondary | ICD-10-CM

## 2015-05-11 NOTE — Telephone Encounter (Signed)
I have called and left a message with [facility] to inquire about participation in Pulmonary Rehab per Dr. Anastasia Pall referral. Will follow up.

## 2015-05-12 ENCOUNTER — Ambulatory Visit
Admission: RE | Admit: 2015-05-12 | Discharge: 2015-05-12 | Disposition: A | Discharge status: Home / Self Care | Payer: Medicare Other | Source: Ambulatory Visit | Attending: Thoracic Surgery (Cardiothoracic Vascular Surgery) | Admitting: Thoracic Surgery (Cardiothoracic Vascular Surgery)

## 2015-05-12 ENCOUNTER — Ambulatory Visit (INDEPENDENT_AMBULATORY_CARE_PROVIDER_SITE_OTHER): Payer: Medicare Other | Admitting: Thoracic Surgery (Cardiothoracic Vascular Surgery)

## 2015-05-12 ENCOUNTER — Encounter: Payer: Self-pay | Admitting: Thoracic Surgery (Cardiothoracic Vascular Surgery)

## 2015-05-12 VITALS — BP 121/80 | HR 70 | Resp 16 | Ht 69.5 in | Wt 177.0 lb

## 2015-05-12 DIAGNOSIS — Z9889 Other specified postprocedural states: Secondary | ICD-10-CM | POA: Diagnosis not present

## 2015-05-12 DIAGNOSIS — E328 Other diseases of thymus: Secondary | ICD-10-CM

## 2015-05-12 DIAGNOSIS — Z9089 Acquired absence of other organs: Secondary | ICD-10-CM

## 2015-05-12 DIAGNOSIS — R0602 Shortness of breath: Secondary | ICD-10-CM | POA: Diagnosis not present

## 2015-05-12 NOTE — Progress Notes (Signed)
[address]       [place] [address]             [phone]       HPI:  [name] [name] returns today for a scheduled follow-up visit.  In the interim since his last visit he has seen Dr. Williemae Natter at [facility] and Dr. Lake Bells here in [place].  He has noticed significant improvement in his hoarseness. He can only talk for relatively short periods of time and in short sentences or his voice starts to weaken.   He continues to get short of breath with exertion. He is walking 3 miles 3 times a week, but has to walk slowly. He can now get his incentive spirometer up to 1700, previously he was only going to about 900.  He has pain in the epigastrium that radiates through to his back. He can reproduce this pain by pushing on his upper epigastric area. He also complains of an abnormal sensation when he curls his toes. He does not have any numbness or pain in his hands or feet other than that.  Past Medical History  Diagnosis Date  . Atrial fibrillation   . Colon polyps     hyperplastic and tubular adenomatous  . Thrombocytopenia   . Radiation     for prostate cancer  . Neuromuscular disorder   . Prostate cancer   . Erectile dysfunction following radical prostatectomy   . Mediastinal mass     per CT CHEST 11/07/14 @ [name].[name] KitchenANTERIOR...4.2 X 3.3 cm soft tissue  . Dysrhythmia     hx at fib- ablation 8 yrs ago      Current Outpatient Prescriptions  Medication Sig Dispense Refill  . Calcium Carb-Cholecalciferol (CALCIUM PLUS VITAMIN D3) 600-800 MG-UNIT TABS Take by mouth.    [name] Kitchen ibuprofen (ADVIL,MOTRIN) 400 MG tablet Take 400 mg by mouth every 6 (six) hours as needed (for pain).    . Investigational - Study Medication Take 4 tablets by mouth 1 day or 1 dose. Study program for [name] [name]    . metoprolol tartrate (LOPRESSOR) 25 MG tablet Take 0.5 tablets (12.5 mg total) by mouth 2 (two) times daily. 60 tablet 1   No current  facility-administered medications for this visit.    Physical Exam BP 121/80 mmHg  Pulse 70  Resp 16  Ht 5' 9.5" (1.765 m)  Wt 177 lb (80.287 kg)  BMI 25.77 kg/m2  SpO16 2% 74 year old man in no acute distress Voice stronger initially, but weakens with time Diminished breath sounds at left base Cardiac regular rate and rhythm Incisions well healed 2+ pulses bilateral dorsalis pedis  Diagnostic Tests: I reviewed his chest x-ray. It shows persistent elevation of left hemidiaphragm.  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 He has had a great deal of difficulty with both of these issues. Complicating these issues since treatment with an investigational study drug for prostate cancer.  Shortness of breath- secondary to phrenic nerve injury / diaphragmatic dysfunction- his chest x-ray is unchanged. He is able to get the incentive spirometer to 1700 now about twice what he was doing previously. I suspect this is due to conditioning of his accessory muscles of respiration.  He does not get nearly a short of breath with speaking as he was previously, but this is still a problem compared to his preoperative state. He is able to walk 3 miles although he has to do so very slowly. That is a definite improvement. Based on the x-ray I do think this is most likely due to accessory muscle conditioning rather than recovery of the diaphragm. He is scheduled to have a sniff test in about 2 weeks. Think this will be confirmatory, I do not expect it to provide any significant new information. Although if it did show some limited motion it could be a indicator of potential recovery.  I think ultimately he will need a plication  of the left hemidiaphragm. He talked to Dr. Williemae Natter about that she felt that he needed to wait until after he completed his chemotherapy, which would not be until next February.  Hoarseness- recurrent nerve dysfunction. This is rather dramatically improved over the past month since his last visit. His wife says it happened over a period of about 48 hours. He does still get progressively worse the more he talks. He has an appointment with an ENT at [facility] in the next couple weeks. It'll be interesting to see what the findings are at that visit.  I don't have a good explanation for his epigastric discomfort that radiates through to his back. It does not sound consistent with angina. It is reproducible with palpation.  He was concerned about a possible circulatory problem causing the unusual sensation he experiences when he curls his toes. He has good pulses and no swelling so don't think there is a vascular issue. I wonder if this could be an early manifestation of neuropathy due to the investigational chemotherapy drug.  Dr. Lake Bells talked him about beginning pulmonary rehabilitation and he will start that next week. He is reluctant to have a sleep test as he now is sleeping through the night, which is also an improvement from his previous visits. I encouraged him to consider that. I think he might even benefit during the day if he had CPAP at night reexpanding his left lower lobe with positive pressure. He is concerned he will be able to sleep with the mask on. For now he will begin pulmonary rehabilitation and will continue to think about the sleep study.  Plan: He sees Dr. Lake Bells in a month.  I'll plan to see him back in 2 months and check a PA and lateral chest x-ray.  Melrose Nakayama, MD [facility] [phone]

## 2015-05-15 ENCOUNTER — Encounter (HOSPITAL_COMMUNITY): Payer: Self-pay

## 2015-05-15 ENCOUNTER — Encounter (HOSPITAL_COMMUNITY)
Admission: RE | Admit: 2015-05-15 | Discharge: 2015-05-15 | Disposition: A | Discharge status: Home / Self Care | Payer: Medicare Other | Source: Ambulatory Visit | Attending: Pulmonary Disease | Admitting: Pulmonary Disease

## 2015-05-15 VITALS — BP 124/80 | HR 80 | Resp 18 | Ht 70.0 in | Wt 181.2 lb

## 2015-05-15 DIAGNOSIS — J9612 Chronic respiratory failure with hypercapnia: Secondary | ICD-10-CM | POA: Diagnosis not present

## 2015-05-15 NOTE — Progress Notes (Signed)
[name] [name] 74 y.o. male Pulmonary Rehab Orientation Note Patient arrived today in Cardiac and Pulmonary Rehab for orientation to Pulmonary Rehab. He ambulated from [employer]. He has not been prescribed oxygen for home use. Color good, skin warm and dry. Patient is oriented to time and place. No psychosocial barriers to participation identified at this time. Patient's medical history and medications reviewed. Heart rate is normal, S1S2 present. Breath sounds clear to auscultation, no wheezes, rales, or rhonchi. Diminished in the left lower lobe. Grip strength equal, strong. Distal pulses palpable. No edema noted. Patient reports he does take medications as prescribed. Patient states he follows a Regular diet. He reports no specific efforts to gain or loose weight, however he states he has lost approximately 20 pounds since his thoracic surgery. He complains of constant burning and discomfort in his epigastric area that is accompanied by nausea and is reproducible. Patient encouraged to discuss this with primary care. Also discussed with patient side effects of NSAIDS if taken on an empty stomach. Patient takes NSAIDS at night for restless legs. Patient's weight will be monitored closely. Demonstration and practice of PLB using pulse oximeter. Patient able to return demonstration satisfactorily. Safety and hand hygiene in the exercise area reviewed with patient. Patient voices understanding of the information reviewed. Department expectations discussed with patient and achievable goals were set. The patient shows enthusiasm about attending the program and we look forward to working with this nice gentleman. The patient is scheduled for a 6 min walk test on Tuesday 9/13 at 4:00 and to begin exercise on Thursday 9/15 in the 10:30 class.   45 minutes was spent on a variety of activities such as assessment of the patient, obtaining baseline data including height, weight, BMI, and grip strength, verifying medical  history, allergies, and current medications, and teaching patient strategies for performing tasks with less respiratory effort with emphasis on pursed lip breathing.

## 2015-05-20 ENCOUNTER — Encounter: Payer: Self-pay | Admitting: *Deleted

## 2015-05-25 ENCOUNTER — Ambulatory Visit (INDEPENDENT_AMBULATORY_CARE_PROVIDER_SITE_OTHER): Payer: Medicare Other | Admitting: Cardiology

## 2015-05-25 ENCOUNTER — Encounter: Payer: Self-pay | Admitting: Cardiology

## 2015-05-25 VITALS — BP 116/70 | HR 88 | Ht 70.0 in | Wt 180.4 lb

## 2015-05-25 DIAGNOSIS — Z9889 Other specified postprocedural states: Secondary | ICD-10-CM | POA: Diagnosis not present

## 2015-05-25 DIAGNOSIS — D150 Benign neoplasm of thymus: Secondary | ICD-10-CM | POA: Diagnosis not present

## 2015-05-25 DIAGNOSIS — I480 Paroxysmal atrial fibrillation: Secondary | ICD-10-CM | POA: Diagnosis not present

## 2015-05-25 DIAGNOSIS — R490 Dysphonia: Secondary | ICD-10-CM | POA: Diagnosis not present

## 2015-05-25 DIAGNOSIS — R0609 Other forms of dyspnea: Secondary | ICD-10-CM

## 2015-05-25 DIAGNOSIS — Z79899 Other long term (current) drug therapy: Secondary | ICD-10-CM | POA: Diagnosis not present

## 2015-05-25 DIAGNOSIS — J3801 Paralysis of vocal cords and larynx, unilateral: Secondary | ICD-10-CM | POA: Diagnosis not present

## 2015-05-25 DIAGNOSIS — J986 Disorders of diaphragm: Secondary | ICD-10-CM | POA: Diagnosis not present

## 2015-05-25 MED ORDER — METOPROLOL TARTRATE 25 MG PO TABS: 12.5000 mg | ORAL_TABLET | Freq: Two times a day (BID) | ORAL | Status: DC

## 2015-05-25 NOTE — Patient Instructions (Addendum)
Medication Instructions:  Your physician recommends that you continue on your current medications as directed. Please refer to the Current Medication list given to you today.  Labwork: NONE  Testing/Procedures: NONE  Follow-Up: Your physician recommends that you schedule a follow-up appointment in: [place]. NP ON 11/25/15

## 2015-05-25 NOTE — Progress Notes (Signed)
Cardiology Office Note   Date:  05/25/2015   ID:  [name] [name], DOB 04-16-1941, MRN [id number]  PCP:  Kennon Portela, MD  Cardiologist: Darlin Coco MD  No chief complaint on file.     History of Present Illness: [name] [name] is a 74 y.o. male who presents for follow-up cardiology office visit.  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 He was admitted 11/26/14 for video-assisted thoracoscopy with resection of an anterior mediastinal mass. On post op day #1, he went into AFib with RVR. The patient has a past history of atrial fibrillation. He had had 3 prior cardioversions and had been on long-term Coumadin. Eight years ago he saw Dr. Ola [name] at [facility] and underwent an ablation of his atrial fibrillation. He had had an excellent response and he had no recurrent atrial fibrillation until recently. In the hospital, he was treated with IV Dilt for rate control. He was also placed on Amiodarone. His initial ECG was suggestive of pericarditis. He was treated with Ibuprofen x 1 week. He converted to NSR. CHADS2-VASc=1 (age > 34). His echo demonstrated normal LVF. His amiodarone was discontinued on 01/06/15 when he was seen by Dr. Roxan [name] in follow-up. He has been having fatigue and dyspnea. 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 His most recent chest x-ray showed slight improvement in the left phrenic nerve palsy. He is discouraged about the slow pace of improvement however. He gives speeches about orthodontics all over the [place] and his voice at the present time is quite soft and he is unable to do the speaking he is accustomed to doing. Earlier today he was seen at [facility].  They did a sniff test which shows some movement of the left diaphragm now.  This is very encouraging to him.  He also feels that his shortness of breath has improved  slightly and the strength of his voice is also improved slightly.  He is to start pulmonary rehabilitation this week.  He has been walking 3 miles 3 times a week. He has been experiencing occasional short runs of atrial fibrillation.  He has been taking his metoprolol 12.5 mg twice a day and we refilled today.  Past Medical History  Diagnosis Date  . Atrial fibrillation   . Colon polyps     hyperplastic and tubular adenomatous  . Thrombocytopenia   . Radiation     for prostate cancer  . Neuromuscular disorder   . Prostate cancer   . Erectile dysfunction following radical prostatectomy   . Mediastinal mass     per CT CHEST 11/07/14 @ [facility].[name] KitchenANTERIOR...4.2 X 3.3 cm soft tissue  . Dysrhythmia     hx at fib- ablation 8 yrs ago    Past Surgical History  Procedure Laterality Date  . Knee arthroscopy Left   . Tonsillectomy    . Back surgery    . Robot assisted laparoscopic radical prostatectomy  2009    radiation Tx 2011  . Vasectomy    . Colonoscopy    . Polypectomy    . Atrial ablation surgery      for a fib x 5 years ago  . Flexible sigmoidoscopy  02/10/2012    Procedure: FLEXIBLE SIGMOIDOSCOPY;  Surgeon: [name] Shipper, MD;  Location: [facility];  Service: Endoscopy;  Laterality: N/A;  need APC   . Prostate surgery    .  Video assisted thoracoscopy Left 11/26/2014    Procedure: VIDEO ASSISTED THORACOSCOPY;  Surgeon: Melrose Nakayama, MD;  Location: [place];  Service: Thoracic;  Laterality: Left;  . Resection of mediastinal mass N/A 11/26/2014    Procedure: RESECTION OF ANTERIOR MEDIASTINAL MASS;  Surgeon: Melrose Nakayama, MD;  Location: [place];  Service: Thoracic;  Laterality: N/A;  . Lead removal N/A 11/26/2014    Procedure: CRYO INTERCOSTAL NERVE BLOCK;  Surgeon: Melrose Nakayama, MD;  Location: [place];  Service: Thoracic;  Laterality: N/A;  . Cystoscopy N/A 11/26/2014    Procedure: [name] [name];  Surgeon: [name] Loser, MD;  Location: [place];  Service: Urology;  Laterality: N/A;  . Cystoscopy with urethral dilatation N/A 11/26/2014    Procedure: CYSTOSCOPY WITH URETHRAL DILATATION WITH INSERTION OF FOLEY;  Surgeon: [name] Loser, MD;  Location: [place];  Service: Urology;  Laterality: N/A;     Current Outpatient Prescriptions  Medication Sig Dispense Refill  . acetaminophen (TYLENOL) 325 MG tablet Take 650 mg by mouth every 4 (four) hours as needed (pain).     . Calcium Carb-Cholecalciferol (CALCIUM PLUS VITAMIN D3) 600-800 MG-UNIT TABS Take 2 tablets by mouth daily.     . Investigational - Study Medication Take 4 tablets by mouth 1 day or 1 dose. Study program for [facility]    . metoprolol tartrate (LOPRESSOR) 25 MG tablet Take 0.5 tablets (12.5 mg total) by mouth 2 (two) times daily. 90 tablet 3   No current facility-administered medications for this visit.    Allergies:   Review of patient's allergies indicates no known allergies.    Social History:  The patient  reports that he has never smoked. He has never used smokeless tobacco. He reports that he does not drink alcohol or use illicit drugs.   Family History:  The patient's family history includes Healthy in his sister and sister; Heart attack in his mother; Heart disease in his mother; Prostate cancer in his father. There is no history of Colon cancer.    ROS:  Please see the history of present illness.   Otherwise, review of systems are positive for none.   All other systems are reviewed and negative.    PHYSICAL EXAM: VS:  BP 116/70 mmHg  Pulse 88  Ht 5\' 10"  (1.778 m)  Wt 180 lb 6.4 oz (81.829 kg)  BMI 25.88 kg/m2 , BMI Body mass index is 25.88 kg/(m^2). GEN: Well nourished, well developed, in no acute distress HEENT: normal Neck: no JVD, carotid bruits, or masses Cardiac: RRR; no murmurs, rubs, or gallops,no edema  Respiratory:  clear to auscultation bilaterally, normal work of breathing decreased breath sounds at left base. GI:  soft, nontender, nondistended, + BS MS: no deformity or atrophy Skin: warm and dry, no rash Neuro:  Strength and sensation are intact Psych: euthymic mood, full affect   EKG:  EKG is ordered today. The ekg ordered today demonstrates normal sinus rhythm with occasional immature atrial complexes.   Recent Labs: 11/28/2014: ALT 15; BUN 15; Creatinine, Ser 0.84; Hemoglobin 12.5*; Platelets 163; Potassium 3.8; Sodium 135 01/06/2015: TSH 2.405    Lipid Panel    Component Value Date/Time   CHOL 197 08/15/2013 1026   TRIG 98 08/15/2013 1026   HDL 40 08/15/2013 1026   CHOLHDL 4.9 08/15/2013 1026   VLDL 20 08/15/2013 1026   LDLCALC 137* 08/15/2013 1026      Wt Readings from Last 3 Encounters:  05/25/15 180 lb 6.4 oz (  81.829 kg)  05/15/15 181 lb 3.5 oz (82.2 kg)  05/12/15 177 lb (80.287 kg)         ASSESSMENT AND PLAN:  1. Paroxysmal atrial fibrillation, status post ablation 8 years ago. Maintaining normal sinus rhythm off amiodarone. CHADSSVASC score of 1 for age. Not on anticoagulation   Current medicines are reviewed at length with the patient today.  The patient does not have concerns regarding medicines.  The following changes have been made:  no change  Labs/ tests ordered today include:   Orders Placed This Encounter  Procedures  . EKG 12-Lead     Disposition: Continue current medication.  Continue regular exercise.  Recheck here in 6 months for office visit and EKG.  He will be seeing his new cardiologist then.  Berna Spare MD 05/25/2015 5:52 PM    [facility] [address], [address], [place]  [address] Phone: [phone]; Fax: [phone]

## 2015-05-26 ENCOUNTER — Other Ambulatory Visit: Payer: Self-pay | Admitting: Internal Medicine

## 2015-05-26 ENCOUNTER — Encounter (HOSPITAL_COMMUNITY)
Admission: RE | Admit: 2015-05-26 | Discharge: 2015-05-26 | Disposition: A | Discharge status: Home / Self Care | Payer: Medicare Other | Source: Ambulatory Visit | Attending: Pulmonary Disease | Admitting: Pulmonary Disease

## 2015-05-26 DIAGNOSIS — K2289 Other specified disease of esophagus: Secondary | ICD-10-CM

## 2015-05-26 DIAGNOSIS — J9612 Chronic respiratory failure with hypercapnia: Secondary | ICD-10-CM | POA: Diagnosis not present

## 2015-05-26 DIAGNOSIS — R1314 Dysphagia, pharyngoesophageal phase: Secondary | ICD-10-CM

## 2015-05-26 DIAGNOSIS — K228 Other specified diseases of esophagus: Secondary | ICD-10-CM

## 2015-05-26 NOTE — Progress Notes (Signed)
[name] [name] completed a Six-Minute Walk Test on 05/26/15 . [name] [name] walked 930 feet with 0 breaks.  The patient's lowest oxygen saturation was 95 %, highest heart rate was 99 bpm , and highest blood pressure was 108/68. The patient was on room air. Patient stated that nothing hindered their walk test.

## 2015-05-27 ENCOUNTER — Other Ambulatory Visit: Payer: Self-pay

## 2015-05-27 ENCOUNTER — Telehealth: Payer: Self-pay | Admitting: Internal Medicine

## 2015-05-27 NOTE — Telephone Encounter (Signed)
Left message for pt to call back.  Pt states he is having esophageal pain due to complications from surgery recently with Dr. Roxan Hockey. Pt states he was told to call and schedule an appt to see Dr. Henrene Pastor. Offered pt an appt with [facility] this week but he would prefer to see Dr. Henrene Pastor. Explained Dr. Henrene Pastor was out of town but that a note would be sent to Dr. Henrene Pastor and we could call him when he returns. Pt preferred to do this. Please advise if pt can be worked in.

## 2015-05-28 ENCOUNTER — Encounter (HOSPITAL_COMMUNITY)
Admission: RE | Admit: 2015-05-28 | Discharge: 2015-05-28 | Disposition: A | Discharge status: Home / Self Care | Payer: Medicare Other | Source: Ambulatory Visit | Attending: Pulmonary Disease | Admitting: Pulmonary Disease

## 2015-05-28 DIAGNOSIS — J9612 Chronic respiratory failure with hypercapnia: Secondary | ICD-10-CM | POA: Diagnosis not present

## 2015-05-28 NOTE — Progress Notes (Signed)
Today, [name] [name] exercised at [employer]. Service time was from 1030 to 1230.  The patient exercised by performing aerobic, strengthening, and stretching exercises. Oxygen saturation, heart rate, blood pressure, rate of perceived exertion, and shortness of breath were all monitored before, during, and after exercise. [name] [name] presented with no problems at today's exercise session.  The patient did not have an increase in workload intensity during today's exercise session.  Pre-exercise vitals: . Weight kg: 80.5 . Liters of O2: ra . SpO2: 96 . HR: 72 . BP: 98/68 . CBG: na  Exercise vitals: . Highest heartrate:  96 . Lowest oxygen saturation: 96 . Highest blood pressure: 124/76 . Liters of 02: ra  Post-exercise vitals: . SpO2: 96 . HR: 77 . BP: 110/74 . Liters of O2: ra . CBG: na Dr. Brand Males, Medical Director Dr. Carles Collet is immediately available during today's Pulmonary Rehab session for [name] [name] on 05/28/2015  at 1030 class time.  [name] Kitchen

## 2015-06-01 NOTE — Telephone Encounter (Signed)
Add on tomorrow at 3:30 pm

## 2015-06-01 NOTE — Telephone Encounter (Signed)
Spoke with pt and he will be here tomorrow at 3:30pm.

## 2015-06-02 ENCOUNTER — Ambulatory Visit (INDEPENDENT_AMBULATORY_CARE_PROVIDER_SITE_OTHER): Payer: Medicare Other | Admitting: Internal Medicine

## 2015-06-02 ENCOUNTER — Encounter (HOSPITAL_COMMUNITY)
Admission: RE | Admit: 2015-06-02 | Discharge: 2015-06-02 | Disposition: A | Discharge status: Home / Self Care | Payer: Medicare Other | Source: Ambulatory Visit | Attending: Pulmonary Disease | Admitting: Pulmonary Disease

## 2015-06-02 ENCOUNTER — Encounter: Payer: Self-pay | Admitting: Internal Medicine

## 2015-06-02 VITALS — BP 96/60 | HR 64 | Ht 70.0 in | Wt 175.6 lb

## 2015-06-02 DIAGNOSIS — R634 Abnormal weight loss: Secondary | ICD-10-CM

## 2015-06-02 DIAGNOSIS — R1084 Generalized abdominal pain: Secondary | ICD-10-CM | POA: Diagnosis not present

## 2015-06-02 DIAGNOSIS — J9612 Chronic respiratory failure with hypercapnia: Secondary | ICD-10-CM | POA: Diagnosis not present

## 2015-06-02 DIAGNOSIS — R1013 Epigastric pain: Secondary | ICD-10-CM | POA: Diagnosis not present

## 2015-06-02 MED ORDER — OMEPRAZOLE 40 MG PO CPDR: 40.0000 mg | DELAYED_RELEASE_CAPSULE | Freq: Every day | ORAL | Status: DC

## 2015-06-02 NOTE — Progress Notes (Signed)
[name] [name] 74 y.o. male Nutrition Note Spoke with pt. Pt is overweight. Per pt and EMR, pt has unintentionally lost 20 lb over the past 6 months. Pt feels wt loss due to surgery, food not tasting good (e.g. Metallic taste), and decreased appetite. Per discussion, pt is not interested in an appetite stimulant at this time. Pt educated re: High Calorie, High Protein diet. Pt expressed understanding of the information reviewed.  No results found for: HGBA1C Vitals - 1 value per visit 05/25/2015 05/15/2015 05/12/2015 05/01/2015 03/24/2015  Weight (lb) 180.4 181.22 177 181 192   Vitals - 1 value per visit 02/26/2015 02/10/2015 02/03/2015 01/06/2015  Weight (lb) 192.3 193 193.12 193   Vitals - 1 value per visit 12/22/2014 12/11/2014 11/30/2014 11/29/2014  Weight (lb) 193 200  199.2   Nutrition Diagnosis ? Food-and nutrition-related knowledge deficit related to lack of exposure to information as related to diagnosis of pulmonary disease ? Unintentional wt loss related to decreased appetite and decreased food intake as evidenced by wt loss of 10% in 6 months. ?  Nutrition Rx/Est. Daily Nutrition Needs for: ? wt gain 2900-3400 Kcal  100-120 gm protein   1500 mg or less sodium Nutrition Intervention ? Pt's individual nutrition plan and goals reviewed with pt. ? Benefits of adopting healthy eating habits discussed when pt's Rate Your Plate reviewed. ? Handouts given for: High Calorie, High Protein diet, Ways to Increase Calories and Protein, and High Calorie, High Protein Recipes ? Pt to attend the Nutrition and Lung Disease class ? Continual client-centered nutrition education by RD, as part of interdisciplinary care. Goal(s) 1. The pt will recognize symptoms that can interfere with adequate oral intake, such as shortness of breath, N/V, early satiety, fatigue, ability to secure and prepare food, taste and smell changes, chewing/swallowing difficulties, and/ or pain when eating. 2. The pt will consume  high-energy, high-nutrient dense beverages when necessary to compensate for decreased oral intake of solid foods. 3. Identify food quantities necessary to achieve prevent further wt loss/ promote wt gain up to pt's UBW of 200 lb.  Monitor and Evaluate progress toward nutrition goal with team.   Derek Mound, M.Ed, RD, LDN, CDE 06/02/2015 11:57 AM

## 2015-06-02 NOTE — Patient Instructions (Addendum)
We have sent the following medications to your pharmacy for you to pick up at your convenience:  Omeprazole  Please follow up with Dr. Henrene Pastor on 07/16/2015 at 1:30pm    You have been scheduled for an Upper GI Series at [facility].  Your appointment is on 06/12/2015 at  11:00am . Please arrive 15 minutes prior to your test for registration. Make sure not to eat or drink anything after midnight on the night before your test. If you need to reschedule, please call radiology at [phone]. ________________________________________________________________ An upper GI series uses x rays to help diagnose problems of the upper GI tract, which includes the esophagus, stomach, and duodenum. The duodenum is the first part of the small intestine. An upper GI series is conducted by a radiology technologist or a radiologist-a doctor who specializes in x-ray imaging-at a hospital or outpatient center. While sitting or standing in front of an x-ray machine, the patient drinks barium liquid, which is often white and has a chalky consistency and taste. The barium liquid coats the lining of the upper GI tract and makes signs of disease show up more clearly on x rays. X-ray video, called fluoroscopy, is used to view the barium liquid moving through the esophagus, stomach, and duodenum. Additional x rays and fluoroscopy are performed while the patient lies on an x-ray table. To fully coat the upper GI tract with barium liquid, the technologist or radiologist may press on the abdomen or ask the patient to change position. Patients hold still in various positions, allowing the technologist or radiologist to take x rays of the upper GI tract at different angles. If a technologist conducts the upper GI series, a radiologist will later examine the images to look for problems.  This test typically takes about 1 hour to complete. __________________________________________________________________

## 2015-06-02 NOTE — Progress Notes (Signed)
Today, Seith exercised at [employer]. Service time was from 10:30am to 12:10pm.  The patient exercised by performing aerobic, strengthening, and stretching exercises. Oxygen saturation, heart rate, blood pressure, rate of perceived exertion, and shortness of breath were all monitored before, during, and after exercise. Ammon presented with no problems at today's exercise session.  The patient did not have an increase in workload intensity during today's exercise session.  Pre-exercise vitals: . Weight kg: 79.2 . Liters of O2: ra . SpO2: 97 . HR: 81 . BP: 102/60 . CBG: na  Exercise vitals: . Highest heartrate:  94 . Lowest oxygen saturation: 94 . Highest blood pressure: 122/60 . Liters of 02: ra  Post-exercise vitals: . SpO2: 96 . HR: 75 . BP: 100/60 . Liters of O2: ra . CBG: na  Dr. Brand Males, Medical Director Dr. Carles Collet is immediately available during today's Pulmonary Rehab session for [name] on 06/02/15 at 10:30am class time.

## 2015-06-02 NOTE — Progress Notes (Signed)
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 He was found to have moderate diverticulosis and radiation proctitis. Subsequently underwent flexible sigmoidoscopy with APC treatment 02/10/2012. No problems with significant recurrent rectal bleeding since. Earlier this year he was found to have ongoing rising PSA. No evidence for gross recurrent prostate cancer but rather biochemical disease. He was to be enrolled in EMBARK trial looking at a hormone type of medication. He underwent PET scan was found to have abnormal activity of the thymus gland. This was consistent with thymoma for which she underwent left VATS procedure 11/26/2014. 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 He has been undergoing pulmonary and ENT evaluations for such. He presents himself today with chief complaint of decreased appetite, metallic taste with nausea, epigastric discomfort, and associated weight loss. He reports no weight loss postoperatively until initiating study drug in mid June. Since that time he is lost about 20 pounds. It is clear that his weight loss is directly related to lack of appetite. He denies pyrosis or dysphagia. Tends to have chronic constipation for which she uses MiraLAX. He has had significant decrease in his activity level as well as his capacity as a Glass blower/designer. Outside office notes, x-rays, laboratories reviewed  REVIEW OF SYSTEMS:  All non-GI ROS negative except for cough  Past Medical History  Diagnosis Date  . Atrial fibrillation   . Colon polyps     hyperplastic and tubular adenomatous  . Thrombocytopenia   . Radiation     for prostate cancer  . Neuromuscular disorder   . Prostate cancer   . Erectile dysfunction following radical prostatectomy   . Mediastinal  mass     per CT CHEST 11/07/14 @ [facility].[name] KitchenANTERIOR...4.2 X 3.3 cm soft tissue  . Dysrhythmia     hx at fib- ablation 8 yrs ago    Past Surgical History  Procedure Laterality Date  . Knee arthroscopy Left   . Tonsillectomy    . Back surgery    . Robot assisted laparoscopic radical prostatectomy  2009    radiation Tx 2011  . Vasectomy    . Colonoscopy    . Polypectomy    . Atrial ablation surgery      for a fib x 5 years ago  . Flexible sigmoidoscopy  02/10/2012    Procedure: FLEXIBLE SIGMOIDOSCOPY;  Surgeon: Irene Shipper, MD;  Location: [facility];  Service: Endoscopy;  Laterality: N/A;  need APC   . Prostate surgery    . Video assisted thoracoscopy Left 11/26/2014    Procedure: VIDEO ASSISTED THORACOSCOPY;  Surgeon: Melrose Nakayama, MD;  Location: [place];  Service: Thoracic;  Laterality: Left;  . Resection of mediastinal mass N/A 11/26/2014    Procedure: RESECTION OF ANTERIOR MEDIASTINAL MASS;  Surgeon: Melrose Nakayama, MD;  Location: [place];  Service: Thoracic;  Laterality: N/A;  . Lead removal N/A 11/26/2014    Procedure: CRYO INTERCOSTAL NERVE BLOCK;  Surgeon: Melrose Nakayama, MD;  Location: [place];  Service: Thoracic;  Laterality: N/A;  . Cystoscopy N/A 11/26/2014    Procedure: Erlene Quan;  Surgeon: Bjorn Loser, MD;  Location: [place];  Service: Urology;  Laterality: N/A;  . Cystoscopy with urethral dilatation N/A 11/26/2014    Procedure: CYSTOSCOPY WITH URETHRAL DILATATION WITH INSERTION OF FOLEY;  Surgeon: Bjorn Loser,  MD;  Location: [place];  Service: Urology;  Laterality: N/A;    Social History [name] [name]  reports that he has never smoked. He has never used smokeless tobacco. He reports that he does not drink alcohol or use illicit drugs.  family history includes Healthy in his sister and sister; Heart attack in his mother; Heart disease in his mother; Prostate cancer in his father. There is no history of Colon cancer.  No  Known Allergies     PHYSICAL EXAMINATION: Vital signs: BP 96/60 mmHg  Pulse 64  Ht 5\' 10"  (1.778 m)  Wt 175 lb 9.6 oz (79.652 kg)  BMI 25.20 kg/m2  Constitutional: Pleasant, generally well-appearing, no acute distress Psychiatric: alert and oriented x3, cooperative Eyes: extraocular movements intact, anicteric, conjunctiva pink Mouth: oral pharynx moist, no lesions. No thrush Neck: supple no lymphadenopathy Cardiovascular: heart regular rate and rhythm, no murmur Lungs: clear to auscultation bilaterally with diminished breath sounds left base Abdomen: soft, nontender, nondistended, no obvious ascites, no peritoneal signs, normal bowel sounds, no organomegaly Rectal: Omitted Extremities: no clubbing cyanosis or lower extremity edema bilaterally Skin: no lesions on visible extremities Neuro: No focal deficits.   ASSESSMENT:  #1. Weight loss. I believe that this is secondary to anorexia. Possible causes for anorexia include organic GI pathology such as GERD or ulcer disease. Alternatively, may be related to the study drug which he started 3 months ago. The temporal relationship exists. Finally, he may have an element of depression related to his multiple recent medical issues. Would not be surprised. Certainly this could affect appetite with resultant weight loss. #2. History of radiation proctitis status post successful APC therapy May 2013   PLAN:  #1. Upper GI series to rule out significant anatomic abnormalities of the upper GI mucosa. This in lieu of upper endoscopy given his hemidiaphragm paralysis. We will call him with the results #2. Prescribe omeprazole 40 mg daily. This would address the possibility of acid peptic disorders as contributors, such as GERD or peptic ulcer. Several refills provided #3. GI office follow-up with me in 4 weeks. #4. If symptoms persist despite the above, then consider holding study drug (he will talk to his urologist if necessary) and see if  symptoms improve #5. Consider short-term (months-year) antidepressive therapy if symptoms persist despite all the above. We discussed each of these measures and the rationale in detail #6. Ongoing care with his other specialists regarding prostate cancer, diaphragmatic paralysis, and hoarseness  A copy of this dictation has been sent to Dr. Alinda Money, Dr. Elder Cyphers and Dr. Roxan Hockey

## 2015-06-04 ENCOUNTER — Encounter (HOSPITAL_COMMUNITY)
Admission: RE | Admit: 2015-06-04 | Discharge: 2015-06-04 | Disposition: A | Discharge status: Home / Self Care | Payer: Medicare Other | Source: Ambulatory Visit | Attending: Pulmonary Disease | Admitting: Pulmonary Disease

## 2015-06-04 DIAGNOSIS — J9612 Chronic respiratory failure with hypercapnia: Secondary | ICD-10-CM | POA: Diagnosis not present

## 2015-06-09 ENCOUNTER — Encounter (HOSPITAL_COMMUNITY)
Admission: RE | Admit: 2015-06-09 | Discharge: 2015-06-09 | Disposition: A | Discharge status: Home / Self Care | Payer: Medicare Other | Source: Ambulatory Visit | Attending: Pulmonary Disease | Admitting: Pulmonary Disease

## 2015-06-09 DIAGNOSIS — J9612 Chronic respiratory failure with hypercapnia: Secondary | ICD-10-CM | POA: Diagnosis not present

## 2015-06-09 NOTE — Progress Notes (Signed)
Late entry for encounter on 06/04/15  Today, Unique exercised at [employer]. Service time was from 1030 to 1230.  The patient exercised by performing aerobic, strengthening, and stretching exercises. Oxygen saturation, heart rate, blood pressure, rate of perceived exertion, and shortness of breath were all monitored before, during, and after exercise. Lankford presented with no problems at today's exercise session. Ching also attended an education session on warning signs and symptoms of cardiac and pulmonary emergencies.  The patient did noy have an increase in workload intensity during today's exercise session.  Pre-exercise vitals: . Weight kg: 79.3 . Liters of O2: ra . SpO2: 97 . HR: 67 . BP: 102/60 . CBG: na  Exercise vitals: . Highest heartrate:  84 . Lowest oxygen saturation: 94 . Highest blood pressure: 120/66 . Liters of 02: ra  Post-exercise vitals: . SpO2:  . HR:  . BP: 110/70 . Liters of O2: ra . CBG: na  Dr. Brand Males, Medical Director Dr. Ree Kida is immediately available during today's Pulmonary Rehab session for [name] on 06/04/15 at 1030 class time.

## 2015-06-09 NOTE — Progress Notes (Signed)
Today, [name] [name] exercised at [employer]. Service time was from 1030 to 1215.  The patient exercised by performing aerobic, strengthening, and stretching exercises. Oxygen saturation, heart rate, blood pressure, rate of perceived exertion, and shortness of breath were all monitored before, during, and after exercise. [name] presented with no problems at today's exercise session.  The patient did  have an increase in workload intensity during today's exercise session.  Pre-exercise vitals: . Weight kg: 80.0 . Liters of O2: ra . SpO2: 98 . HR: 65 . BP: 96/60 . CBG: na  Exercise vitals: . Highest heartrate:  90 . Lowest oxygen saturation: 94 . Highest blood pressure: 116/68 . Liters of 02: ra  Post-exercise vitals: . SpO2: 97 . HR: 78 . BP: 100/74 . Liters of O2: ra . CBG: na Dr. Brand Males, Medical Director Dr. Ree Kida is immediately available during today's Pulmonary Rehab session for [name] [name] on 06/09/2015  at 1030 class time.  [name] Kitchen

## 2015-06-11 ENCOUNTER — Encounter (HOSPITAL_COMMUNITY)
Admission: RE | Admit: 2015-06-11 | Discharge: 2015-06-11 | Disposition: A | Discharge status: Home / Self Care | Payer: Medicare Other | Source: Ambulatory Visit | Attending: Pulmonary Disease | Admitting: Pulmonary Disease

## 2015-06-11 ENCOUNTER — Encounter: Payer: Self-pay | Admitting: Pulmonary Disease

## 2015-06-11 ENCOUNTER — Ambulatory Visit (INDEPENDENT_AMBULATORY_CARE_PROVIDER_SITE_OTHER): Payer: Medicare Other | Admitting: Pulmonary Disease

## 2015-06-11 VITALS — BP 130/68 | HR 76 | Ht 69.5 in | Wt 178.0 lb

## 2015-06-11 DIAGNOSIS — J9612 Chronic respiratory failure with hypercapnia: Secondary | ICD-10-CM

## 2015-06-11 DIAGNOSIS — R0602 Shortness of breath: Secondary | ICD-10-CM | POA: Diagnosis not present

## 2015-06-11 NOTE — Progress Notes (Signed)
Today, Richrd exercised at [employer]. Service time was from 10:30am to 12:40pm.  The patient exercised by performing aerobic, strengthening, and stretching exercises. Oxygen saturation, heart rate, blood pressure, rate of perceived exertion, and shortness of breath were all monitored before, during, and after exercise. Eland presented with no problems at today's exercise session. The patient attended education today with Trish Fountain on Anatomy and Physiology.  The patient did not have an increase in workload intensity during today's exercise session.  Pre-exercise vitals: . Weight kg: 80.3 . Liters of O2: ra . SpO2: 97 . HR: 67 . BP: 92/64 . CBG: na  Exercise vitals: . Highest heartrate:  80 . Lowest oxygen saturation: 93 . Highest blood pressure: 120/64 . Liters of 02: ra  Post-exercise vitals: . SpO2:  . HR: . BP: 102/62 . Liters of O2: ra . CBG: na  Dr. Brand Males, Medical Director Dr. Carles Collet is immediately available during today's Pulmonary Rehab session for [name] on 06/11/15 at 10:30am class time.

## 2015-06-11 NOTE — Addendum Note (Signed)
Addended by: Desmond Dike C on: 06/11/2015 02:18 PM   Modules accepted: Orders

## 2015-06-11 NOTE — Progress Notes (Signed)
   Subjective:    Patient ID: [name] [name], male    DOB: July 28, 1941, 74 y.o.   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 He has left-sided diaphragm paralysis.   HPI Chief Complaint  Patient presents with  . Follow-up    pt states his SOB has improved since last ov but still present.  pt does note nonprod cough.  in pulm rehab.      [name] [name] has bee ndoing well with pulmonary rehab.   He is feelin better. His breathing has improved.   He says that his incentive spirometry numbers have improved significantly.  He still feels weak now however despite this, he just feels like he is not recovering as well as expected. He continues to participate.    Past Medical History  Diagnosis Date  . Atrial fibrillation   . Colon polyps     hyperplastic and tubular adenomatous  . Thrombocytopenia   . Radiation     for prostate cancer  . Neuromuscular disorder   . Prostate cancer   . Erectile dysfunction following radical prostatectomy   . Mediastinal mass     per CT CHEST 11/07/14 @ [facility].[name] KitchenANTERIOR...4.2 X 3.3 cm soft tissue  . Dysrhythmia     hx at fib- ablation 8 yrs ago      Review of Systems     Objective:   Physical Exam  Filed Vitals:   06/11/15 1345  BP: 130/68  Pulse: 76  Height: 5' 9.5" (1.765 m)  Weight: 178 lb (80.74 kg)  SpO2: 97%   Gen: well appearing HENT: OP clear, TM's clear, neck supple PULM: CTA B, normal effort, diminished slightly left base CV: RRR, no mgr, trace edema GI: BS+, soft, nontender Derm: no cyanosis or rash Psyche: normal mood and affect   9/14 diaphragm fluoroscopy [facility] > normal     Assessment & Plan:  Chronic respiratory failure with hypercapnia [name] [name] seems to be improving. His diaphragmatic fluoroscopy from [facility] on September 14 showed normal bilateral diaphragm function. I think that some degree of his shortness  of breath was due to deconditioning and his cancer treatment. Today he seems to be headed in the right direction. I'm encouraged by the fact that his incentive spirometry numbers have improved.  Plan: Repeat chest x-ray today to evaluate left side hemidiaphragm elevation Continue pulmonary rehabilitation Follow up with me if shortness of breath does not continue to improve.    Current outpatient prescriptions:  .  acetaminophen (TYLENOL) 325 MG tablet, Take 650 mg by mouth every 4 (four) hours as needed (pain). , Disp: , Rfl:  .  Calcium Carb-Cholecalciferol (CALCIUM PLUS VITAMIN D3) 600-800 MG-UNIT TABS, Take 2 tablets by mouth daily. , Disp: , Rfl:  .  Investigational - Study Medication, Take 4 tablets by mouth 1 day or 1 dose. Study program for [facility], Disp: , Rfl:  .  metoprolol tartrate (LOPRESSOR) 25 MG tablet, Take 0.5 tablets (12.5 mg total) by mouth 2 (two) times daily., Disp: 90 tablet, Rfl: 3 .  omeprazole (PRILOSEC) 40 MG capsule, Take 1 capsule (40 mg total) by mouth daily., Disp: 30 capsule, Rfl: 6

## 2015-06-11 NOTE — Patient Instructions (Signed)
We will call you with the results of the chest x-ray Keep exercising regularly We will see you back if the shortness of breath does not improve.

## 2015-06-11 NOTE — Assessment & Plan Note (Signed)
[name] [name] seems to be improving. His diaphragmatic fluoroscopy from [facility] on September 14 showed normal bilateral diaphragm function. I think that some degree of his shortness of breath was due to deconditioning and his cancer treatment. Today he seems to be headed in the right direction. I'm encouraged by the fact that his incentive spirometry numbers have improved.  Plan: Repeat chest x-ray today to evaluate left side hemidiaphragm elevation Continue pulmonary rehabilitation Follow up with me if shortness of breath does not continue to improve.

## 2015-06-12 ENCOUNTER — Other Ambulatory Visit: Payer: Self-pay

## 2015-06-12 ENCOUNTER — Ambulatory Visit (HOSPITAL_COMMUNITY)
Admission: RE | Admit: 2015-06-12 | Discharge: 2015-06-12 | Disposition: A | Discharge status: Home / Self Care | Payer: Medicare Other | Source: Ambulatory Visit | Attending: Internal Medicine | Admitting: Internal Medicine

## 2015-06-12 ENCOUNTER — Ambulatory Visit (HOSPITAL_COMMUNITY)
Admission: RE | Admit: 2015-06-12 | Discharge: 2015-06-12 | Disposition: A | Discharge status: Home / Self Care | Payer: Medicare Other | Source: Ambulatory Visit | Attending: Pulmonary Disease | Admitting: Pulmonary Disease

## 2015-06-12 DIAGNOSIS — K219 Gastro-esophageal reflux disease without esophagitis: Secondary | ICD-10-CM | POA: Diagnosis not present

## 2015-06-12 DIAGNOSIS — J948 Other specified pleural conditions: Secondary | ICD-10-CM | POA: Insufficient documentation

## 2015-06-12 DIAGNOSIS — R0602 Shortness of breath: Secondary | ICD-10-CM

## 2015-06-12 DIAGNOSIS — R05 Cough: Secondary | ICD-10-CM | POA: Insufficient documentation

## 2015-06-12 DIAGNOSIS — K449 Diaphragmatic hernia without obstruction or gangrene: Secondary | ICD-10-CM | POA: Diagnosis not present

## 2015-06-12 DIAGNOSIS — R634 Abnormal weight loss: Secondary | ICD-10-CM | POA: Diagnosis not present

## 2015-06-12 DIAGNOSIS — K224 Dyskinesia of esophagus: Secondary | ICD-10-CM | POA: Diagnosis not present

## 2015-06-12 DIAGNOSIS — R1084 Generalized abdominal pain: Secondary | ICD-10-CM | POA: Insufficient documentation

## 2015-06-12 DIAGNOSIS — J9612 Chronic respiratory failure with hypercapnia: Secondary | ICD-10-CM

## 2015-06-16 ENCOUNTER — Encounter (HOSPITAL_COMMUNITY)
Admission: RE | Admit: 2015-06-16 | Discharge: 2015-06-16 | Disposition: A | Discharge status: Home / Self Care | Payer: Medicare Other | Source: Ambulatory Visit | Attending: Pulmonary Disease | Admitting: Pulmonary Disease

## 2015-06-16 DIAGNOSIS — J9612 Chronic respiratory failure with hypercapnia: Secondary | ICD-10-CM | POA: Insufficient documentation

## 2015-06-16 NOTE — Progress Notes (Signed)
Today, Oziah exercised at [employer]. Service time was from 10:30am to 12:20pm.  The patient exercised by performing aerobic, strengthening, and stretching exercises. Oxygen saturation, heart rate, blood pressure, rate of perceived exertion, and shortness of breath were all monitored before, during, and after exercise. Orlo presented with no problems at today's exercise session.  The patient did have an increase in workload intensity during today's exercise session.  Pre-exercise vitals: . Weight kg: 80.5 . Liters of O2: ra . SpO2: 97 . HR: 70 . BP: 104/72 . CBG: na  Exercise vitals: . Highest heartrate:  94 . Lowest oxygen saturation: 94 . Highest blood pressure: 110/74 . Liters of 02: ra  Post-exercise vitals: . SpO2: 97 . HR: 74 . BP: 94/70 . Liters of O2: ra . CBG: na  Dr. Brand Males, Medical Director Dr. Allyson Sabal is immediately available during today's Pulmonary Rehab session for [name] on 06/15/15 at 10:30am class time

## 2015-06-18 ENCOUNTER — Encounter (HOSPITAL_COMMUNITY): Payer: Medicare Other

## 2015-06-23 ENCOUNTER — Encounter (HOSPITAL_COMMUNITY)
Admission: RE | Admit: 2015-06-23 | Discharge: 2015-06-23 | Disposition: A | Discharge status: Home / Self Care | Payer: Medicare Other | Source: Ambulatory Visit | Attending: Pulmonary Disease | Admitting: Pulmonary Disease

## 2015-06-23 DIAGNOSIS — J9612 Chronic respiratory failure with hypercapnia: Secondary | ICD-10-CM | POA: Diagnosis not present

## 2015-06-23 NOTE — Progress Notes (Signed)
I have reviewed a Home Exercise Prescription with [name] . [name] [name] is currently exercising at home.  The [name] [name] was advised to walk 3 days a week for 1 hour to 1 hour and a half minutes.  [name] and I discussed how to progress their exercise prescription.  The [name] [name] stated that their goals were to increase strength, flexibility, and get back on the golf course.  The [name] [name] stated that they understand the exercise prescription.  We reviewed exercise guidelines, target heart rate during exercise, oxygen use, weather, home pulse oximeter, endpoints for exercise, and goals.  [name] [name] is encouraged to come to me with any questions. I will continue to follow up with the [name] [name] to assist them with progression and safety.

## 2015-06-23 NOTE — Progress Notes (Signed)
Today, [name] exercised at [employer]. Service time was from 1030 to 1200.  The patient exercised by performing aerobic, strengthening, and stretching exercises. Oxygen saturation, heart rate, blood pressure, rate of perceived exertion, and shortness of breath were all monitored before, during, and after exercise. [name] [name] presented with no problems at today's exercise session.  The patient did not have an increase in workload intensity during today's exercise session.  Pre-exercise vitals: . Weight kg: 80.3 . Liters of O2: ra . SpO2: 98 . HR: 63 . BP: 104/50 . CBG: na  Exercise vitals: . Highest heartrate:  96 . Lowest oxygen saturation: 96 . Highest blood pressure: 126/64 . Liters of 02: ra  Post-exercise vitals: . SpO2: 97 . HR: 75 . BP: 100/60 . Liters of O2: ra . CBG: na  Dr. Brand Males, Medical Director Dr. Marily Memos is immediately available during today's Pulmonary Rehab session for [name] [name] on 06/23/2015 at 1030 class time.

## 2015-06-25 ENCOUNTER — Encounter (HOSPITAL_COMMUNITY): Payer: Medicare Other

## 2015-06-30 ENCOUNTER — Encounter (HOSPITAL_COMMUNITY)
Admission: RE | Admit: 2015-06-30 | Discharge: 2015-06-30 | Disposition: A | Discharge status: Home / Self Care | Payer: Medicare Other | Source: Ambulatory Visit | Attending: Pulmonary Disease | Admitting: Pulmonary Disease

## 2015-06-30 DIAGNOSIS — J9612 Chronic respiratory failure with hypercapnia: Secondary | ICD-10-CM | POA: Diagnosis not present

## 2015-06-30 NOTE — Progress Notes (Signed)
Today, Niccolas exercised at [employer]. Service time was from 10:30am to 12:05pm.  The patient exercised by performing aerobic, strengthening, and stretching exercises. Oxygen saturation, heart rate, blood pressure, rate of perceived exertion, and shortness of breath were all monitored before, during, and after exercise. Estanislado presented with no problems at today's exercise session.  The patient did have an increase in workload intensity during today's exercise session.  Pre-exercise vitals: . Weight kg: 79.3 . Liters of O2: ra . SpO2: 98 . HR: 71 . BP: 100/60 . CBG: na  Exercise vitals: . Highest heartrate:  98 . Lowest oxygen saturation: 96 . Highest blood pressure: 116/64 . Liters of 02: ra  Post-exercise vitals: . SpO2: 95 . HR: 79 . BP: 96/60 . Liters of O2: ra . CBG: na  Dr. Brand Males, Medical Director Dr. Tamala Julian is immediately available during today's Pulmonary Rehab session for [name] on 06/30/15 at 10:30am class time

## 2015-07-02 ENCOUNTER — Encounter (HOSPITAL_COMMUNITY)
Admission: RE | Admit: 2015-07-02 | Discharge: 2015-07-02 | Disposition: A | Discharge status: Home / Self Care | Payer: Medicare Other | Source: Ambulatory Visit | Attending: Pulmonary Disease | Admitting: Pulmonary Disease

## 2015-07-02 ENCOUNTER — Ambulatory Visit (INDEPENDENT_AMBULATORY_CARE_PROVIDER_SITE_OTHER): Payer: Medicare Other

## 2015-07-02 ENCOUNTER — Encounter (HOSPITAL_COMMUNITY): Payer: Medicare Other

## 2015-07-02 DIAGNOSIS — Z23 Encounter for immunization: Secondary | ICD-10-CM

## 2015-07-02 NOTE — Progress Notes (Signed)
Today, Rowen exercised at [employer]. Service time was from 1030 to 1230.  The patient exercised by performing aerobic, strengthening, and stretching exercises. Oxygen saturation, heart rate, blood pressure, rate of perceived exertion, and shortness of breath were all monitored before, during, and after exercise. Jaston presented with no problems at today's exercise session. Shoichi also attended an education session on advanced directives.  The patient did not have an increase in workload intensity during today's exercise session.  Pre-exercise vitals: . Weight kg: 80.1 . Liters of O2: ra . SpO2: 96 . HR: 70 . BP: 102/60 . CBG: na  Exercise vitals: . Highest heartrate:  86 . Lowest oxygen saturation: 96 . Highest blood pressure: 110/60 . Liters of 02: na  Post-exercise vitals: . SpO2: 96 . HR: 77 . BP: 102/60 . Liters of O2: ra . CBG: na  Dr. Brand Males, Medical Director Dr. Wynelle Cleveland is immediately available during today's Pulmonary Rehab session for Macai Sisneros on 07/02/2015 at 1030 class time.

## 2015-07-07 ENCOUNTER — Encounter (HOSPITAL_COMMUNITY)
Admission: RE | Admit: 2015-07-07 | Discharge: 2015-07-07 | Disposition: A | Discharge status: Home / Self Care | Payer: Medicare Other | Source: Ambulatory Visit | Attending: Pulmonary Disease | Admitting: Pulmonary Disease

## 2015-07-07 DIAGNOSIS — J9612 Chronic respiratory failure with hypercapnia: Secondary | ICD-10-CM | POA: Diagnosis not present

## 2015-07-07 NOTE — Progress Notes (Signed)
Today, [name] [name] exercised at [employer]. Service time was from 10:30am to 12:00pm.  The patient exercised by performing aerobic, strengthening, and stretching exercises. Oxygen saturation, heart rate, blood pressure, rate of perceived exertion, and shortness of breath were all monitored before, during, and after exercise. [name] [name] presented with no problems at today's exercise session.  The patient did not have an increase in workload intensity during today's exercise session.  Pre-exercise vitals: . Weight kg: 80.1 . Liters of O2: ra . SpO2: 99 . HR: 71 . BP: 94/60 recheck: 120/74 . CBG: na  Exercise vitals: . Highest heartrate:  95 . Lowest oxygen saturation: 96 . Highest blood pressure: 108/68 . Liters of 02: ra  Post-exercise vitals: . SpO2: 95 . HR: 72 . BP: 112/60 . Liters of O2: ra . CBG: na  Dr. Brand Males, Medical Director Dr. Wynelle Cleveland is immediately available during today's Pulmonary Rehab session for [name] [name] on 07/07/15 at 10:30am class time.

## 2015-07-09 ENCOUNTER — Encounter (HOSPITAL_COMMUNITY)
Admission: RE | Admit: 2015-07-09 | Discharge: 2015-07-09 | Disposition: A | Discharge status: Home / Self Care | Payer: Medicare Other | Source: Ambulatory Visit | Attending: Pulmonary Disease | Admitting: Pulmonary Disease

## 2015-07-09 DIAGNOSIS — J9612 Chronic respiratory failure with hypercapnia: Secondary | ICD-10-CM | POA: Diagnosis not present

## 2015-07-09 NOTE — Progress Notes (Signed)
Today, Jazir exercised at [employer]. Service time was from 1030 to 1210.  The patient exercised by performing aerobic, strengthening, and stretching exercises. Oxygen saturation, heart rate, blood pressure, rate of perceived exertion, and shortness of breath were all monitored before, during, and after exercise. Delmus presented with no problems at today's exercise session.He attended diaphragmatic and purse lid breathing class today.  The patient did  have an increase in workload intensity during today's exercise session.  Pre-exercise vitals: . Weight kg: 80.3 . Liters of O2: RA . SpO2: 94 . HR: 81 . BP: 96/64 . CBG: NA  Exercise vitals: . Highest heartrate:  87 . Lowest oxygen saturation: 98 . Highest blood pressure: 130/80 . Liters of 02: RA  Post-exercise vitals: . SpO2: 96 . HR: 74 . BP: 94/60 . Liters of O2: RA . CBG: NA Dr. Brand Males, Medical Director Dr. Tamala Julian is immediately available during today's Pulmonary Rehab session for [name] on 07/09/2015  at 1030 class time  .

## 2015-07-13 ENCOUNTER — Other Ambulatory Visit: Payer: Self-pay | Admitting: Thoracic Surgery (Cardiothoracic Vascular Surgery)

## 2015-07-13 ENCOUNTER — Ambulatory Visit: Payer: Medicare Other | Admitting: Thoracic Surgery (Cardiothoracic Vascular Surgery)

## 2015-07-13 DIAGNOSIS — E328 Other diseases of thymus: Secondary | ICD-10-CM

## 2015-07-14 ENCOUNTER — Ambulatory Visit (INDEPENDENT_AMBULATORY_CARE_PROVIDER_SITE_OTHER): Payer: Medicare Other | Admitting: Thoracic Surgery (Cardiothoracic Vascular Surgery)

## 2015-07-14 ENCOUNTER — Encounter (HOSPITAL_COMMUNITY)
Admission: RE | Admit: 2015-07-14 | Discharge: 2015-07-14 | Disposition: A | Discharge status: Home / Self Care | Payer: Medicare Other | Source: Ambulatory Visit | Attending: Pulmonary Disease | Admitting: Pulmonary Disease

## 2015-07-14 ENCOUNTER — Ambulatory Visit
Admission: RE | Admit: 2015-07-14 | Discharge: 2015-07-14 | Disposition: A | Discharge status: Home / Self Care | Payer: Medicare Other | Source: Ambulatory Visit | Attending: Thoracic Surgery (Cardiothoracic Vascular Surgery) | Admitting: Thoracic Surgery (Cardiothoracic Vascular Surgery)

## 2015-07-14 ENCOUNTER — Encounter: Payer: Self-pay | Admitting: Thoracic Surgery (Cardiothoracic Vascular Surgery)

## 2015-07-14 VITALS — BP 114/77 | HR 64 | Resp 20 | Ht 69.5 in | Wt 178.0 lb

## 2015-07-14 DIAGNOSIS — G5682 Other specified mononeuropathies of left upper limb: Secondary | ICD-10-CM | POA: Diagnosis not present

## 2015-07-14 DIAGNOSIS — E328 Other diseases of thymus: Secondary | ICD-10-CM | POA: Diagnosis not present

## 2015-07-14 DIAGNOSIS — J9612 Chronic respiratory failure with hypercapnia: Secondary | ICD-10-CM | POA: Diagnosis not present

## 2015-07-14 DIAGNOSIS — R0602 Shortness of breath: Secondary | ICD-10-CM | POA: Diagnosis not present

## 2015-07-14 DIAGNOSIS — G588 Other specified mononeuropathies: Secondary | ICD-10-CM

## 2015-07-14 NOTE — Progress Notes (Signed)
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  He is a 74 year old gentleman who was found to have a mediastinal mass on a CT scan as part of a workup for prostate cancer. I did a left thoracoscopic thymectomy on 11/26/2014. 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 Postoperatively he had atrial fibrillation, which finally resolved with amiodarone and metoprolol. He went home on day 4.  He had a lot of issues postoperatively with hoarseness and shortness of breath. 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 We followed him monthly with chest x-rays and there really wasn't much change. He had a sniff test done at [facility] in September which showed normal movement in the left hemidiaphragm.  He has been doing pulmonary rehabilitation and is happy with how that is going. He can get his incentive spirometer to the top. He is walking 4 miles a day. He is [name] back to his preoperative level, but has improved dramatically over the past month. 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 He still has some sensitivity to touch on his left chest. He's [name] having to take anything for that, but is curious as to how long it lasted.  Past Medical History  Diagnosis Date  . Atrial fibrillation ([place])   . Colon polyps     hyperplastic and tubular adenomatous  . Thrombocytopenia ([place])   . Radiation     for prostate cancer  . Neuromuscular disorder ([place])   . Prostate cancer ([place])   . Erectile dysfunction following radical prostatectomy   . Mediastinal mass     per CT CHEST 11/07/14 @ [facility].[name] KitchenANTERIOR...4.2 X 3.3 cm soft tissue  . Dysrhythmia     hx at fib- ablation 8 yrs ago   Past Surgical History  Procedure Laterality Date  . Knee  arthroscopy Left   . Tonsillectomy    . Back surgery    . Robot assisted laparoscopic radical prostatectomy  2009    radiation Tx 2011  . Vasectomy    . Colonoscopy    . Polypectomy    . Atrial ablation surgery      for a fib x 5 years ago  . Flexible sigmoidoscopy  02/10/2012    Procedure: FLEXIBLE SIGMOIDOSCOPY;  Surgeon: Irene Shipper, MD;  Location: [facility];  Service: Endoscopy;  Laterality: N/A;  need APC   . Prostate surgery    . Video assisted thoracoscopy Left 11/26/2014    Procedure: VIDEO ASSISTED THORACOSCOPY;  Surgeon: Melrose Nakayama, MD;  Location: [place];  Service: Thoracic;  Laterality: Left;  . Resection of mediastinal mass N/A 11/26/2014    Procedure: RESECTION OF ANTERIOR MEDIASTINAL MASS;  Surgeon: Melrose Nakayama, MD;  Location: [place];  Service: Thoracic;  Laterality: N/A;  . Lead removal N/A 11/26/2014    Procedure: CRYO INTERCOSTAL NERVE BLOCK;  Surgeon: Melrose Nakayama, MD;  Location: [place];  Service: Thoracic;  Laterality: N/A;  . Cystoscopy N/A 11/26/2014    Procedure: Erlene Quan;  Surgeon: Bjorn Loser, MD;  Location: [place];  Service: Urology;  Laterality: N/A;  . Cystoscopy with urethral  dilatation N/A 11/26/2014    Procedure: CYSTOSCOPY WITH URETHRAL DILATATION WITH INSERTION OF FOLEY;  Surgeon: Bjorn Loser, MD;  Location: [place];  Service: Urology;  Laterality: N/A;      Current Outpatient Prescriptions  Medication Sig Dispense Refill  . acetaminophen (TYLENOL) 325 MG tablet Take 650 mg by mouth every 4 (four) hours as needed (pain).     . Calcium Carb-Cholecalciferol (CALCIUM PLUS VITAMIN D3) 600-800 MG-UNIT TABS Take 2 tablets by mouth daily.     . Investigational - Study Medication Take 4 tablets by mouth 1 day or 1 dose. Study program for [facility]    . metoprolol tartrate (LOPRESSOR) 25 MG tablet Take 0.5 tablets (12.5 mg total) by mouth 2 (two) times daily. 90 tablet 3  . omeprazole (PRILOSEC)  40 MG capsule Take 1 capsule (40 mg total) by mouth daily. 30 capsule 6   No current facility-administered medications for this visit.    Physical Exam BP 114/77 mmHg  Pulse 64  Resp 20  Ht 5' 9.5" (1.765 m)  Wt 178 lb (80.74 kg)  BMI 25.92 kg/m2  SpO33 15% 74 year old man in no acute distress Hoarseness improved Alert and oriented 3 with no focal neurologic deficits Incisions well healed Lungs clear with equal breath sounds bilaterally  Diagnostic Tests: I personally reviewed his chest x-ray. It shows marked improvement of the elevated left hemidiaphragm.  Impression: Antwione is now 7 months out from a thoracoscopic thymectomy. This was complicated by recurrent nerve dysfunction and hoarseness and also left phrenic nerve dysfunction with an elevated with left hemidiaphragm and dyspnea. Both of those issues have improved dramatically over the past 2 months. I don't think that either is back to 100% of his baseline at this point, but hopefully he'll continue to see improvement over the next several months.  He was also having a lot of difficulty with a poor appetite and some issues with swallowing. He lost a significant amount of weight. He saw Dr. Henrene Pastor who started him on Prilosec. Upper GI showed a small hiatal hernia. He has seen in some improvement with that, but has [name] had any significant weight gain. He sees Dr. Henrene Pastor this Thursday.  He still has some problems with fatigue and lack of energy. Some of that may be due to side effects from his study drug for prostate cancer.   He has an appointment with ENT at [facility] in December or January to recheck his recurrent nerve. I will be surprised if that hasn't improved significantly since his last exam.  Plan: Return in 3 months with a PA and lateral chest x-ray to check on progress.  I'll be happy to see him back sooner if needed  I spent 10 minutes with Dr. Carlis Abbott during this visit, greater than 50% was in counseling Melrose Nakayama, MD [facility] [phone]

## 2015-07-14 NOTE — Progress Notes (Signed)
Today, Fermon exercised at [employer]. Service time was from 1030 to 1215.  The patient exercised by performing aerobic, strengthening, and stretching exercises. Oxygen saturation, heart rate, blood pressure, rate of perceived exertion, and shortness of breath were all monitored before, during, and after exercise. Matin presented with no problems at today's exercise session.  The patient did not have an increase in workload intensity during today's exercise session.  Pre-exercise vitals: . Weight kg: 80.8 . Liters of O2: RA . SpO2: 97 . HR: 61 . BP: 112/72 . CBG: NA  Exercise vitals: . Highest heartrate:  87 . Lowest oxygen saturation: 95 . Highest blood pressure: 124/66 . Liters of 02: RA  Post-exercise vitals: . SpO2: 96 . HR: 73 . BP: 90/60 . Liters of O2: RA . CBG: NA Dr. Brand Males, Medical Director Dr. Marily Memos is immediately available during today's Pulmonary Rehab session for [name] on 07/14/2015  at 1030 class time  .

## 2015-07-16 ENCOUNTER — Ambulatory Visit (INDEPENDENT_AMBULATORY_CARE_PROVIDER_SITE_OTHER): Payer: Medicare Other | Admitting: Internal Medicine

## 2015-07-16 ENCOUNTER — Encounter (HOSPITAL_COMMUNITY): Payer: Medicare Other

## 2015-07-16 ENCOUNTER — Encounter: Payer: Self-pay | Admitting: Internal Medicine

## 2015-07-16 VITALS — BP 102/70 | HR 76 | Ht 68.25 in | Wt 178.1 lb

## 2015-07-16 DIAGNOSIS — R634 Abnormal weight loss: Secondary | ICD-10-CM | POA: Diagnosis not present

## 2015-07-16 DIAGNOSIS — R630 Anorexia: Secondary | ICD-10-CM

## 2015-07-16 NOTE — Patient Instructions (Signed)
Please Follow up with Dr. Henrene Pastor as needed

## 2015-07-16 NOTE — Progress Notes (Signed)
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 This after multiple medical setbacks after thymoma surgery and initiation of study drug for prostate cancer. See that dictation for details. An upper GI series was obtained and returned unremarkable. He was empirically prescribed omeprazole 40 mg daily. Follow-up at this time requested. He is happy to report that overall he is feeling better. He feels that PPI has resulted in less metallic taste with food. His hepatitis is not normal but has improved. He has gained a few pounds. Overall strength and sense of well-being have improved. Still with significant exercise intolerance, though better. He anticipates being on prostate cancer drug for an additional 3-4 months. The timing of his anorexia, metallic taste, and weight loss were at time of initiating study drug. No new complaints  REVIEW OF SYSTEMS:  All non-GI ROS negative except for fatigue, urinary leakage, shortness of breath  Past Medical History  Diagnosis Date  . Atrial fibrillation ([place])   . Colon polyps     hyperplastic and tubular adenomatous  . Thrombocytopenia ([place])   . Radiation     for prostate cancer  . Neuromuscular disorder ([place])   . Prostate cancer ([place])   . Erectile dysfunction following radical prostatectomy   . Mediastinal mass     per CT CHEST 11/07/14 @ [facility].[name] KitchenANTERIOR...4.2 X 3.3 cm soft tissue  . Dysrhythmia     hx at fib- ablation 8 yrs ago  . Radiation proctitis     rectum  . Diverticulosis     Past Surgical History  Procedure Laterality Date  . Knee arthroscopy Left   . Tonsillectomy    . Back surgery    . Robot assisted laparoscopic radical prostatectomy  2009    radiation Tx 2011  . Vasectomy    . Colonoscopy    . Polypectomy    . Atrial ablation surgery      for a fib x 5 years ago  .  Flexible sigmoidoscopy  02/10/2012    Procedure: FLEXIBLE SIGMOIDOSCOPY;  Surgeon: Irene Shipper, MD;  [facility];  Service: Endoscopy;  Laterality: N/A;  need APC   . Prostate surgery    . Video assisted thoracoscopy Left 11/26/2014    Procedure: VIDEO ASSISTED THORACOSCOPY;  Surgeon: Melrose Nakayama, MD;  Location: [place];  Service: Thoracic;  Laterality: Left;  . Resection of mediastinal mass N/A 11/26/2014    Procedure: RESECTION OF ANTERIOR MEDIASTINAL MASS;  Surgeon: Melrose Nakayama, MD;  Location: [place];  Service: Thoracic;  Laterality: N/A;  . Lead removal N/A 11/26/2014    Procedure: CRYO INTERCOSTAL NERVE BLOCK;  Surgeon: Melrose Nakayama, MD;  Location: [place];  Service: Thoracic;  Laterality: N/A;  . Cystoscopy N/A 11/26/2014    Procedure: Erlene Quan;  Surgeon: Bjorn Loser, MD;  Location: [place];  Service: Urology;  Laterality: N/A;  . Cystoscopy with urethral dilatation N/A 11/26/2014    Procedure: CYSTOSCOPY WITH URETHRAL DILATATION WITH INSERTION OF FOLEY;  Surgeon: Bjorn Loser, MD;  Location: [place];  Service: Urology;  Laterality: N/A;    Social History [name] [name]  reports that he has never smoked. He has never used smokeless tobacco. He reports that he does not drink alcohol or use illicit drugs.  family history includes Healthy in his sister and sister; Heart attack in his mother; Heart disease in  his mother; Prostate cancer in his father. There is no history of Colon cancer.  No Known Allergies     PHYSICAL EXAMINATION: Vital signs: BP 102/70 mmHg  Pulse 76  Ht 5' 8.25" (1.734 m)  Wt 178 lb 2 oz (80.797 kg)  BMI 26.87 kg/m2 General: Well-developed, well-nourished, no acute distress HEENT: Sclerae are anicteric,  Abdomen: Not reexamined Psychiatric: alert and oriented x3. Cooperative   ASSESSMENT:  #1. Problems with anorexia and weight loss most likely secondary to prostate cancer study drug (based on timing) and/or  situational depression from multiple medical setbacks post thymoma surgery. Slowly improving   PLAN:  #1. Continue with empiric PPI. He wishes to. He will finish up study drug in February. He may come off PPI that time, or sooner if he wishes. Anticipate that he will continue with slow positive progress. At this point, he may follow up in this office as needed and will resume his general medical care with his primary physician team

## 2015-07-21 ENCOUNTER — Encounter (HOSPITAL_COMMUNITY)
Admission: RE | Admit: 2015-07-21 | Discharge: 2015-07-21 | Disposition: A | Discharge status: Home / Self Care | Payer: Medicare Other | Source: Ambulatory Visit | Attending: Pulmonary Disease | Admitting: Pulmonary Disease

## 2015-07-21 DIAGNOSIS — H524 Presbyopia: Secondary | ICD-10-CM | POA: Diagnosis not present

## 2015-07-21 DIAGNOSIS — H43393 Other vitreous opacities, bilateral: Secondary | ICD-10-CM | POA: Diagnosis not present

## 2015-07-21 DIAGNOSIS — J9612 Chronic respiratory failure with hypercapnia: Secondary | ICD-10-CM | POA: Diagnosis not present

## 2015-07-21 DIAGNOSIS — H5213 Myopia, bilateral: Secondary | ICD-10-CM | POA: Diagnosis not present

## 2015-07-21 DIAGNOSIS — H40013 Open angle with borderline findings, low risk, bilateral: Secondary | ICD-10-CM | POA: Diagnosis not present

## 2015-07-21 NOTE — Progress Notes (Signed)
Today, Zachery exercised at [employer]. Service time was from 10:30am to 12:05pm.  The patient exercised by performing aerobic, strengthening, and stretching exercises. Oxygen saturation, heart rate, blood pressure, rate of perceived exertion, and shortness of breath were all monitored before, during, and after exercise. Regino presented with no problems at today's exercise session.  The patient did have an increase in workload intensity during today's exercise session.  Pre-exercise vitals: . Weight kg: 79.3 . Liters of O2: ra . SpO2: 98 . HR: 75 . BP: 104/50 . CBG: na  Exercise vitals: . Highest heartrate:  96 . Lowest oxygen saturation: 93 . Highest blood pressure: 126/70 . Liters of 02: ra  Post-exercise vitals: . SpO2: 96 . HR: 84 . BP: 100/74 . Liters of O2: ra . CBG: na  Dr. Brand Males, Medical Director Dr. Ree Kida is immediately available during today's Pulmonary Rehab session for [name] on 07/21/15 at 10:30am class time.

## 2015-07-23 ENCOUNTER — Encounter (HOSPITAL_COMMUNITY)
Admission: RE | Admit: 2015-07-23 | Discharge: 2015-07-23 | Disposition: A | Discharge status: Home / Self Care | Payer: Medicare Other | Source: Ambulatory Visit | Attending: Pulmonary Disease | Admitting: Pulmonary Disease

## 2015-07-23 DIAGNOSIS — J9612 Chronic respiratory failure with hypercapnia: Secondary | ICD-10-CM | POA: Diagnosis not present

## 2015-07-23 NOTE — Progress Notes (Signed)
Today, Rondrick exercised at [employer]. Service time was from 10:30am to 12:05pm.  The patient exercised by performing aerobic, strengthening, and stretching exercises. Oxygen saturation, heart rate, blood pressure, rate of perceived exertion, and shortness of breath were all monitored before, during, and after exercise. Jozsef presented with no problems at today's exercise session. Patient attended education today on COPD.   The patient did not have an increase in workload intensity during today's exercise session.  Pre-exercise vitals: . Weight kg: 80.1 . Liters of O2: ra . SpO2: 96 . HR: 75 . BP: 92/60 . CBG: na  Exercise vitals: . Highest heartrate:  104 . Lowest oxygen saturation: 95 . Highest blood pressure: 138/70 . Liters of 02: ra  Post-exercise vitals: . SpO2: 96 . HR: 78 . BP: 104/70 . Liters of O2: ra . CBG: na  Dr. Brand Males, Medical Director Dr. Marily Memos is immediately available during today's Pulmonary Rehab session for [name] on 07/23/15 at 10:30am class time

## 2015-07-28 ENCOUNTER — Encounter (HOSPITAL_COMMUNITY)
Admission: RE | Admit: 2015-07-28 | Discharge: 2015-07-28 | Disposition: A | Discharge status: Home / Self Care | Payer: Medicare Other | Source: Ambulatory Visit | Attending: Pulmonary Disease | Admitting: Pulmonary Disease

## 2015-07-28 ENCOUNTER — Encounter (HOSPITAL_COMMUNITY): Payer: Medicare Other

## 2015-07-28 NOTE — Progress Notes (Signed)
Today, [name] [name] exercised at [employer]. Service time was from 1030 to 1200.  The patient exercised by performing aerobic, strengthening, and stretching exercises. Oxygen saturation, heart rate, blood pressure, rate of perceived exertion, and shortness of breath were all monitored before, during, and after exercise. [name] [name] presented with no problems at today's exercise session.  The patient did not have an increase in workload intensity during today's exercise session.  Pre-exercise vitals: . Weight kg: 80.2 . Liters of O2: ra . SpO2: 97 . HR: 65 . BP: 94/58 . CBG: na  Exercise vitals: . Highest heartrate:  108 . Lowest oxygen saturation: 95 . Highest blood pressure: 104/70 . Liters of 02: ra  Post-exercise vitals: . SpO2: 96 . HR: 81 . BP: 108/60 . Liters of O2: ra . CBG: na  Dr. Brand Males, Medical Director Dr. Marily Memos is immediately available during today's Pulmonary Rehab session for [name] [name] on 07/28/2015 at 1030 class time

## 2015-07-30 ENCOUNTER — Encounter (HOSPITAL_COMMUNITY): Payer: Medicare Other

## 2015-07-30 ENCOUNTER — Encounter (HOSPITAL_COMMUNITY)
Admission: RE | Admit: 2015-07-30 | Discharge: 2015-07-30 | Disposition: A | Discharge status: Home / Self Care | Payer: Medicare Other | Source: Ambulatory Visit | Attending: Pulmonary Disease | Admitting: Pulmonary Disease

## 2015-07-30 DIAGNOSIS — J9612 Chronic respiratory failure with hypercapnia: Secondary | ICD-10-CM | POA: Diagnosis not present

## 2015-07-30 NOTE — Progress Notes (Signed)
Today, Marquavion exercised at [employer] [facility]. Service time was from 1030 to 1215.  The patient exercised by performing aerobic, strengthening, and stretching exercises. Oxygen saturation, heart rate, blood pressure, rate of perceived exertion, and shortness of breath were all monitored before, during, and after exercise. Montre presented with no problems at today's exercise session.Patient attended the Exercise for the Pulmonary Patient class today.  The patient did not have an increase in workload intensity during today's exercise session.  Pre-exercise vitals: . Weight kg: 80.2 . Liters of O2: ra . SpO2: 97 . HR: 65 . BP: 92/50 . CBG: na  Exercise vitals: . Highest heartrate:  106 . Lowest oxygen saturation: 94 . Highest blood pressure: 110/60 . Liters of 02: ra  Post-exercise vitals: . SpO2: 95 . HR: 79 . BP: 90/64 . Liters of O2: ra . CBG: na Dr. Brand Males, Medical Director Dr. Cruzita Lederer is immediately available during today's Pulmonary Rehab session for [name] on 07/30/2015  at 1030 class time.  [name] Kitchen

## 2015-08-04 ENCOUNTER — Encounter (HOSPITAL_COMMUNITY)
Admission: RE | Admit: 2015-08-04 | Discharge: 2015-08-04 | Disposition: A | Discharge status: Home / Self Care | Payer: Medicare Other | Source: Ambulatory Visit | Attending: Pulmonary Disease | Admitting: Pulmonary Disease

## 2015-08-04 DIAGNOSIS — C61 Malignant neoplasm of prostate: Secondary | ICD-10-CM | POA: Diagnosis not present

## 2015-08-04 DIAGNOSIS — J9612 Chronic respiratory failure with hypercapnia: Secondary | ICD-10-CM | POA: Diagnosis not present

## 2015-08-04 NOTE — Progress Notes (Signed)
Today, [name] exercised at [facility] [facility]. Service time was from 1030 to 1215.  The patient exercised by performing aerobic, strengthening, and stretching exercises. Oxygen saturation, heart rate, blood pressure, rate of perceived exertion, and shortness of breath were all monitored before, during, and after exercise. [name] [name] presented with no problems at today's exercise session. He attended oxygen safety class today.  The patient did not have an increase in workload intensity during today's exercise session.  Pre-exercise vitals: . Weight kg: 80.0 . Liters of O2: RA . SpO2: 96 . HR: 62 . BP: 96/60 . CBG: NA  Exercise vitals: . Highest heartrate:  101 . Lowest oxygen saturation: 92 . Highest blood pressure: 114/64 . Liters of 02: RA  Post-exercise vitals: . SpO2: 95 . HR: 71 . BP: 102/64 . Liters of O2: RA . CBG: NA Dr. Brand Males, Medical Director Dr. Tana Coast is immediately available during today's Pulmonary Rehab session for [name] [name] on 08/04/2015  at 1030 class time.  [name] Kitchen

## 2015-08-06 ENCOUNTER — Encounter (HOSPITAL_COMMUNITY): Payer: Medicare Other

## 2015-08-10 ENCOUNTER — Encounter (HOSPITAL_COMMUNITY)
Admission: RE | Admit: 2015-08-10 | Discharge: 2015-08-10 | Disposition: A | Discharge status: Home / Self Care | Payer: Medicare Other | Source: Ambulatory Visit | Attending: Urology | Admitting: Urology

## 2015-08-10 DIAGNOSIS — C61 Malignant neoplasm of prostate: Secondary | ICD-10-CM | POA: Diagnosis not present

## 2015-08-10 MED ORDER — TECHNETIUM TC 99M MEDRONATE IV KIT
23.4000 | PACK | Freq: Once | INTRAVENOUS | Status: AC | PRN
  Administered 2015-08-10: 23.4 via INTRAVENOUS

## 2015-08-11 ENCOUNTER — Encounter (HOSPITAL_COMMUNITY)
Admission: RE | Admit: 2015-08-11 | Discharge: 2015-08-11 | Disposition: A | Discharge status: Home / Self Care | Payer: Medicare Other | Source: Ambulatory Visit | Attending: Pulmonary Disease | Admitting: Pulmonary Disease

## 2015-08-11 DIAGNOSIS — J9612 Chronic respiratory failure with hypercapnia: Secondary | ICD-10-CM | POA: Diagnosis not present

## 2015-08-11 NOTE — Progress Notes (Signed)
Today, [name] exercised at [facility] [facility]. Service time was from 1030 to 1215.  The patient exercised by performing aerobic, strengthening, and stretching exercises. Oxygen saturation, heart rate, blood pressure, rate of perceived exertion, and shortness of breath were all monitored before, during, and after exercise. [name] presented with no problems at today's exercise session.  The patient did  have an increase in workload intensity during today's exercise session.  Pre-exercise vitals: . Weight kg: 79.8 . Liters of O2: RA . SpO2: 98 . HR: 71 . BP: 104/64 . CBG: NA  Exercise vitals: . Highest heartrate:  100 . Lowest oxygen saturation: 94 . Highest blood pressure: 100/60 . Liters of 02: RA  Post-exercise vitals: . SpO2: 94 . HR: 78 . BP: 92/60 . Liters of O2: RA . CBG: NA Dr. Brand Males, Medical Director Dr. Frederic Jericho is immediately available during today's Pulmonary Rehab session for [name] [name] on 08/11/2015  at 1030 class time.  [name] Kitchen

## 2015-08-13 ENCOUNTER — Encounter (HOSPITAL_COMMUNITY)
Admission: RE | Admit: 2015-08-13 | Discharge: 2015-08-13 | Disposition: A | Discharge status: Home / Self Care | Payer: Medicare Other | Source: Ambulatory Visit | Attending: Pulmonary Disease | Admitting: Pulmonary Disease

## 2015-08-13 DIAGNOSIS — J9612 Chronic respiratory failure with hypercapnia: Secondary | ICD-10-CM | POA: Diagnosis not present

## 2015-08-13 NOTE — Progress Notes (Addendum)
Today, Rose exercised at [facility] [facility]. Service time was from 1030 to 1210.  The patient exercised by performing aerobic, strengthening, and stretching exercises. Oxygen saturation, heart rate, blood pressure, rate of perceived exertion, and shortness of breath were all monitored before, during, and after exercise. Drevin presented with no problems at today's exercise session. Striker also attended an education session on warning signs and symptoms.  The patient did not have an increase in workload intensity during today's exercise session.  Pre-exercise vitals: . Weight kg: 80.5 . Liters of O2: ra . SpO2: 98 . HR: 69 . BP: 100/52 . CBG: na  Exercise vitals: . Highest heartrate:  91 . Lowest oxygen saturation: 97 . Highest blood pressure: 106/70 . Liters of 02: ra  Post-exercise vitals: . SpO2: 96 . HR: 71 . BP: 104/56 . Liters of O2: ra . CBG: na  Dr. Rush Farmer, Medical Director Dr. Eliseo Squires is immediately available during today's Pulmonary Rehab session for [name] on 08/13/2015 at 1030 class time

## 2015-08-18 ENCOUNTER — Encounter (HOSPITAL_COMMUNITY)
Admission: RE | Admit: 2015-08-18 | Discharge: 2015-08-18 | Disposition: A | Discharge status: Home / Self Care | Payer: Medicare Other | Source: Ambulatory Visit | Attending: Pulmonary Disease | Admitting: Pulmonary Disease

## 2015-08-18 DIAGNOSIS — J9612 Chronic respiratory failure with hypercapnia: Secondary | ICD-10-CM | POA: Diagnosis not present

## 2015-08-18 NOTE — Progress Notes (Signed)
Today, Keanthony exercised at [employer] [facility]. Service time was from 1030 to 1210.  The patient exercised by performing aerobic, strengthening, and stretching exercises. Oxygen saturation, heart rate, blood pressure, rate of perceived exertion, and shortness of breath were all monitored before, during, and after exercise. Atthew presented with no problems at today's exercise session.  The patient did not have an increase in workload intensity during today's exercise session.  Pre-exercise vitals: . Weight kg: 80.7 . Liters of O2: ra . SpO2: 94 . HR: 64 . BP: 102/68 . CBG: na  Exercise vitals: . Highest heartrate:  111 . Lowest oxygen saturation: 94 . Highest blood pressure: 142/70 . Liters of 02: ra  Post-exercise vitals: . SpO2: 93 . HR: 73 . BP: 106/60 . Liters of O2: ra . CBG: na Dr. Rush Farmer, Medical Director Dr. Marily Memos is immediately available during today's Pulmonary Rehab session for [name] on 08/18/2015  at 1030 class time  .

## 2015-08-20 ENCOUNTER — Encounter (HOSPITAL_COMMUNITY)
Admission: RE | Admit: 2015-08-20 | Discharge: 2015-08-20 | Disposition: A | Discharge status: Home / Self Care | Payer: Medicare Other | Source: Ambulatory Visit | Attending: Pulmonary Disease | Admitting: Pulmonary Disease

## 2015-08-20 ENCOUNTER — Encounter (HOSPITAL_COMMUNITY): Payer: Medicare Other

## 2015-08-20 DIAGNOSIS — J9612 Chronic respiratory failure with hypercapnia: Secondary | ICD-10-CM | POA: Diagnosis not present

## 2015-08-20 NOTE — Progress Notes (Signed)
Today, [name] [name] exercised at [employer] [facility]. Service time was from 10:30am to 12:15pm.  The patient exercised by performing aerobic, strengthening, and stretching exercises. Oxygen saturation, heart rate, blood pressure, rate of perceived exertion, and shortness of breath were all monitored before, during, and after exercise. [name] [name] presented with no problems at today's exercise session. The patient attended education today with Trish Fountain on Anatomy and Physiology.  The patient did not have an increase in workload intensity during today's exercise session.  Pre-exercise vitals: . Weight kg: 80.8 . Liters of O2: ra . SpO2: 97 . HR: 65 . BP: 100/72 . CBG: na  Exercise vitals: . Highest heartrate:  108 . Lowest oxygen saturation: 91 . Highest blood pressure: 132/80 . Liters of 02: ra  Post-exercise vitals: . SpO2: 94 . HR: 75 . BP: 114/64 . Liters of O2: ra . CBG: na  Dr. Rush Farmer, Medical Director Dr. Frederic Jericho is immediately available during today's Pulmonary Rehab session for [name] [name] on 08/20/15 at 10:30am class time.

## 2015-08-25 ENCOUNTER — Encounter (HOSPITAL_COMMUNITY)
Admission: RE | Admit: 2015-08-25 | Discharge: 2015-08-25 | Disposition: A | Discharge status: Home / Self Care | Payer: Medicare Other | Source: Ambulatory Visit | Attending: Pulmonary Disease | Admitting: Pulmonary Disease

## 2015-08-25 DIAGNOSIS — J9612 Chronic respiratory failure with hypercapnia: Secondary | ICD-10-CM | POA: Diagnosis not present

## 2015-08-25 NOTE — Progress Notes (Signed)
Today, Azi exercised at [employer]. Service time was from 10:30am to 12:00pm.  The patient exercised by performing aerobic, strengthening, and stretching exercises. Oxygen saturation, heart rate, blood pressure, rate of perceived exertion, and shortness of breath were all monitored before, during, and after exercise. [name] [name] presented with no problems at today's exercise session.  The patient did not have an increase in workload intensity during today's exercise session.  Pre-exercise vitals: . Weight kg: 80.7 . Liters of O2: ra . SpO2: 98 . HR: 63 . BP: 96/54 . CBG: na  Exercise vitals: . Highest heartrate:  129 . Lowest oxygen saturation: 90 . Highest blood pressure: 110/60 . Liters of 02: ra  Post-exercise vitals: . SpO2: 92 . HR: 75 . BP: 96/74 . Liters of O2: ra . CBG: na  Dr. Rush Farmer, Medical Director Dr. Frederic Jericho is immediately available during today's Pulmonary Rehab session for [name] [name] on 08/25/15 at 10:30am class time.

## 2015-08-27 ENCOUNTER — Encounter (HOSPITAL_COMMUNITY)
Admission: RE | Admit: 2015-08-27 | Discharge: 2015-08-27 | Disposition: A | Discharge status: Home / Self Care | Payer: Medicare Other | Source: Ambulatory Visit | Attending: Pulmonary Disease | Admitting: Pulmonary Disease

## 2015-08-27 DIAGNOSIS — J9612 Chronic respiratory failure with hypercapnia: Secondary | ICD-10-CM | POA: Diagnosis not present

## 2015-08-27 NOTE — Progress Notes (Signed)
Today, [name] [name] exercised at [employer]. Service time was from 10:30am to 12:15pm.  The patient exercised by performing aerobic, strengthening, and stretching exercises. Oxygen saturation, heart rate, blood pressure, rate of perceived exertion, and shortness of breath were all monitored before, during, and after exercise. [name] [name] presented with no problems at today's exercise session.  The patient attended education today with [name] [name] on Advanced Directives.  The patient did not have an increase in workload intensity during today's exercise session.  Pre-exercise vitals: . Weight kg: 80.9 . Liters of O2: ra . SpO2: 97 . HR: 67 . BP: 100/70 . CBG: na  Exercise vitals: . Highest heartrate:  114 . Lowest oxygen saturation: 92 . Highest blood pressure: 116/64 . Liters of 02: ra  Post-exercise vitals: . SpO2: 91 . HR: 79 . BP: 100/62 . Liters of O2: ra . CBG: na  Dr. Rush [name], Medical Director Dr. Frederic [name] is immediately available during today's Pulmonary Rehab session for [name] [name] on 08/27/15 at 10:30am class time.

## 2015-08-31 DIAGNOSIS — R490 Dysphonia: Secondary | ICD-10-CM | POA: Diagnosis not present

## 2015-09-01 ENCOUNTER — Encounter (HOSPITAL_COMMUNITY)
Admission: RE | Admit: 2015-09-01 | Discharge: 2015-09-01 | Disposition: A | Discharge status: Home / Self Care | Payer: Medicare Other | Source: Ambulatory Visit | Attending: Pulmonary Disease | Admitting: Pulmonary Disease

## 2015-09-01 DIAGNOSIS — J9612 Chronic respiratory failure with hypercapnia: Secondary | ICD-10-CM | POA: Diagnosis not present

## 2015-09-01 NOTE — Progress Notes (Signed)
[name] [name] completed a Six-Minute Walk Test on 09/01/15 . [name] [name] walked 1661 feet with 0 breaks.  The patient's lowest oxygen saturation was 96 %, highest heart rate was 102 bpm , and highest blood pressure was 108/70. The patient was on room air. Patient stated that nothing hindered their walk test.

## 2015-09-03 ENCOUNTER — Encounter (HOSPITAL_COMMUNITY): Payer: Medicare Other

## 2015-09-08 ENCOUNTER — Encounter (HOSPITAL_COMMUNITY): Payer: Medicare Other

## 2015-09-08 NOTE — Progress Notes (Signed)
Pulmonary Rehab Discharge Note: [name] [name] has been discharged from pulmonary rehab after successfully completing 23 exercise and education sessions. [name] [name] increased his stamina and strength while in the program as evidenced by his ability to walk an additional 731 feet on his discharge walk test as compared to his initial. This was one of [name] [name]'s personal goals that he met. [name] [name] also met his goal of redeveloping his ability to be active and play with his grandchildren again. [name] [name] lost most of his stamina and strength post surgery from a complication. He was unable to work because of his voice loss. 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 He has regained his voice. He plans to continue to exercise at [facility] daily as his schedule allows.

## 2015-09-10 ENCOUNTER — Encounter (HOSPITAL_COMMUNITY): Payer: Medicare Other

## 2015-09-15 ENCOUNTER — Encounter (HOSPITAL_COMMUNITY): Payer: Medicare Other

## 2015-09-17 ENCOUNTER — Encounter (HOSPITAL_COMMUNITY): Payer: Medicare Other

## 2015-09-22 ENCOUNTER — Encounter (HOSPITAL_COMMUNITY): Payer: Medicare Other

## 2015-09-24 ENCOUNTER — Encounter (HOSPITAL_COMMUNITY): Payer: Medicare Other

## 2015-09-29 ENCOUNTER — Encounter (HOSPITAL_COMMUNITY): Payer: Medicare Other

## 2015-10-01 ENCOUNTER — Encounter (HOSPITAL_COMMUNITY): Payer: Medicare Other

## 2015-10-06 ENCOUNTER — Encounter (HOSPITAL_COMMUNITY): Payer: Medicare Other

## 2015-10-07 ENCOUNTER — Encounter: Payer: Self-pay | Admitting: Physician Assistant

## 2015-10-07 DIAGNOSIS — J3800 Paralysis of vocal cords and larynx, unspecified: Secondary | ICD-10-CM | POA: Insufficient documentation

## 2015-10-08 ENCOUNTER — Encounter (HOSPITAL_COMMUNITY): Payer: Medicare Other

## 2015-10-13 ENCOUNTER — Encounter (HOSPITAL_COMMUNITY): Payer: Medicare Other

## 2015-10-15 ENCOUNTER — Encounter (HOSPITAL_COMMUNITY): Payer: Medicare Other

## 2015-10-20 ENCOUNTER — Encounter (HOSPITAL_COMMUNITY): Payer: Medicare Other

## 2015-10-20 ENCOUNTER — Encounter: Payer: Medicare Other | Admitting: Thoracic Surgery (Cardiothoracic Vascular Surgery)

## 2015-10-22 ENCOUNTER — Encounter (HOSPITAL_COMMUNITY): Payer: Medicare Other

## 2015-10-27 ENCOUNTER — Encounter (HOSPITAL_COMMUNITY): Payer: Medicare Other

## 2015-10-28 DIAGNOSIS — L570 Actinic keratosis: Secondary | ICD-10-CM | POA: Diagnosis not present

## 2015-10-28 DIAGNOSIS — L820 Inflamed seborrheic keratosis: Secondary | ICD-10-CM | POA: Diagnosis not present

## 2015-10-28 DIAGNOSIS — L578 Other skin changes due to chronic exposure to nonionizing radiation: Secondary | ICD-10-CM | POA: Diagnosis not present

## 2015-10-28 DIAGNOSIS — L821 Other seborrheic keratosis: Secondary | ICD-10-CM | POA: Diagnosis not present

## 2015-10-29 ENCOUNTER — Encounter (HOSPITAL_COMMUNITY): Payer: Medicare Other

## 2015-10-30 ENCOUNTER — Other Ambulatory Visit: Payer: Self-pay | Admitting: Thoracic Surgery (Cardiothoracic Vascular Surgery)

## 2015-10-30 DIAGNOSIS — E328 Other diseases of thymus: Secondary | ICD-10-CM

## 2015-11-02 DIAGNOSIS — R490 Dysphonia: Secondary | ICD-10-CM | POA: Diagnosis not present

## 2015-11-02 DIAGNOSIS — D150 Benign neoplasm of thymus: Secondary | ICD-10-CM | POA: Diagnosis not present

## 2015-11-02 DIAGNOSIS — J3801 Paralysis of vocal cords and larynx, unilateral: Secondary | ICD-10-CM | POA: Diagnosis not present

## 2015-11-02 DIAGNOSIS — J986 Disorders of diaphragm: Secondary | ICD-10-CM | POA: Diagnosis not present

## 2015-11-03 ENCOUNTER — Ambulatory Visit
Admission: RE | Admit: 2015-11-03 | Discharge: 2015-11-03 | Disposition: A | Discharge status: Home / Self Care | Payer: Medicare Other | Source: Ambulatory Visit | Attending: Thoracic Surgery (Cardiothoracic Vascular Surgery) | Admitting: Thoracic Surgery (Cardiothoracic Vascular Surgery)

## 2015-11-03 ENCOUNTER — Ambulatory Visit (INDEPENDENT_AMBULATORY_CARE_PROVIDER_SITE_OTHER): Payer: Medicare Other | Admitting: Thoracic Surgery (Cardiothoracic Vascular Surgery)

## 2015-11-03 ENCOUNTER — Encounter: Payer: Self-pay | Admitting: Thoracic Surgery (Cardiothoracic Vascular Surgery)

## 2015-11-03 ENCOUNTER — Encounter (HOSPITAL_COMMUNITY): Payer: Medicare Other

## 2015-11-03 VITALS — BP 110/73 | HR 64 | Resp 16 | Ht 69.5 in | Wt 175.0 lb

## 2015-11-03 DIAGNOSIS — Z9089 Acquired absence of other organs: Secondary | ICD-10-CM | POA: Diagnosis not present

## 2015-11-03 DIAGNOSIS — E328 Other diseases of thymus: Secondary | ICD-10-CM | POA: Diagnosis not present

## 2015-11-03 NOTE — Progress Notes (Signed)
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  As a 75 year old man who underwent a thymectomy via left VATS approach in March 2016. 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 He initially was having severe problems with fatigue and hoarseness. He noticed a rather dramatic improvement back in the fall around that time he started pulmonary rehabilitation. His progress has leveled off.  He saw ENT at [facility] yesterday. They did a vocal cord examination. 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  He completed the pulmonary rehabilitation program. 6 minute walk test was 1661 feet without any desaturations.  He sees Dr. Alinda Money tomorrow regarding his prostate cancer. He had a CT back in November which showed resolution of the pelvic side wall mass.  Past Medical History  Diagnosis Date  . Atrial fibrillation ([place])   . Colon polyps     hyperplastic and tubular adenomatous  . Thrombocytopenia ([place])   . Radiation     for prostate cancer  . Neuromuscular disorder ([place])   . Prostate cancer ([place])   . Erectile dysfunction following radical prostatectomy   . Mediastinal mass     per CT CHEST 11/07/14 @ [facility].[name] KitchenANTERIOR...4.2 X 3.3 cm soft tissue  . Dysrhythmia     hx at fib- ablation 8 yrs ago  . Radiation proctitis     rectum  . Diverticulosis       Current Outpatient Prescriptions  Medication Sig Dispense Refill  . acetaminophen (TYLENOL) 325 MG tablet Take 650 mg by mouth every 4 (four) hours as needed (pain).     . Calcium Carb-Cholecalciferol (CALCIUM PLUS VITAMIN D3) 600-800 MG-UNIT TABS Take 2 tablets by mouth daily.     . Investigational - Study Medication Take 4 tablets by mouth 1 day or 1 dose. Study program for [facility]    . metoprolol tartrate (LOPRESSOR) 25 MG  tablet Take 0.5 tablets (12.5 mg total) by mouth 2 (two) times daily. 90 tablet 3  . omeprazole (PRILOSEC) 40 MG capsule Take 1 capsule (40 mg total) by mouth daily. 30 capsule 6   No current facility-administered medications for this visit.    Physical Exam BP 110/73 mmHg  Pulse 64  Resp 16  Ht 5' 9.5" (1.765 m)  Wt 175 lb (79.379 kg)  BMI 25.48 kg/m2  SpO88 40% 75 year old man in no acute distress No hoarseness Cardiac regular rate and rhythm normal S1 and S2 Lungs clear with equal breath sounds bilaterally  Diagnostic Tests: I personally reviewed his chest x-ray. There is still slight elevation of left hemidiaphragm.  Impression: 75 year old man who is now almost a year out from a thymectomy. He continues to have issues with fatigue and lack of energy.   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  He had a left hemidiaphragm paralysis postop as well. He had a sniff test to [facility] which showed normal movement of the left hemidiaphragm. His chest x-ray still shows the left side is slightly elevated.  His appetite has improved but he still was not able to eat breakfast  Plan: Return in 2 months. We will plan to do a CT of the chest at that time for his one-year  follow-up.  Melrose Nakayama, MD [facility] [phone]

## 2015-11-05 ENCOUNTER — Other Ambulatory Visit (HOSPITAL_COMMUNITY): Payer: Self-pay | Admitting: Urology

## 2015-11-05 ENCOUNTER — Encounter (HOSPITAL_COMMUNITY): Payer: Medicare Other

## 2015-11-05 DIAGNOSIS — C61 Malignant neoplasm of prostate: Secondary | ICD-10-CM

## 2015-11-06 ENCOUNTER — Encounter: Payer: Self-pay | Admitting: Cardiology

## 2015-11-06 ENCOUNTER — Encounter: Payer: Self-pay | Admitting: *Deleted

## 2015-11-06 ENCOUNTER — Ambulatory Visit (INDEPENDENT_AMBULATORY_CARE_PROVIDER_SITE_OTHER): Payer: Medicare Other | Admitting: Cardiology

## 2015-11-06 VITALS — BP 110/66 | HR 55 | Ht 70.0 in | Wt 182.4 lb

## 2015-11-06 DIAGNOSIS — I480 Paroxysmal atrial fibrillation: Secondary | ICD-10-CM

## 2015-11-06 DIAGNOSIS — J986 Disorders of diaphragm: Secondary | ICD-10-CM

## 2015-11-06 HISTORY — DX: Disorders of diaphragm: J98.6

## 2015-11-06 NOTE — Progress Notes (Signed)
Cardiology Office Note   Date:  11/06/2015   ID:  [name] [name], [name] [name], DOB [birth year], MRN [id number]  PCP:  Kennon Portela, MD  Cardiologist: Darlin Coco MD  Chief Complaint  Patient presents with  . scheduled follow up    a fib      History of Present Illness: [name] [name], [name] [name] is a 75 y.o. 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 He was admitted 11/26/14 for video-assisted thoracoscopy with resection of an anterior mediastinal mass. On post op day #1, he went into AFib with RVR. The patient has a past history of atrial fibrillation. He had had 3 prior cardioversions and had been on long-term Coumadin. Eight years ago he saw Dr. Ola [name] at [facility] and underwent an ablation of his atrial fibrillation. He had had an excellent response and he had no recurrent atrial fibrillation until the postop episode. In the hospital, he was treated with IV Dilt for rate control. He was also placed on Amiodarone. His initial ECG was suggestive of pericarditis. He was treated with Ibuprofen x 1 week. He converted to NSR. CHADS2-VASc=1 (age > 74). His echo demonstrated normal LVF. His amiodarone was discontinued on 01/06/15 when he was seen by Dr. Roxan [name] in follow-up. . 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  He has improved however He has had no recurrent atrial fibrillation since we last saw him 6 months ago.  He remains on low-dose metoprolol tartrate 12.5 mg twice a day which he is tolerating without side effects.   Past Medical History  Diagnosis Date  . Atrial fibrillation ([place])   . Colon polyps     hyperplastic and tubular adenomatous  . Thrombocytopenia ([place])   . Radiation     for prostate cancer    . Neuromuscular disorder ([place])   . Prostate cancer ([place])   . Erectile dysfunction following radical prostatectomy   . Mediastinal mass     per CT CHEST 11/07/14 @ [facility].[name] KitchenANTERIOR...4.2 X 3.3 cm soft tissue  . Dysrhythmia     hx at fib- ablation 8 yrs ago  . Radiation proctitis     rectum  . Diverticulosis     Past Surgical History  Procedure Laterality Date  . Knee arthroscopy Left   . Tonsillectomy    . Back surgery    . Robot assisted laparoscopic radical prostatectomy  2009    radiation Tx 2011  . Vasectomy    . Colonoscopy    . Polypectomy    . Atrial ablation surgery      for a fib x 5 years ago  . Flexible sigmoidoscopy  02/10/2012    Procedure: FLEXIBLE SIGMOIDOSCOPY;  Surgeon: Irene Shipper, MD;  Location: [facility];  Service: Endoscopy;  Laterality: N/A;  need APC   . Prostate surgery    . Video assisted thoracoscopy Left 11/26/2014    Procedure: VIDEO ASSISTED THORACOSCOPY;  Surgeon: Melrose Nakayama, MD;  Location: [facility];  Service: Thoracic;  Laterality: Left;  . Resection of mediastinal mass N/A 11/26/2014    Procedure: RESECTION OF ANTERIOR MEDIASTINAL MASS;  Surgeon: Melrose Nakayama, MD;  Location: [place];  Service: Thoracic;  Laterality: N/A;  . Lead removal N/A 11/26/2014    Procedure: CRYO INTERCOSTAL  NERVE BLOCK;  Surgeon: Melrose Nakayama, MD;  Location: [facility];  Service: Thoracic;  Laterality: N/A;  . Cystoscopy N/A 11/26/2014    Procedure: Erlene Quan;  Surgeon: Bjorn Loser, MD;  Location: [place] [facility];  Service: Urology;  Laterality: N/A;  . Cystoscopy with urethral dilatation N/A 11/26/2014    Procedure: CYSTOSCOPY WITH URETHRAL DILATATION WITH INSERTION OF FOLEY;  Surgeon: Bjorn Loser, MD;  Location: [place];  Service: Urology;  Laterality: N/A;     Current Outpatient Prescriptions  Medication Sig Dispense Refill  . acetaminophen (TYLENOL) 325 MG tablet Take 650 mg by mouth every 4 (four) hours as needed  (pain).     . Calcium Carb-Cholecalciferol (CALCIUM PLUS VITAMIN D3) 600-800 MG-UNIT TABS Take 2 tablets by mouth daily.     . metoprolol tartrate (LOPRESSOR) 25 MG tablet Take 0.5 tablets (12.5 mg total) by mouth 2 (two) times daily. 90 tablet 3  . omeprazole (PRILOSEC) 40 MG capsule Take 1 capsule (40 mg total) by mouth daily. 30 capsule 6   No current facility-administered medications for this visit.    Allergies:   Review of patient's allergies indicates no known allergies.    Social History:  The patient  reports that he has never smoked. He has never used smokeless tobacco. He reports that he does not drink alcohol or use illicit drugs.   Family History:  The patient's family history includes Healthy in his sister and sister; Heart attack in his mother; Heart disease in his mother; Prostate cancer in his father. There is no history of Colon cancer.    ROS:  Please see the history of present illness.   Otherwise, review of systems are positive for none.   All other systems are reviewed and negative.    PHYSICAL EXAM: VS:  BP 110/66 mmHg  Pulse 55  Ht 5\' 10"  (1.778 m)  Wt 182 lb 6.4 oz (82.736 kg)  BMI 26.17 kg/m2 , BMI Body mass index is 26.17 kg/(m^2). GEN: Well nourished, well developed, in no acute distress HEENT: normal Neck: no JVD, carotid bruits, or masses Cardiac: RRR; no murmurs, rubs, or gallops,no edema  Respiratory:  clear to auscultation bilaterally, normal work of breathing GI: soft, nontender, nondistended, + BS MS: no deformity or atrophy Skin: warm and dry, no rash Neuro:  Strength and sensation are intact Psych: euthymic mood, full affect   EKG:  EKG is ordered today. The ekg ordered today demonstrates sinus bradycardia at 55 bpm.  Otherwise normal EKG.   Recent Labs: 11/28/2014: ALT 15; BUN 15; Creatinine, Ser 0.84; Hemoglobin 12.5*; Platelets 163; Potassium 3.8; Sodium 135 01/06/2015: TSH 2.405    Lipid Panel    Component Value Date/Time   CHOL  197 08/15/2013 1026   TRIG 98 08/15/2013 1026   HDL 40 08/15/2013 1026   CHOLHDL 4.9 08/15/2013 1026   VLDL 20 08/15/2013 1026   LDLCALC 137* 08/15/2013 1026      Wt Readings from Last 3 Encounters:  11/06/15 182 lb 6.4 oz (82.736 kg)  11/03/15 175 lb (79.379 kg)  07/16/15 178 lb 2 oz (80.797 kg)        ASSESSMENT AND PLAN: 1. Paroxysmal atrial fibrillation, status post ablation 8 years ago. Maintaining normal sinus rhythm off amiodarone. CHADSSVASC score of 1 for age. Not on anticoagulation 2.  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  Dr. Roxan [name]. 3.  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 4.  History of prostate  cancer  Current medicines are reviewed at length with the patient today.  The patient does not have concerns regarding medicines.  The following changes have been made:  no change  Labs/ tests ordered today include:  No orders of the defined types were placed in this encounter.     Disposition:  From a cardiac standpoint the patient is doing well.  We refilled his metoprolol today.  He has had no recurrent atrial fibrillation.  He will follow-up in 6 months for office visit and EKG with Dr. Stanford Breed  Signed, Darlin Coco MD 11/06/2015 10:[address], [place], [place]  [address] Phone: [phone]; Fax: [phone]

## 2015-11-06 NOTE — Patient Instructions (Addendum)
Medication Instructions:  Your physician recommends that you continue on your current medications as directed. Please refer to the Current Medication list given to you today.  Labwork: nne  Testing/Procedures: none  Follow-Up: Your physician wants you to follow-up in: 6 months with Dr Stanford Breed at the [facility] office You will receive a reminder letter in the mail two months in advance. If you don't receive a letter, please call our office to schedule the follow-up appointment.  If you need a refill on your cardiac medications before your next appointment, please call your pharmacy.

## 2015-11-10 ENCOUNTER — Encounter (HOSPITAL_COMMUNITY): Payer: Medicare Other

## 2015-11-12 ENCOUNTER — Encounter (HOSPITAL_COMMUNITY): Payer: Medicare Other

## 2015-11-17 ENCOUNTER — Encounter (HOSPITAL_COMMUNITY): Payer: Medicare Other

## 2015-11-17 ENCOUNTER — Telehealth (HOSPITAL_COMMUNITY): Payer: Self-pay | Admitting: *Deleted

## 2015-11-19 ENCOUNTER — Encounter (HOSPITAL_COMMUNITY): Payer: Medicare Other

## 2015-11-22 ENCOUNTER — Encounter: Payer: Self-pay | Admitting: Physician Assistant

## 2015-11-22 DIAGNOSIS — R490 Dysphonia: Secondary | ICD-10-CM | POA: Insufficient documentation

## 2015-11-22 DIAGNOSIS — C61 Malignant neoplasm of prostate: Secondary | ICD-10-CM

## 2015-11-25 ENCOUNTER — Ambulatory Visit: Payer: Medicare Other | Admitting: Nurse Practitioner

## 2015-11-26 ENCOUNTER — Encounter (HOSPITAL_COMMUNITY)
Admission: RE | Admit: 2015-11-26 | Discharge: 2015-11-26 | Disposition: A | Discharge status: Home / Self Care | Payer: Medicare Other | Source: Ambulatory Visit | Attending: Thoracic Surgery (Cardiothoracic Vascular Surgery) | Admitting: Thoracic Surgery (Cardiothoracic Vascular Surgery)

## 2015-11-26 DIAGNOSIS — J9612 Chronic respiratory failure with hypercapnia: Secondary | ICD-10-CM | POA: Insufficient documentation

## 2015-11-26 NOTE — Progress Notes (Signed)
[name] [name] started Pulmonary Maintenance Program today. He tolerated exercise well without c/o. [name] [name] plans to attend 3 months.

## 2015-12-01 ENCOUNTER — Encounter (HOSPITAL_COMMUNITY)
Admission: RE | Admit: 2015-12-01 | Discharge: 2015-12-01 | Disposition: A | Discharge status: Home / Self Care | Payer: Self-pay | Source: Ambulatory Visit | Attending: Pulmonary Disease | Admitting: Pulmonary Disease

## 2015-12-03 ENCOUNTER — Encounter (HOSPITAL_COMMUNITY)
Admission: RE | Admit: 2015-12-03 | Discharge: 2015-12-03 | Disposition: A | Discharge status: Home / Self Care | Payer: Self-pay | Source: Ambulatory Visit | Attending: Pulmonary Disease | Admitting: Pulmonary Disease

## 2015-12-04 ENCOUNTER — Other Ambulatory Visit: Payer: Self-pay | Admitting: Thoracic Surgery (Cardiothoracic Vascular Surgery)

## 2015-12-04 DIAGNOSIS — E328 Other diseases of thymus: Secondary | ICD-10-CM

## 2015-12-04 DIAGNOSIS — J986 Disorders of diaphragm: Secondary | ICD-10-CM

## 2015-12-04 DIAGNOSIS — J9859 Other diseases of mediastinum, not elsewhere classified: Secondary | ICD-10-CM

## 2015-12-08 ENCOUNTER — Encounter (HOSPITAL_COMMUNITY)
Admission: RE | Admit: 2015-12-08 | Discharge: 2015-12-08 | Disposition: A | Discharge status: Home / Self Care | Payer: Self-pay | Source: Ambulatory Visit | Attending: Pulmonary Disease | Admitting: Pulmonary Disease

## 2015-12-10 ENCOUNTER — Encounter (HOSPITAL_COMMUNITY): Payer: Medicare Other

## 2015-12-15 ENCOUNTER — Encounter (HOSPITAL_COMMUNITY): Payer: Medicare Other

## 2015-12-15 DIAGNOSIS — J9612 Chronic respiratory failure with hypercapnia: Secondary | ICD-10-CM | POA: Insufficient documentation

## 2015-12-17 ENCOUNTER — Encounter (HOSPITAL_COMMUNITY)
Admission: RE | Admit: 2015-12-17 | Discharge: 2015-12-17 | Disposition: A | Discharge status: Home / Self Care | Payer: Self-pay | Source: Ambulatory Visit | Attending: Pulmonary Disease | Admitting: Pulmonary Disease

## 2015-12-22 ENCOUNTER — Encounter (HOSPITAL_COMMUNITY)
Admission: RE | Admit: 2015-12-22 | Discharge: 2015-12-22 | Disposition: A | Discharge status: Home / Self Care | Payer: Self-pay | Source: Ambulatory Visit | Attending: Pulmonary Disease | Admitting: Pulmonary Disease

## 2015-12-22 ENCOUNTER — Encounter: Payer: Self-pay | Admitting: Thoracic Surgery (Cardiothoracic Vascular Surgery)

## 2015-12-22 ENCOUNTER — Ambulatory Visit (INDEPENDENT_AMBULATORY_CARE_PROVIDER_SITE_OTHER): Payer: Medicare Other | Admitting: Thoracic Surgery (Cardiothoracic Vascular Surgery)

## 2015-12-22 ENCOUNTER — Ambulatory Visit
Admission: RE | Admit: 2015-12-22 | Discharge: 2015-12-22 | Disposition: A | Discharge status: Home / Self Care | Payer: Medicare Other | Source: Ambulatory Visit | Attending: Thoracic Surgery (Cardiothoracic Vascular Surgery) | Admitting: Thoracic Surgery (Cardiothoracic Vascular Surgery)

## 2015-12-22 VITALS — BP 98/66 | HR 68 | Resp 18 | Ht 70.0 in | Wt 181.0 lb

## 2015-12-22 DIAGNOSIS — Z9089 Acquired absence of other organs: Secondary | ICD-10-CM | POA: Diagnosis not present

## 2015-12-22 DIAGNOSIS — J3800 Paralysis of vocal cords and larynx, unspecified: Secondary | ICD-10-CM

## 2015-12-22 DIAGNOSIS — R05 Cough: Secondary | ICD-10-CM | POA: Diagnosis not present

## 2015-12-22 DIAGNOSIS — J986 Disorders of diaphragm: Secondary | ICD-10-CM

## 2015-12-22 DIAGNOSIS — E328 Other diseases of thymus: Secondary | ICD-10-CM | POA: Diagnosis not present

## 2015-12-22 DIAGNOSIS — J9859 Other diseases of mediastinum, not elsewhere classified: Secondary | ICD-10-CM

## 2015-12-22 NOTE — Progress Notes (Signed)
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  He is a 75 year old man who underwent a thymectomy via left VATS approach in March 2016. 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 He initially was having severe problems with fatigue and hoarseness. He noticed a rather dramatic improvement back in the fall around that time he started pulmonary rehabilitation. His progress then leveled off.  He saw ENT at [facility] yesterday. They did a vocal cord examination. 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 He completed the pulmonary rehabilitation program. 6 minute walk test was 1661 feet without any desaturations.  When I saw him in November he was in relatively upbeat mood and felt like he was making progress. But at his last visit he was very discouraged. Today he says that he has seen some improvement since his last visit, but that he still feels like he is at less than 50% of his baseline capacity. Of note he is no longer on the study drug for prostate cancer (antiandrogen medication).  Past Medical History  Diagnosis Date  . Atrial fibrillation ([place])   . Colon polyps     hyperplastic and tubular adenomatous  . Thrombocytopenia ([place])   . Radiation     for prostate cancer  . Neuromuscular disorder ([place])   . Prostate cancer ([place])   . Erectile dysfunction following radical prostatectomy   . Mediastinal mass     per CT CHEST 11/07/14 @ [facility].[name] KitchenANTERIOR...4.2 X 3.3 cm soft tissue  . Dysrhythmia     hx at fib- ablation 8 yrs ago  . Radiation proctitis     rectum  . Diverticulosis      Current Outpatient Prescriptions  Medication Sig Dispense Refill  . acetaminophen (TYLENOL) 325 MG tablet Take 650 mg by mouth every 4 (four) hours as needed (pain).     . Calcium  Carb-Cholecalciferol (CALCIUM PLUS VITAMIN D3) 600-800 MG-UNIT TABS Take 2 tablets by mouth daily.     . metoprolol tartrate (LOPRESSOR) 25 MG tablet Take 0.5 tablets (12.5 mg total) by mouth 2 (two) times daily. 90 tablet 3   No current facility-administered medications for this visit.    Physical Exam BP 98/66 mmHg  Pulse 68  Resp 18  Ht 5\' 10"  (1.778 m)  Wt 181 lb (82.101 kg)  BMI 25.97 kg/m2  SpO35 55% 75 year old man in no acute distress Alert and oriented 3 with no focal neurologic deficits Hoarse voice Lungs diminished at left base, otherwise clear Cardiac regular rate and rhythm normal S1 and S2  Diagnostic Tests: I personally reviewed his CT chest. Unfortunately the radiology reading is not available due to server issues. There is minimal residual soft tissue in the anterior mediastinum. Nothing to suggest thymic pathology.  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 There is no evidence of recurrence.   He continues to have issues related to weak voice and generalized fatigue. He continues to do cardiac and pulmonary rehabilitation exercises. He has noted some improvement since his last visit, but is not nearly back to his baseline. I suspect that the antiandrogen drug is contributing to this significantly. There  also is probably a contribution from the elevation of left hemidiaphragm however his symptoms, particularly of fatigue and lack of energy, seem out of proportion to his diaphragm issues. Hopefully he will continue to see some improvement in both areas over time.  Plan: Return in 3 months with PA and lateral chest x-ray  Melrose Nakayama, MD [facility] [phone]

## 2015-12-24 ENCOUNTER — Encounter (HOSPITAL_COMMUNITY)
Admission: RE | Admit: 2015-12-24 | Discharge: 2015-12-24 | Disposition: A | Discharge status: Home / Self Care | Payer: Self-pay | Source: Ambulatory Visit | Attending: Pulmonary Disease | Admitting: Pulmonary Disease

## 2015-12-29 ENCOUNTER — Encounter (HOSPITAL_COMMUNITY)
Admission: RE | Admit: 2015-12-29 | Discharge: 2015-12-29 | Disposition: A | Discharge status: Home / Self Care | Payer: Self-pay | Source: Ambulatory Visit | Attending: Pulmonary Disease | Admitting: Pulmonary Disease

## 2015-12-29 DIAGNOSIS — R490 Dysphonia: Secondary | ICD-10-CM | POA: Diagnosis not present

## 2015-12-31 ENCOUNTER — Encounter (HOSPITAL_COMMUNITY): Payer: Medicare Other

## 2016-01-03 ENCOUNTER — Encounter: Payer: Self-pay | Admitting: Physician Assistant

## 2016-01-03 DIAGNOSIS — H905 Unspecified sensorineural hearing loss: Secondary | ICD-10-CM

## 2016-01-05 ENCOUNTER — Encounter (HOSPITAL_COMMUNITY): Payer: Medicare Other

## 2016-01-07 ENCOUNTER — Encounter (HOSPITAL_COMMUNITY): Payer: Medicare Other

## 2016-01-12 ENCOUNTER — Encounter (HOSPITAL_COMMUNITY)
Admission: RE | Admit: 2016-01-12 | Discharge: 2016-01-12 | Disposition: A | Discharge status: Home / Self Care | Payer: Self-pay | Source: Ambulatory Visit | Attending: Pulmonary Disease | Admitting: Pulmonary Disease

## 2016-01-12 DIAGNOSIS — J9612 Chronic respiratory failure with hypercapnia: Secondary | ICD-10-CM | POA: Insufficient documentation

## 2016-01-14 ENCOUNTER — Encounter (HOSPITAL_COMMUNITY)
Admission: RE | Admit: 2016-01-14 | Discharge: 2016-01-14 | Disposition: A | Discharge status: Home / Self Care | Payer: Self-pay | Source: Ambulatory Visit | Attending: Pulmonary Disease | Admitting: Pulmonary Disease

## 2016-01-19 ENCOUNTER — Encounter (HOSPITAL_COMMUNITY)
Admission: RE | Admit: 2016-01-19 | Discharge: 2016-01-19 | Disposition: A | Discharge status: Home / Self Care | Payer: Self-pay | Source: Ambulatory Visit | Attending: Pulmonary Disease | Admitting: Pulmonary Disease

## 2016-01-21 ENCOUNTER — Encounter (HOSPITAL_COMMUNITY)
Admission: RE | Admit: 2016-01-21 | Discharge: 2016-01-21 | Disposition: A | Discharge status: Home / Self Care | Payer: Self-pay | Source: Ambulatory Visit | Attending: Pulmonary Disease | Admitting: Pulmonary Disease

## 2016-01-21 DIAGNOSIS — Z6827 Body mass index (BMI) 27.0-27.9, adult: Secondary | ICD-10-CM | POA: Diagnosis not present

## 2016-01-21 DIAGNOSIS — M25512 Pain in left shoulder: Secondary | ICD-10-CM | POA: Diagnosis not present

## 2016-01-21 DIAGNOSIS — M25519 Pain in unspecified shoulder: Secondary | ICD-10-CM | POA: Insufficient documentation

## 2016-01-21 DIAGNOSIS — C61 Malignant neoplasm of prostate: Secondary | ICD-10-CM | POA: Diagnosis not present

## 2016-01-21 DIAGNOSIS — M25511 Pain in right shoulder: Secondary | ICD-10-CM | POA: Diagnosis not present

## 2016-01-22 ENCOUNTER — Encounter (HOSPITAL_COMMUNITY)
Admission: RE | Admit: 2016-01-22 | Discharge: 2016-01-22 | Disposition: A | Discharge status: Home / Self Care | Payer: Medicare Other | Source: Ambulatory Visit | Attending: Urology | Admitting: Urology

## 2016-01-22 DIAGNOSIS — C61 Malignant neoplasm of prostate: Secondary | ICD-10-CM | POA: Insufficient documentation

## 2016-01-22 MED ORDER — TECHNETIUM TC 99M MEDRONATE IV KIT
26.0000 | PACK | Freq: Once | INTRAVENOUS | Status: AC | PRN
  Administered 2016-01-22: 26 via INTRAVENOUS

## 2016-01-26 ENCOUNTER — Encounter (HOSPITAL_COMMUNITY)
Admission: RE | Admit: 2016-01-26 | Discharge: 2016-01-26 | Disposition: A | Discharge status: Home / Self Care | Payer: Self-pay | Source: Ambulatory Visit | Attending: Pulmonary Disease | Admitting: Pulmonary Disease

## 2016-01-28 ENCOUNTER — Encounter (HOSPITAL_COMMUNITY)
Admission: RE | Admit: 2016-01-28 | Discharge: 2016-01-28 | Disposition: A | Discharge status: Home / Self Care | Payer: Self-pay | Source: Ambulatory Visit | Attending: Pulmonary Disease | Admitting: Pulmonary Disease

## 2016-02-01 DIAGNOSIS — R490 Dysphonia: Secondary | ICD-10-CM | POA: Diagnosis not present

## 2016-02-02 ENCOUNTER — Encounter (HOSPITAL_COMMUNITY)
Admission: RE | Admit: 2016-02-02 | Discharge: 2016-02-02 | Disposition: A | Discharge status: Home / Self Care | Payer: Self-pay | Source: Ambulatory Visit | Attending: Pulmonary Disease | Admitting: Pulmonary Disease

## 2016-02-02 ENCOUNTER — Telehealth: Payer: Self-pay | Admitting: Cardiology

## 2016-02-02 NOTE — Telephone Encounter (Signed)
Continue metoprolol for now; we will discuss at next ov. Kirk Ruths

## 2016-02-02 NOTE — Telephone Encounter (Signed)
Due to see Dr. Stanford Breed 8/31 as former patient of [facility].   Had discuss with Dr. Mare Ferrari regarding stopping Metoprolol for Afib.   I have not had any symptom in a year.  Need to know if it is ok to stop.

## 2016-02-02 NOTE — Telephone Encounter (Signed)
Spoke with pt, Aware of dr crenshaw's recommendations.  °

## 2016-02-02 NOTE — Telephone Encounter (Signed)
Will forward for dr crenshaw review  

## 2016-02-03 ENCOUNTER — Ambulatory Visit
Admission: RE | Admit: 2016-02-03 | Discharge: 2016-02-03 | Disposition: A | Discharge status: Home / Self Care | Payer: Medicare Other | Source: Ambulatory Visit | Attending: Orthopedic Surgery | Admitting: Orthopedic Surgery

## 2016-02-03 ENCOUNTER — Other Ambulatory Visit: Payer: Self-pay | Admitting: Orthopedic Surgery

## 2016-02-03 DIAGNOSIS — M25512 Pain in left shoulder: Principal | ICD-10-CM

## 2016-02-03 DIAGNOSIS — M25511 Pain in right shoulder: Secondary | ICD-10-CM

## 2016-02-03 DIAGNOSIS — M19011 Primary osteoarthritis, right shoulder: Secondary | ICD-10-CM | POA: Diagnosis not present

## 2016-02-03 DIAGNOSIS — M19012 Primary osteoarthritis, left shoulder: Secondary | ICD-10-CM | POA: Diagnosis not present

## 2016-02-04 ENCOUNTER — Encounter (HOSPITAL_COMMUNITY)
Admission: RE | Admit: 2016-02-04 | Discharge: 2016-02-04 | Disposition: A | Discharge status: Home / Self Care | Payer: Self-pay | Source: Ambulatory Visit | Attending: Pulmonary Disease | Admitting: Pulmonary Disease

## 2016-02-09 ENCOUNTER — Encounter (HOSPITAL_COMMUNITY)
Admission: RE | Admit: 2016-02-09 | Discharge: 2016-02-09 | Disposition: A | Discharge status: Home / Self Care | Payer: Self-pay | Source: Ambulatory Visit | Attending: Pulmonary Disease | Admitting: Pulmonary Disease

## 2016-02-11 ENCOUNTER — Encounter (HOSPITAL_COMMUNITY)
Admission: RE | Admit: 2016-02-11 | Discharge: 2016-02-11 | Disposition: A | Discharge status: Home / Self Care | Payer: Self-pay | Source: Ambulatory Visit | Attending: Pulmonary Disease | Admitting: Pulmonary Disease

## 2016-02-11 DIAGNOSIS — J9612 Chronic respiratory failure with hypercapnia: Secondary | ICD-10-CM | POA: Insufficient documentation

## 2016-02-12 DIAGNOSIS — H40019 Open angle with borderline findings, low risk, unspecified eye: Secondary | ICD-10-CM | POA: Diagnosis not present

## 2016-02-12 DIAGNOSIS — H25819 Combined forms of age-related cataract, unspecified eye: Secondary | ICD-10-CM | POA: Diagnosis not present

## 2016-02-16 ENCOUNTER — Encounter (HOSPITAL_COMMUNITY)
Admission: RE | Admit: 2016-02-16 | Discharge: 2016-02-16 | Disposition: A | Discharge status: Home / Self Care | Payer: Self-pay | Source: Ambulatory Visit | Attending: Pulmonary Disease | Admitting: Pulmonary Disease

## 2016-02-18 ENCOUNTER — Encounter (HOSPITAL_COMMUNITY): Payer: Medicare Other

## 2016-02-18 DIAGNOSIS — R350 Frequency of micturition: Secondary | ICD-10-CM | POA: Diagnosis not present

## 2016-02-23 ENCOUNTER — Encounter (HOSPITAL_COMMUNITY)
Admission: RE | Admit: 2016-02-23 | Discharge: 2016-02-23 | Disposition: A | Discharge status: Home / Self Care | Payer: Self-pay | Source: Ambulatory Visit | Attending: Pulmonary Disease | Admitting: Pulmonary Disease

## 2016-02-23 ENCOUNTER — Encounter: Payer: Self-pay | Admitting: Physician Assistant

## 2016-02-25 ENCOUNTER — Encounter (HOSPITAL_COMMUNITY): Payer: Medicare Other

## 2016-03-01 ENCOUNTER — Encounter (HOSPITAL_COMMUNITY)
Admission: RE | Admit: 2016-03-01 | Discharge: 2016-03-01 | Disposition: A | Discharge status: Home / Self Care | Payer: Self-pay | Source: Ambulatory Visit | Attending: Pulmonary Disease | Admitting: Pulmonary Disease

## 2016-03-03 ENCOUNTER — Encounter (HOSPITAL_COMMUNITY)
Admission: RE | Admit: 2016-03-03 | Discharge: 2016-03-03 | Disposition: A | Discharge status: Home / Self Care | Payer: Self-pay | Source: Ambulatory Visit | Attending: Pulmonary Disease | Admitting: Pulmonary Disease

## 2016-03-08 ENCOUNTER — Encounter (HOSPITAL_COMMUNITY): Payer: Medicare Other

## 2016-03-08 ENCOUNTER — Other Ambulatory Visit (HOSPITAL_COMMUNITY): Payer: Self-pay | Admitting: Urology

## 2016-03-08 DIAGNOSIS — C61 Malignant neoplasm of prostate: Secondary | ICD-10-CM

## 2016-03-10 ENCOUNTER — Encounter (HOSPITAL_COMMUNITY): Payer: Medicare Other

## 2016-03-15 ENCOUNTER — Encounter (HOSPITAL_COMMUNITY): Payer: Medicare Other

## 2016-03-15 DIAGNOSIS — J9612 Chronic respiratory failure with hypercapnia: Secondary | ICD-10-CM | POA: Insufficient documentation

## 2016-03-17 ENCOUNTER — Encounter (HOSPITAL_COMMUNITY)
Admission: RE | Admit: 2016-03-17 | Discharge: 2016-03-17 | Disposition: A | Discharge status: Home / Self Care | Payer: Self-pay | Source: Ambulatory Visit | Attending: Pulmonary Disease | Admitting: Pulmonary Disease

## 2016-03-21 ENCOUNTER — Other Ambulatory Visit: Payer: Self-pay | Admitting: Thoracic Surgery (Cardiothoracic Vascular Surgery)

## 2016-03-21 DIAGNOSIS — E328 Other diseases of thymus: Secondary | ICD-10-CM

## 2016-03-22 ENCOUNTER — Ambulatory Visit (INDEPENDENT_AMBULATORY_CARE_PROVIDER_SITE_OTHER): Payer: Medicare Other | Admitting: Thoracic Surgery (Cardiothoracic Vascular Surgery)

## 2016-03-22 ENCOUNTER — Encounter: Payer: Medicare Other | Admitting: Thoracic Surgery (Cardiothoracic Vascular Surgery)

## 2016-03-22 ENCOUNTER — Encounter: Payer: Self-pay | Admitting: Thoracic Surgery (Cardiothoracic Vascular Surgery)

## 2016-03-22 ENCOUNTER — Ambulatory Visit
Admission: RE | Admit: 2016-03-22 | Discharge: 2016-03-22 | Disposition: A | Discharge status: Home / Self Care | Payer: Medicare Other | Source: Ambulatory Visit | Attending: Cardiothoracic Surgery | Admitting: Cardiothoracic Surgery

## 2016-03-22 ENCOUNTER — Encounter (HOSPITAL_COMMUNITY): Payer: Medicare Other

## 2016-03-22 VITALS — BP 98/65 | HR 66 | Resp 16 | Ht 70.0 in | Wt 182.0 lb

## 2016-03-22 DIAGNOSIS — Z9089 Acquired absence of other organs: Secondary | ICD-10-CM | POA: Diagnosis not present

## 2016-03-22 DIAGNOSIS — E328 Other diseases of thymus: Secondary | ICD-10-CM | POA: Diagnosis not present

## 2016-03-22 DIAGNOSIS — R0602 Shortness of breath: Secondary | ICD-10-CM | POA: Diagnosis not present

## 2016-03-22 NOTE — Progress Notes (Signed)
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  Jhael is a 75 year old man who underwent a thymectomy via left VATS approach in March 2016. 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 He initially was having severe problems with fatigue and hoarseness. He noticed a rather dramatic improvement back in the fall around the time he started pulmonary rehabilitation. His progress then leveled off. I saw him in February and he was better than he had been his prior visit, but felt like he was about 40% of normal.  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 He was working about 3 days a week prior to surgery.  He completed the pulmonary rehabilitation program. 6 minute walk test was 1661 feet without any desaturations.  He is now off the prostate cancer study medication.  He is continued to make some slow progress since I last saw him. He feels like he is now at about 60% of his baseline. He has started playing golf again, but can't really walk the course and is having to use a cart. He says that he does tend to wear down over the course of the day.  Past Medical History  Diagnosis Date  . Atrial fibrillation ([place])   . Colon polyps     hyperplastic and tubular adenomatous  . Thrombocytopenia ([place])   . Radiation     for prostate cancer  . Neuromuscular disorder ([place])   . Prostate cancer ([place])   . Erectile dysfunction following radical prostatectomy   . Mediastinal mass     per CT CHEST 11/07/14 @ [facility].[name] KitchenANTERIOR...4.2 X 3.3 cm soft tissue  . Dysrhythmia     hx at fib- ablation 8 yrs ago  . Radiation proctitis     rectum  . Diverticulosis       Current Outpatient Prescriptions  Medication Sig Dispense Refill  .  acetaminophen (TYLENOL) 325 MG tablet Take 650 mg by mouth every 4 (four) hours as needed (pain).     . Calcium Carb-Cholecalciferol (CALCIUM PLUS VITAMIN D3) 600-800 MG-UNIT TABS Take 2 tablets by mouth daily.     . metoprolol tartrate (LOPRESSOR) 25 MG tablet Take 0.5 tablets (12.5 mg total) by mouth 2 (two) times daily. 90 tablet 3   No current facility-administered medications for this visit.    Physical Exam BP 98/65 mmHg  Pulse 66  Resp 16  Ht 5\' 10"  (1.778 m)  Wt 182 lb (82.555 kg)  BMI 26.11 kg/m2  SpO24 50% 75 year old man in no acute distress Alert and oriented 3 no focal deficits Very slightly diminished breath sounds at left base but overall good air movement, no wheezing  Diagnostic Tests: CHEST 2 VIEW  COMPARISON: 11/03/2015  FINDINGS: Heart and mediastinal contours are within normal limits. No focal opacities or effusions. No acute bony abnormality.  IMPRESSION: No active cardiopulmonary disease.   Electronically Signed  By: Rolm Baptise M.D.  On: 03/22/2016 10:09  I personally reviewed the chest x-ray. I do feel the left hemidiaphragm is still elevated compared to the right. It may be slightly improved from his last chest x-ray.  Impression: 75 year old gentleman who had a thoracoscopic thymectomy. This  was complicated by recurrent and phrenic nerve injuries. His right vocal cord is compensating and his voice is pretty close to normal. His exercise tolerance is improving and he is walking on a regular basis, but is still not back to his baseline. He says that he has a difficult time with stairs and inclines.  In reviewing his chest x-ray think the left hemidiaphragm is still elevated. On his CT in June he could see some atelectasis in the left base. He might benefit from plication of the diaphragm but is hard to predict how much he would benefit. He does not want to consider that at this point and I am not trying to push it on him, but just wanted  him to know that is an option.  Plan: Return in 6 months with PA and lateral chest x-ray.  He can call anytime if he has concerns and needs to be seen sooner.  Melrose Nakayama, MD [facility] [phone]

## 2016-03-24 ENCOUNTER — Encounter (HOSPITAL_COMMUNITY): Payer: Medicare Other

## 2016-03-29 ENCOUNTER — Encounter (HOSPITAL_COMMUNITY): Payer: Medicare Other

## 2016-03-31 ENCOUNTER — Encounter (HOSPITAL_COMMUNITY): Payer: Medicare Other

## 2016-04-05 ENCOUNTER — Encounter (HOSPITAL_COMMUNITY): Payer: Medicare Other

## 2016-04-06 DIAGNOSIS — J3801 Paralysis of vocal cords and larynx, unilateral: Secondary | ICD-10-CM | POA: Diagnosis not present

## 2016-04-06 DIAGNOSIS — R490 Dysphonia: Secondary | ICD-10-CM | POA: Diagnosis not present

## 2016-04-07 ENCOUNTER — Encounter (HOSPITAL_COMMUNITY)
Admission: RE | Admit: 2016-04-07 | Discharge: 2016-04-07 | Disposition: A | Discharge status: Home / Self Care | Payer: Self-pay | Source: Ambulatory Visit | Attending: Pulmonary Disease | Admitting: Pulmonary Disease

## 2016-04-12 ENCOUNTER — Encounter (HOSPITAL_COMMUNITY): Payer: Medicare Other

## 2016-04-12 NOTE — Progress Notes (Signed)
[name] [name] has decided to exercise at the [facility]. He is being discharged from the Pulmonary Maintenance Program.

## 2016-04-14 ENCOUNTER — Encounter (HOSPITAL_COMMUNITY): Payer: Medicare Other

## 2016-04-19 ENCOUNTER — Encounter (HOSPITAL_COMMUNITY): Payer: Medicare Other

## 2016-04-21 ENCOUNTER — Encounter (HOSPITAL_COMMUNITY): Payer: Medicare Other

## 2016-04-26 ENCOUNTER — Encounter (HOSPITAL_COMMUNITY): Payer: Medicare Other

## 2016-04-28 ENCOUNTER — Encounter: Payer: Self-pay | Admitting: Cardiology

## 2016-04-28 ENCOUNTER — Encounter (HOSPITAL_COMMUNITY): Payer: Medicare Other

## 2016-05-03 ENCOUNTER — Encounter (HOSPITAL_COMMUNITY): Payer: Medicare Other

## 2016-05-05 ENCOUNTER — Encounter (HOSPITAL_COMMUNITY): Payer: Medicare Other

## 2016-05-10 ENCOUNTER — Encounter (HOSPITAL_COMMUNITY): Payer: Medicare Other

## 2016-05-10 NOTE — Progress Notes (Signed)
A     HPI: FU atrial fibrillation; previously followed by Dr Mare Ferrari. Pt with h/o AFib s/p ablation, thrombocytopenia, neuromuscular d/o, prostate CA and resection of mediastinal mass.He has had 3 prior cardioversions and had been on long-term Coumadin.Previously had atrial fibrillation ablation at [facility] had had an excellent response and he had no recurrent atrial fibrillation until 4/16 postoperatively following mediastinal mass resection.Nuclear study 2005 with EF 48 and no ischemia or infarction. Last echo 3/16 showed normal LV function. Since last seen, He has some dyspnea on exertion but no orthopnea, PND, pedal edema, palpitations, syncope or chest pain.  Current Outpatient Prescriptions  Medication Sig Dispense Refill  . Ibuprofen (ADVIL PO) Take 2 tablets by mouth 3 times/day as needed-between meals & bedtime.    . metoprolol tartrate (LOPRESSOR) 25 MG tablet Take 0.5 tablets (12.5 mg total) by mouth 2 (two) times daily. 90 tablet 3   No current facility-administered medications for this visit.      Past Medical History:  Diagnosis Date  . Atrial fibrillation ([place])   . Colon polyps    hyperplastic and tubular adenomatous  . Diverticulosis   . Dysrhythmia    hx at fib- ablation 8 yrs ago  . Erectile dysfunction following radical prostatectomy   . Mediastinal mass    per CT CHEST 11/07/14 @ [facility].[name] KitchenANTERIOR...4.2 X 3.3 cm soft tissue  . Neuromuscular disorder ([place])   . Prostate cancer ([place])   . Radiation    for prostate cancer  . Radiation proctitis    rectum  . Thrombocytopenia ([place])     Past Surgical History:  Procedure Laterality Date  . ATRIAL ABLATION SURGERY     for a fib x 5 years ago  . BACK SURGERY    . COLONOSCOPY    . CYSTOSCOPY N/A 11/26/2014   Procedure: CYSTOSCOPY FLEXIBLE;  Surgeon: Bjorn Loser, MD;  Location: [place];  Service: Urology;  Laterality: N/A;  . CYSTOSCOPY WITH URETHRAL DILATATION N/A 11/26/2014   Procedure: CYSTOSCOPY WITH URETHRAL DILATATION WITH INSERTION OF FOLEY;  Surgeon: Bjorn Loser, MD;  Location: [facility];  Service: Urology;  Laterality: N/A;  . FLEXIBLE SIGMOIDOSCOPY  02/10/2012   Procedure: FLEXIBLE SIGMOIDOSCOPY;  Surgeon: Irene Shipper, MD;  Location: [facility];  Service: Endoscopy;  Laterality: N/A;  need APC   . KNEE ARTHROSCOPY Left   . LEAD REMOVAL N/A 11/26/2014   Procedure: CRYO INTERCOSTAL NERVE BLOCK;  Surgeon: Melrose Nakayama, MD;  Location: [facility];  Service: Thoracic;  Laterality: N/A;  . POLYPECTOMY    . PROSTATE SURGERY    . RESECTION OF MEDIASTINAL MASS N/A 11/26/2014   Procedure: RESECTION OF ANTERIOR MEDIASTINAL MASS;  Surgeon: Melrose Nakayama, MD;  Location: [facility];  Service: Thoracic;  Laterality: N/A;  . ROBOT ASSISTED LAPAROSCOPIC RADICAL PROSTATECTOMY  2009   radiation Tx 2011  . TONSILLECTOMY    . VASECTOMY    . VIDEO ASSISTED THORACOSCOPY Left 11/26/2014   Procedure: VIDEO ASSISTED THORACOSCOPY;  Surgeon: Melrose Nakayama, MD;  Location: [facility];  Service: Thoracic;  Laterality: Left;    Social History   Social History  . Marital status: Married    Spouse name: N/A  . Number of children: 4  . Years of education: N/A   Occupational History  . orhodontist    Social History Main Topics  . Smoking status: Never Smoker  . Smokeless tobacco: Never Used  . Alcohol use No  . Drug use: No  . Sexual  activity: No   Other Topics Concern  . Not on file   Social History Narrative  . No narrative on file    Family History  Problem Relation Age of Onset  . Prostate cancer Father   . Heart disease Mother   . Heart attack Mother   . Healthy Sister   . Healthy Sister   . Colon cancer Neg Hx     ROS: no fevers or chills, productive cough, hemoptysis, dysphasia, odynophagia, melena, hematochezia, dysuria, hematuria, rash, seizure activity, orthopnea, PND, pedal edema, claudication. Remaining systems are negative.  Physical  Exam: Well-developed well-nourished in no acute distress.  Skin is warm and dry.  HEENT is normal.  Neck is supple.  Chest is clear to auscultation with normal expansion.  Cardiovascular exam is regular rate and rhythm.  Abdominal exam nontender or distended. No masses palpated. Extremities show no edema. neuro grossly intact  ECG Sinus bradycardia at a rate of 53. No ST changes.  A/P  1 Paroxysmal atrial fibrillation-he has had previous ablation. He remains in sinus rhythm. Anticoagulation was discontinued previously. His only embolic risk factor is age greater than 33. He would like to remain off of anticoagulation. He feels the metoprolol is not allowing him to increase his heart rate with activities which could be contributing to dyspnea on exertion. He would like to discontinue which I think is reasonable. We will follow for any recurrence. Approximately 20 minutes spent reviewing records prior to patient arrival.  2 status post previous resection of thymoma.  3 prostate cancer  Kirk Ruths, MD

## 2016-05-12 ENCOUNTER — Encounter (HOSPITAL_COMMUNITY): Payer: Medicare Other

## 2016-05-12 ENCOUNTER — Ambulatory Visit (INDEPENDENT_AMBULATORY_CARE_PROVIDER_SITE_OTHER): Payer: Medicare Other | Admitting: Cardiology

## 2016-05-12 ENCOUNTER — Encounter: Payer: Self-pay | Admitting: Cardiology

## 2016-05-12 VITALS — BP 106/78 | HR 53 | Ht 70.0 in | Wt 196.0 lb

## 2016-05-12 DIAGNOSIS — I480 Paroxysmal atrial fibrillation: Secondary | ICD-10-CM

## 2016-05-12 NOTE — Addendum Note (Signed)
Addended by: Vanessa Ralphs on: 05/12/2016 09:21 AM   Modules accepted: Orders

## 2016-05-12 NOTE — Patient Instructions (Signed)
Medication Instructions:  Your physician has recommended you make the following change in your medication:  1- STOP TAKING YOUR Metoprolol.   Follow-Up: Your physician wants you to follow-up in: [place]. You will receive a reminder letter in the mail two months in advance. If you don't receive a letter, please call our office to schedule the follow-up appointment.   If you need a refill on your cardiac medications before your next appointment, please call your pharmacy.

## 2016-05-17 ENCOUNTER — Encounter (HOSPITAL_COMMUNITY): Payer: Medicare Other

## 2016-05-19 ENCOUNTER — Encounter (HOSPITAL_COMMUNITY): Payer: Medicare Other

## 2016-05-23 DIAGNOSIS — J3801 Paralysis of vocal cords and larynx, unilateral: Secondary | ICD-10-CM | POA: Diagnosis not present

## 2016-05-23 DIAGNOSIS — R490 Dysphonia: Secondary | ICD-10-CM | POA: Diagnosis not present

## 2016-05-24 ENCOUNTER — Encounter (HOSPITAL_COMMUNITY): Payer: Medicare Other

## 2016-05-26 ENCOUNTER — Encounter (HOSPITAL_COMMUNITY): Payer: Medicare Other

## 2016-05-31 ENCOUNTER — Encounter (HOSPITAL_COMMUNITY): Payer: Medicare Other

## 2016-06-02 ENCOUNTER — Encounter (HOSPITAL_COMMUNITY): Payer: Medicare Other

## 2016-06-07 ENCOUNTER — Encounter (HOSPITAL_COMMUNITY): Payer: Medicare Other

## 2016-06-08 DIAGNOSIS — R490 Dysphonia: Secondary | ICD-10-CM | POA: Diagnosis not present

## 2016-06-08 DIAGNOSIS — J3801 Paralysis of vocal cords and larynx, unilateral: Secondary | ICD-10-CM | POA: Diagnosis not present

## 2016-06-09 ENCOUNTER — Encounter (HOSPITAL_COMMUNITY): Payer: Medicare Other

## 2016-06-14 ENCOUNTER — Encounter (HOSPITAL_COMMUNITY): Payer: Medicare Other

## 2016-06-16 ENCOUNTER — Encounter (HOSPITAL_COMMUNITY): Payer: Medicare Other

## 2016-06-21 ENCOUNTER — Encounter (HOSPITAL_COMMUNITY): Payer: Medicare Other

## 2016-06-30 DIAGNOSIS — C61 Malignant neoplasm of prostate: Secondary | ICD-10-CM | POA: Diagnosis not present

## 2016-07-06 ENCOUNTER — Encounter (HOSPITAL_COMMUNITY)
Admission: RE | Admit: 2016-07-06 | Discharge: 2016-07-06 | Disposition: A | Discharge status: Home / Self Care | Payer: Medicare Other | Source: Ambulatory Visit | Attending: Urology | Admitting: Urology

## 2016-07-06 ENCOUNTER — Ambulatory Visit (HOSPITAL_COMMUNITY)
Admission: RE | Admit: 2016-07-06 | Discharge: 2016-07-06 | Disposition: A | Discharge status: Home / Self Care | Payer: Medicare Other | Source: Ambulatory Visit | Attending: Urology | Admitting: Urology

## 2016-07-06 DIAGNOSIS — C61 Malignant neoplasm of prostate: Secondary | ICD-10-CM

## 2016-07-06 MED ORDER — TECHNETIUM TC 99M MEDRONATE IV KIT
20.3000 | PACK | Freq: Once | INTRAVENOUS | Status: AC | PRN
  Administered 2016-07-06: 20.3 via INTRAVENOUS

## 2016-08-15 DIAGNOSIS — H5213 Myopia, bilateral: Secondary | ICD-10-CM | POA: Diagnosis not present

## 2016-08-15 DIAGNOSIS — H524 Presbyopia: Secondary | ICD-10-CM | POA: Diagnosis not present

## 2016-08-15 DIAGNOSIS — H40013 Open angle with borderline findings, low risk, bilateral: Secondary | ICD-10-CM | POA: Diagnosis not present

## 2016-09-15 IMAGING — CR DG CHEST 2V
2 series · 2 of 2 positions shown · non-contrast
Comparison: None.

CLINICAL DATA: Prior surgery.  Shortness of breath .

EXAM:
CHEST  2 VIEW

[w chest pa]
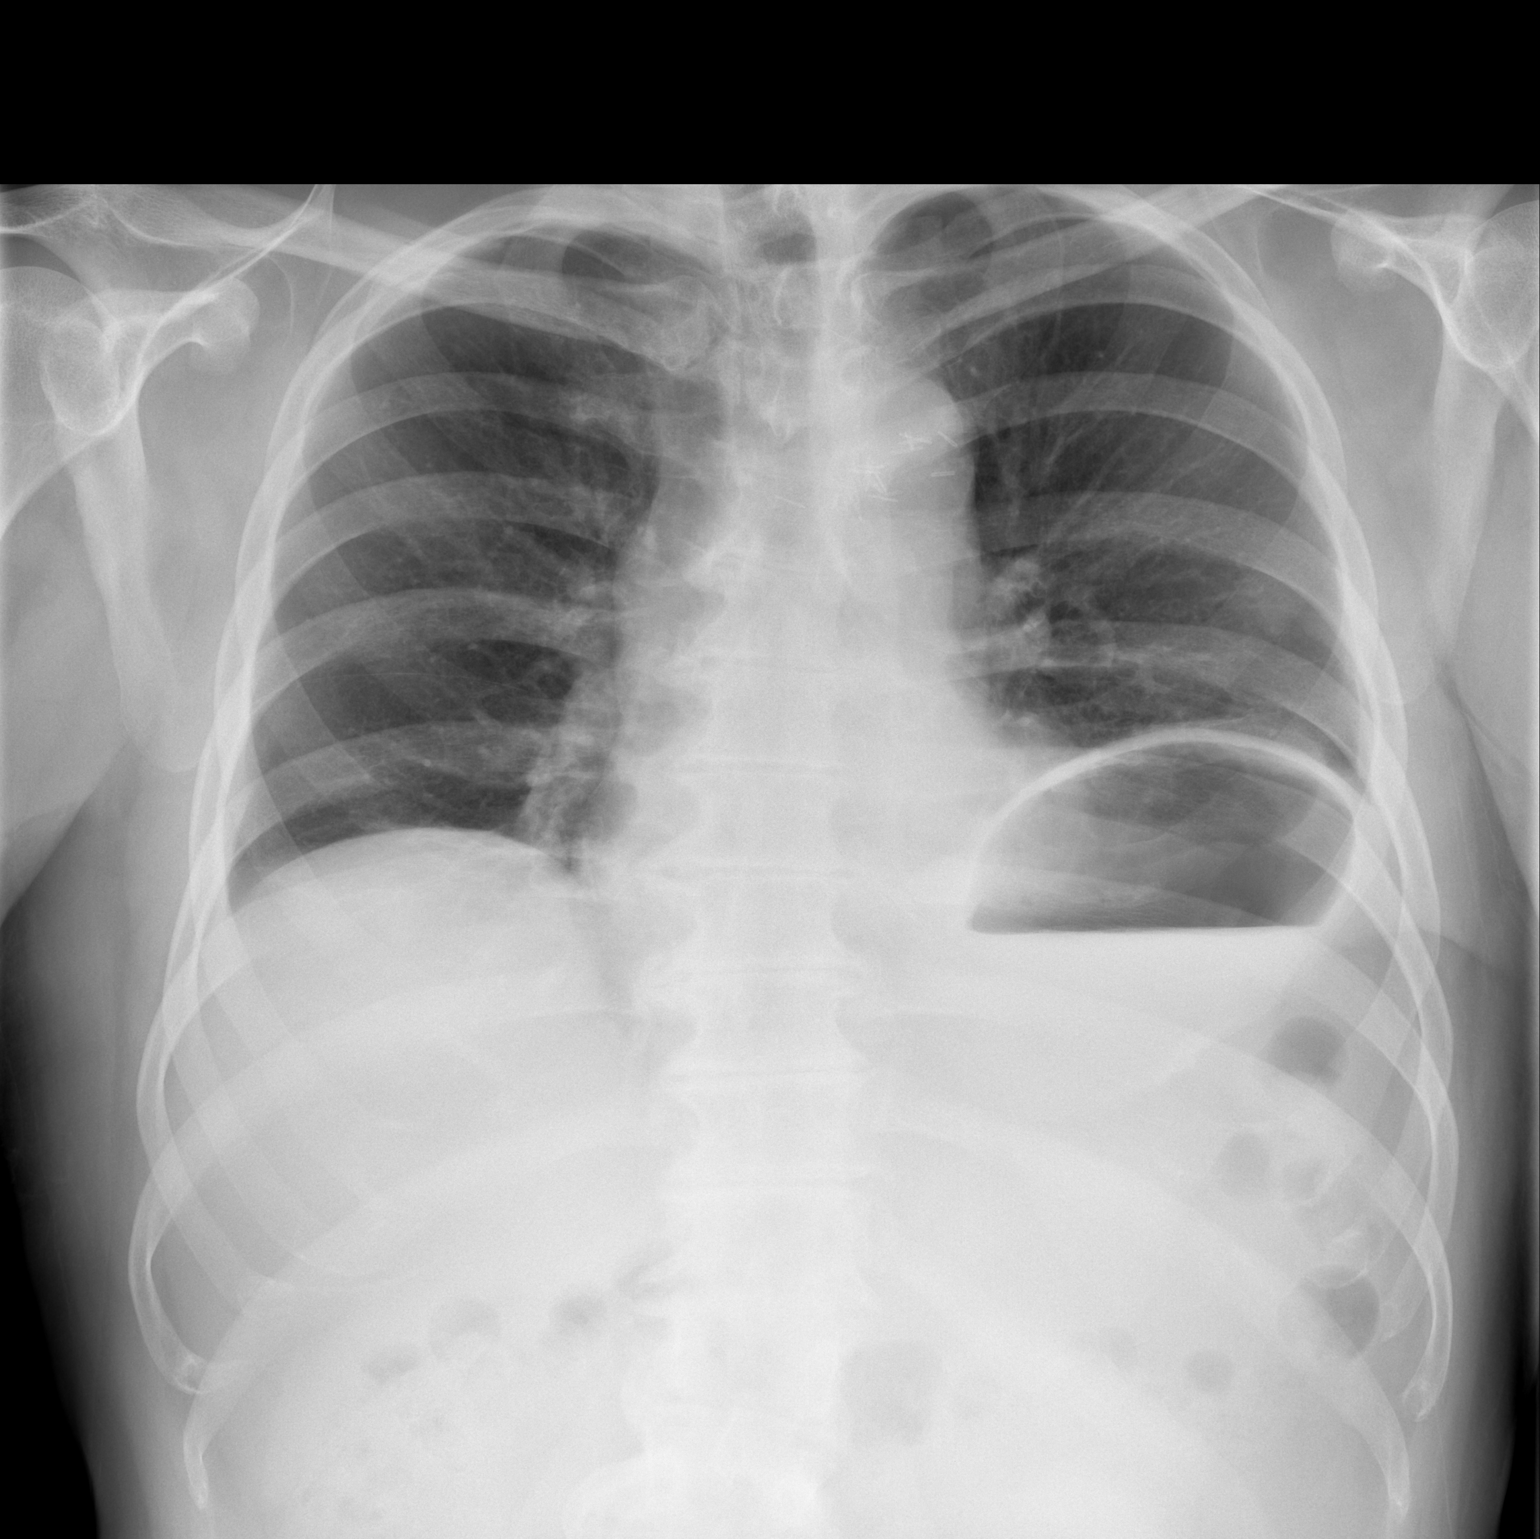

[w chest lat]
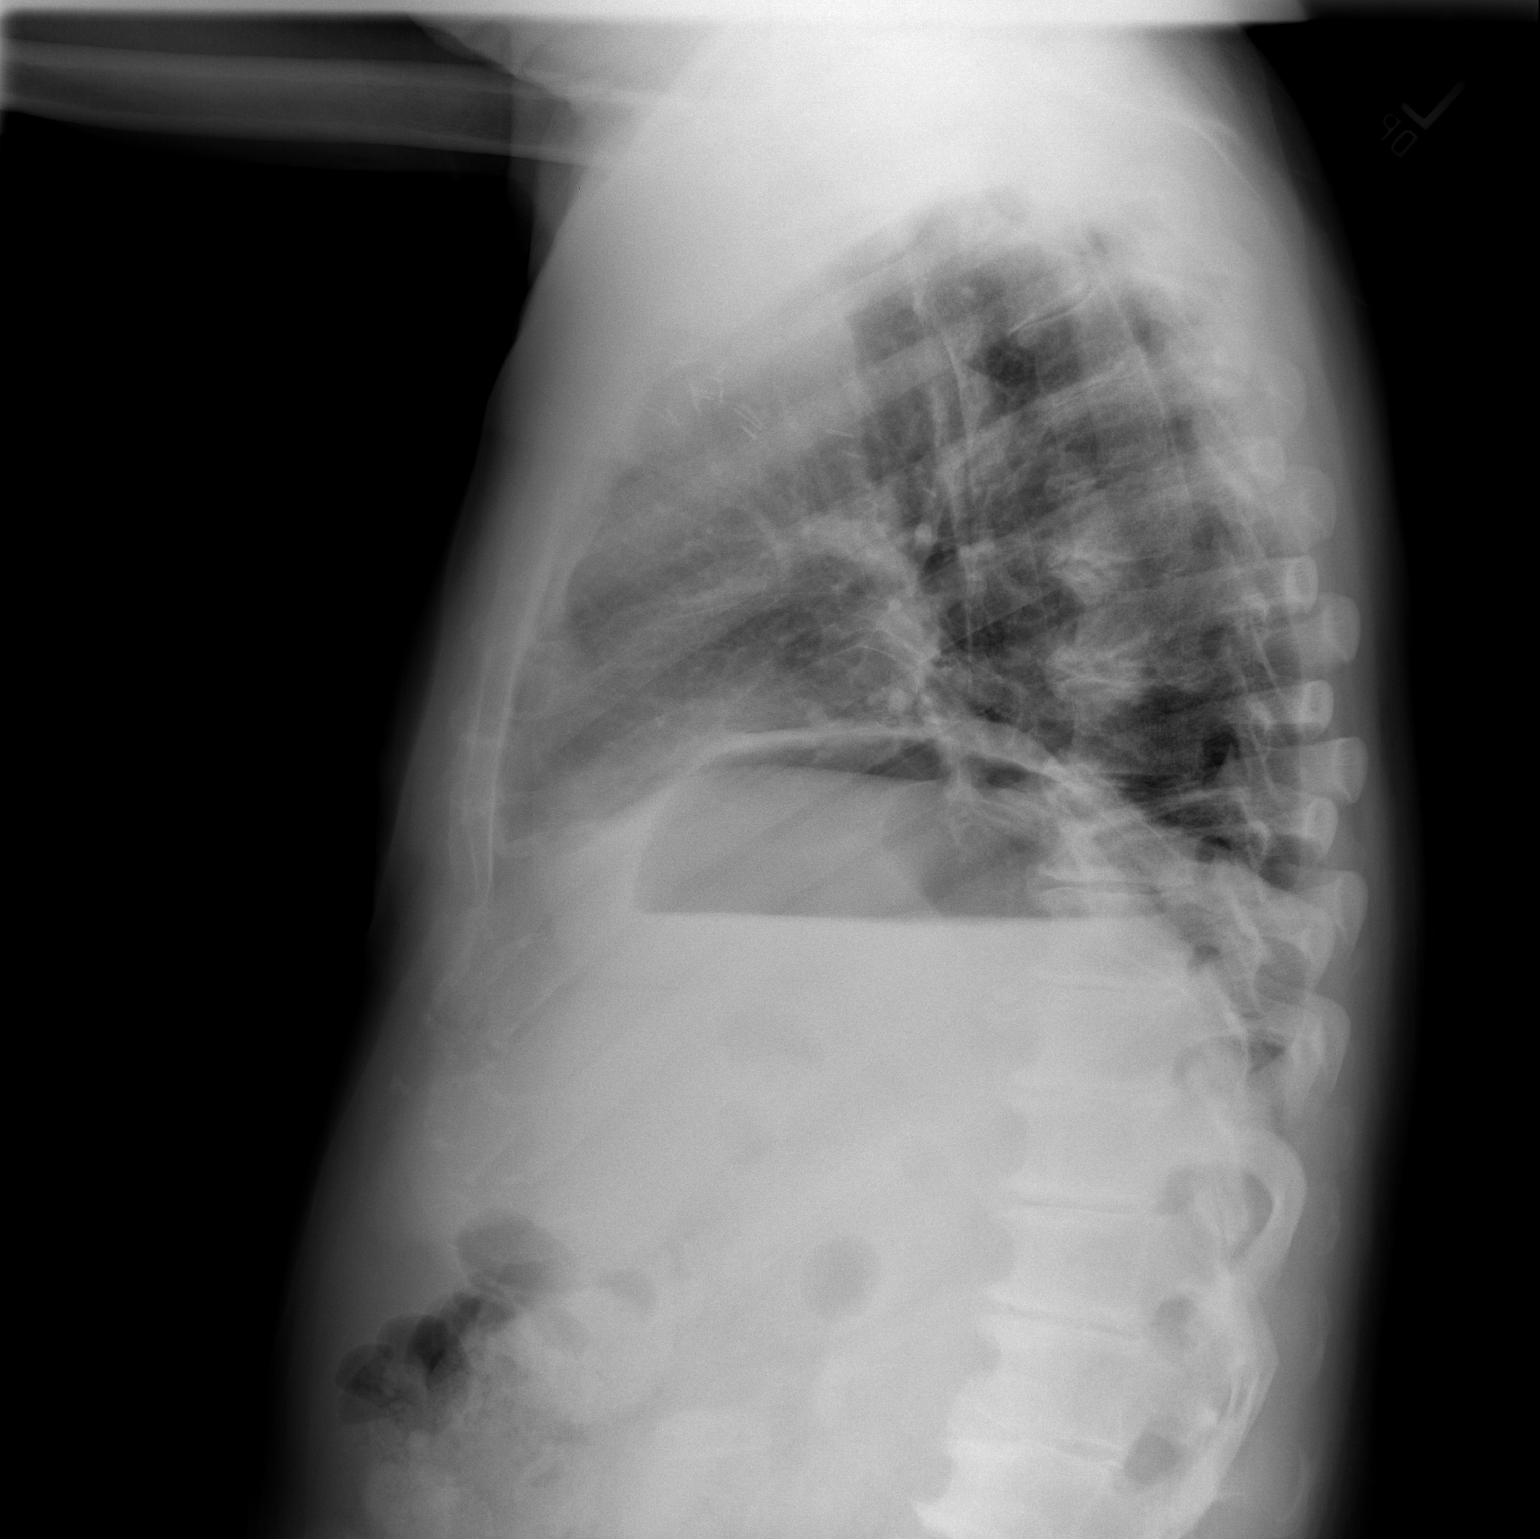

[2 of 2 positions shown; findings below may reference images not displayed]

FINDINGS: Mediastinum hilar structures normal. Low lung volumes with mild
basilar atelectasis. Basilar atelectasis has slightly improved. No
pleural effusion or pneumothorax. No acute bony abnormality .
IMPRESSION: Low lung volumes with mild bibasilar atelectasis, bibasilar
subsegmental atelectasis has improved scratched from prior exam .

## 2016-09-19 ENCOUNTER — Other Ambulatory Visit: Payer: Self-pay | Admitting: Thoracic Surgery (Cardiothoracic Vascular Surgery)

## 2016-09-19 DIAGNOSIS — E328 Other diseases of thymus: Secondary | ICD-10-CM

## 2016-09-20 ENCOUNTER — Encounter: Payer: Self-pay | Admitting: Thoracic Surgery (Cardiothoracic Vascular Surgery)

## 2016-09-20 ENCOUNTER — Ambulatory Visit (INDEPENDENT_AMBULATORY_CARE_PROVIDER_SITE_OTHER): Payer: Medicare Other | Admitting: Thoracic Surgery (Cardiothoracic Vascular Surgery)

## 2016-09-20 ENCOUNTER — Ambulatory Visit
Admission: RE | Admit: 2016-09-20 | Discharge: 2016-09-20 | Disposition: A | Discharge status: Home / Self Care | Payer: Medicare Other | Source: Ambulatory Visit | Attending: Thoracic Surgery (Cardiothoracic Vascular Surgery) | Admitting: Thoracic Surgery (Cardiothoracic Vascular Surgery)

## 2016-09-20 VITALS — BP 109/75 | HR 76 | Resp 16 | Ht 70.0 in | Wt 190.0 lb

## 2016-09-20 DIAGNOSIS — E328 Other diseases of thymus: Secondary | ICD-10-CM

## 2016-09-20 DIAGNOSIS — Z9089 Acquired absence of other organs: Secondary | ICD-10-CM

## 2016-09-20 DIAGNOSIS — R0602 Shortness of breath: Secondary | ICD-10-CM | POA: Diagnosis not present

## 2016-09-20 NOTE — Progress Notes (Signed)
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 Postop he had left phrenic and left recurrent nerve dysfunction. He was able to compensate for the recurrent nerve dysfunction and his voice is back to baseline but still fatigues easily.  He started back on enzalutamide due to a rising PSA level. He says he was getting along pretty well before that but that his energy levels have dropped significantly since restarting the medication. He still does not have the physical stamina that he had prior to his thymectomy.  Past Medical History:  Diagnosis Date  . Atrial fibrillation ([place])   . Colon polyps    hyperplastic and tubular adenomatous  . Diverticulosis   . Dysrhythmia    hx at fib- ablation 8 yrs ago  . Erectile dysfunction following radical prostatectomy   . Mediastinal mass    per CT CHEST 11/07/14 @ [facility].[name] KitchenANTERIOR...4.2 X 3.3 cm soft tissue  . Neuromuscular disorder ([place])   . Prostate cancer ([place])   . Radiation    for prostate cancer  . Radiation proctitis    rectum  . Thrombocytopenia ([facility])      Current Outpatient Prescriptions  Medication Sig Dispense Refill  . enzalutamide (XTANDI) 40 MG capsule Take 160 mg by mouth daily.    . meloxicam (MOBIC) 15 MG tablet Take 15 mg by mouth 2 (two) times daily.     No current facility-administered medications for this visit.     Physical Exam BP 109/75 (BP Location: Left Arm, Patient Position: Sitting, Cuff Size: Large)   Pulse 76   Resp 16   Ht 5\' 10"  (1.778 m)   Wt 190 lb (86.2 kg)   SpO2 96% Comment: ON RA  BMI 27.26 kg/m   Diagnostic Tests: CHEST  2 VIEW  COMPARISON:  None.  FINDINGS: No active infiltrate or effusion is seen. Minimal linear scarring remains the left lung base with slight elevation of the left hemidiaphragm unchanged. Mediastinal and hilar  contours are unremarkable. The heart is within normal limits in size. There are mild degenerative changes throughout the thoracic spine. Surgical clips overlie the anterior mediastinum.  IMPRESSION: No active cardiopulmonary disease.   Electronically Signed   By: Ivar Drape M.D.   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 Procedure complicated by left phrenic nerve and left recurrent nerve dysfunction.  His chest x-ray is stable. His left hemidiaphragm is slightly elevated but there is not major atelectasis or volume loss.   I would like another CT scan on him in about 6 months or so for follow-up with a thymectomy. He is going to have another scan with his study protocol around that time. His most recent one was in October, but they did not include the chest and that. I will check with Dr. Alinda Money see if they could scan the upper part of his chest as well.  Plan: Return in 6 months with PA and lateral chest x-ray  Melrose Nakayama, MD [facility] (  [phone]    

## 2016-11-01 ENCOUNTER — Other Ambulatory Visit: Payer: Self-pay | Admitting: Urology

## 2016-11-01 DIAGNOSIS — C61 Malignant neoplasm of prostate: Secondary | ICD-10-CM

## 2016-11-04 ENCOUNTER — Encounter: Payer: Self-pay | Admitting: Internal Medicine

## 2016-11-11 ENCOUNTER — Encounter: Payer: Self-pay | Admitting: Internal Medicine

## 2016-12-12 DIAGNOSIS — C61 Malignant neoplasm of prostate: Secondary | ICD-10-CM | POA: Diagnosis not present

## 2016-12-21 ENCOUNTER — Ambulatory Visit (HOSPITAL_COMMUNITY)
Admission: RE | Admit: 2016-12-21 | Discharge: 2016-12-21 | Disposition: A | Discharge status: Home / Self Care | Payer: Medicare Other | Source: Ambulatory Visit | Attending: Urology | Admitting: Urology

## 2016-12-21 DIAGNOSIS — C61 Malignant neoplasm of prostate: Secondary | ICD-10-CM | POA: Diagnosis not present

## 2016-12-21 MED ORDER — TECHNETIUM TC 99M MEDRONATE IV KIT
20.1000 | PACK | Freq: Once | INTRAVENOUS | Status: AC | PRN
  Administered 2016-12-21: 20.1 via INTRAVENOUS

## 2017-01-03 ENCOUNTER — Telehealth: Payer: Self-pay | Admitting: *Deleted

## 2017-01-03 NOTE — Telephone Encounter (Signed)
Patient has complicated history.   Please advise.  [name] [name]/PV

## 2017-01-04 NOTE — Telephone Encounter (Signed)
looks ok to me. Anything in particular that concerns you?

## 2017-01-05 NOTE — Telephone Encounter (Signed)
Per Dr Henrene Pastor and Osvaldo Angst, ok to proceed as scheduled at [facility]

## 2017-01-16 ENCOUNTER — Ambulatory Visit (AMBULATORY_SURGERY_CENTER): Payer: Self-pay | Admitting: *Deleted

## 2017-01-16 VITALS — Ht 70.0 in | Wt 192.0 lb

## 2017-01-16 DIAGNOSIS — Z86010 Personal history of colonic polyps: Secondary | ICD-10-CM

## 2017-01-16 MED ORDER — NA SULFATE-K SULFATE-MG SULF 17.5-3.13-1.6 GM/177ML PO SOLN: 1.0000 | Freq: Once | ORAL | 0 refills | Status: AC

## 2017-01-16 NOTE — Progress Notes (Addendum)
No egg or soy allergy known to patient  No issues with past sedation with any surgeries  or procedures, no intubation problems  No diet pills per patient No home 02 use per patient  No blood thinners per patient  Pt denies issues with constipation  No A fib or A flutter  EMMI video declined- pt states he has watched it before and does not want it again

## 2017-01-19 IMAGING — CR DG CHEST 2V
2 series · 2 of 2 positions shown · non-contrast
Comparison: PA and lateral chest x-ray March 24, 2015

CLINICAL DATA: Status post surgery in Tuesday November, 2014 for a thymic
cyst, onset of shortness of breath today, history of atrial
fibrillation.

EXAM:
CHEST  2 VIEW

[w chest pa]
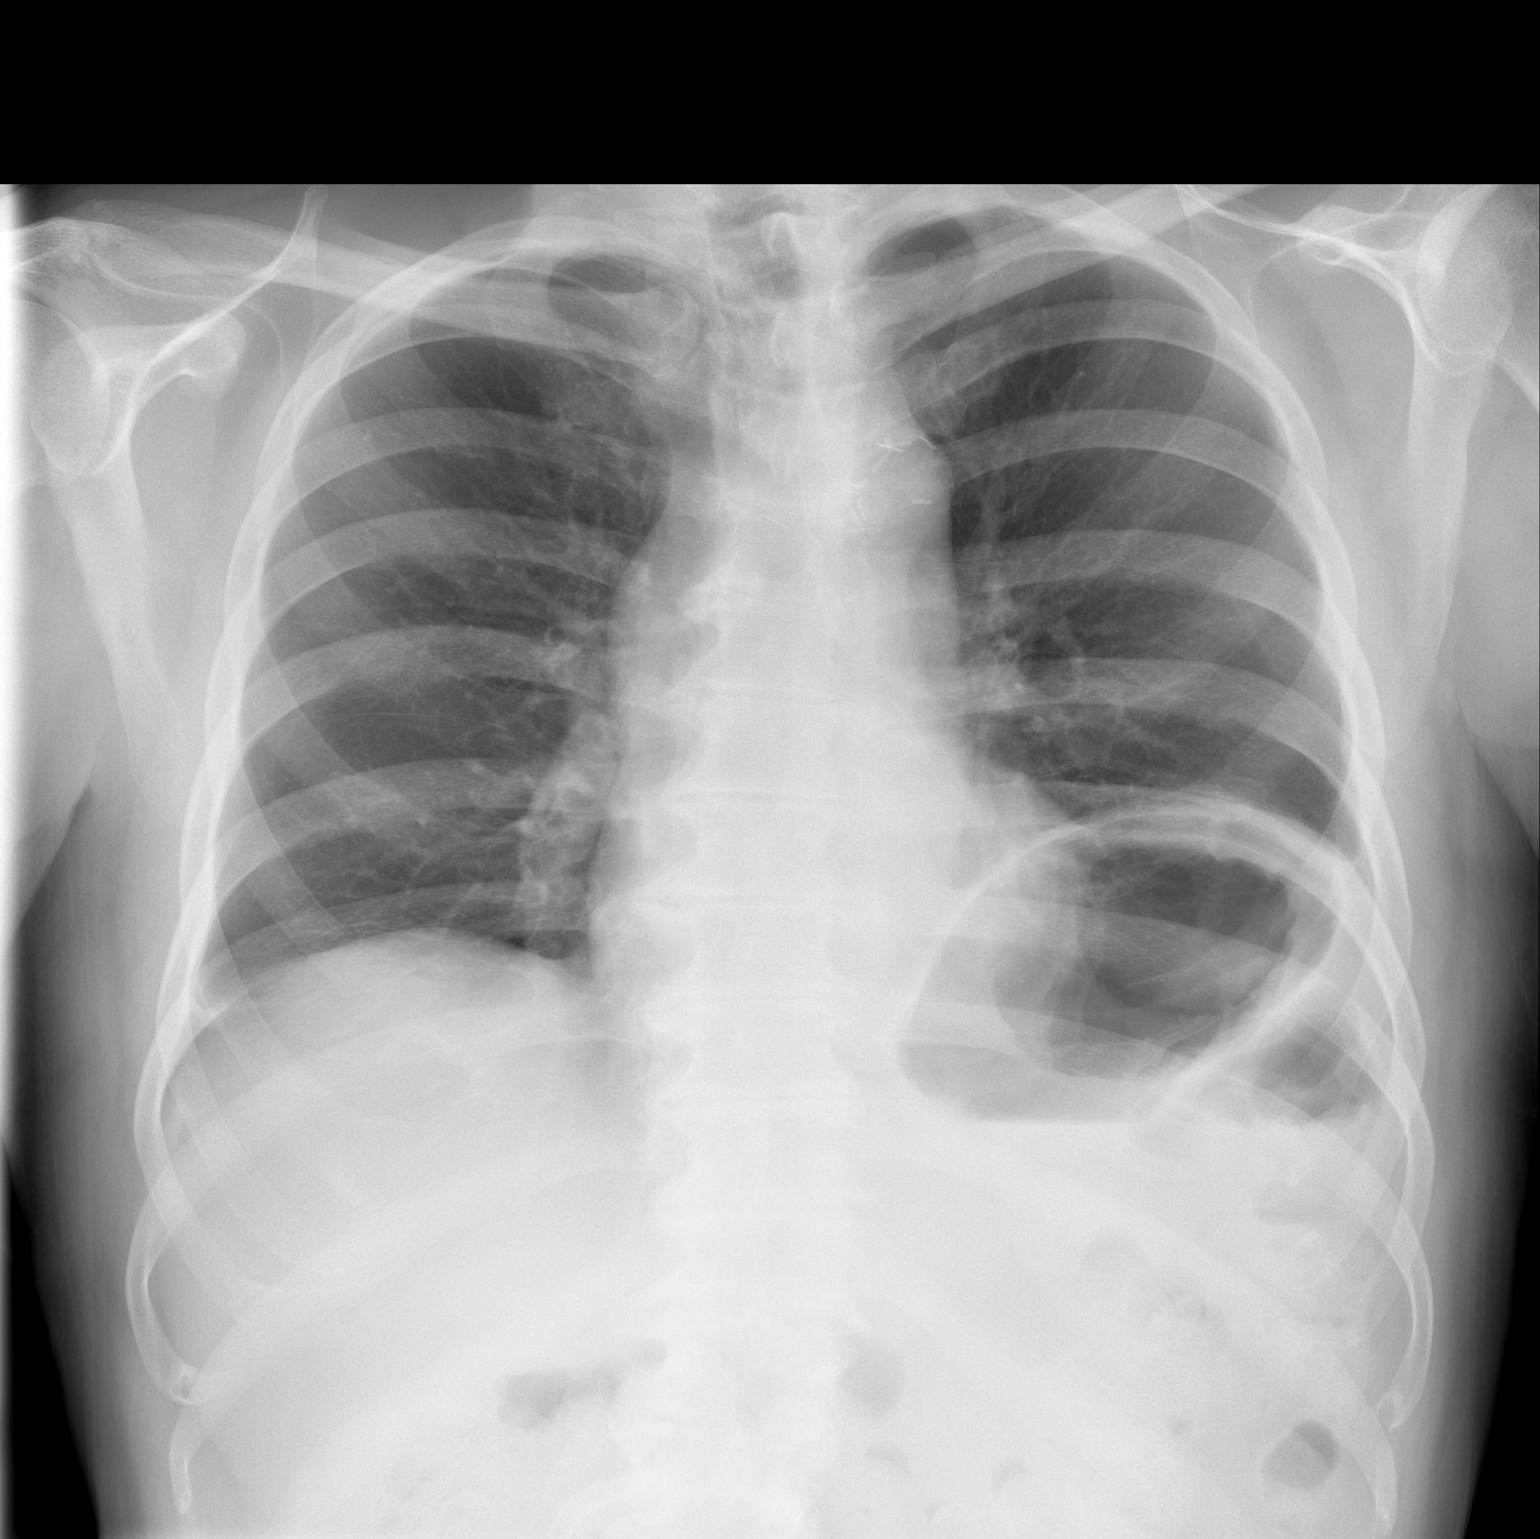

[w chest lat]
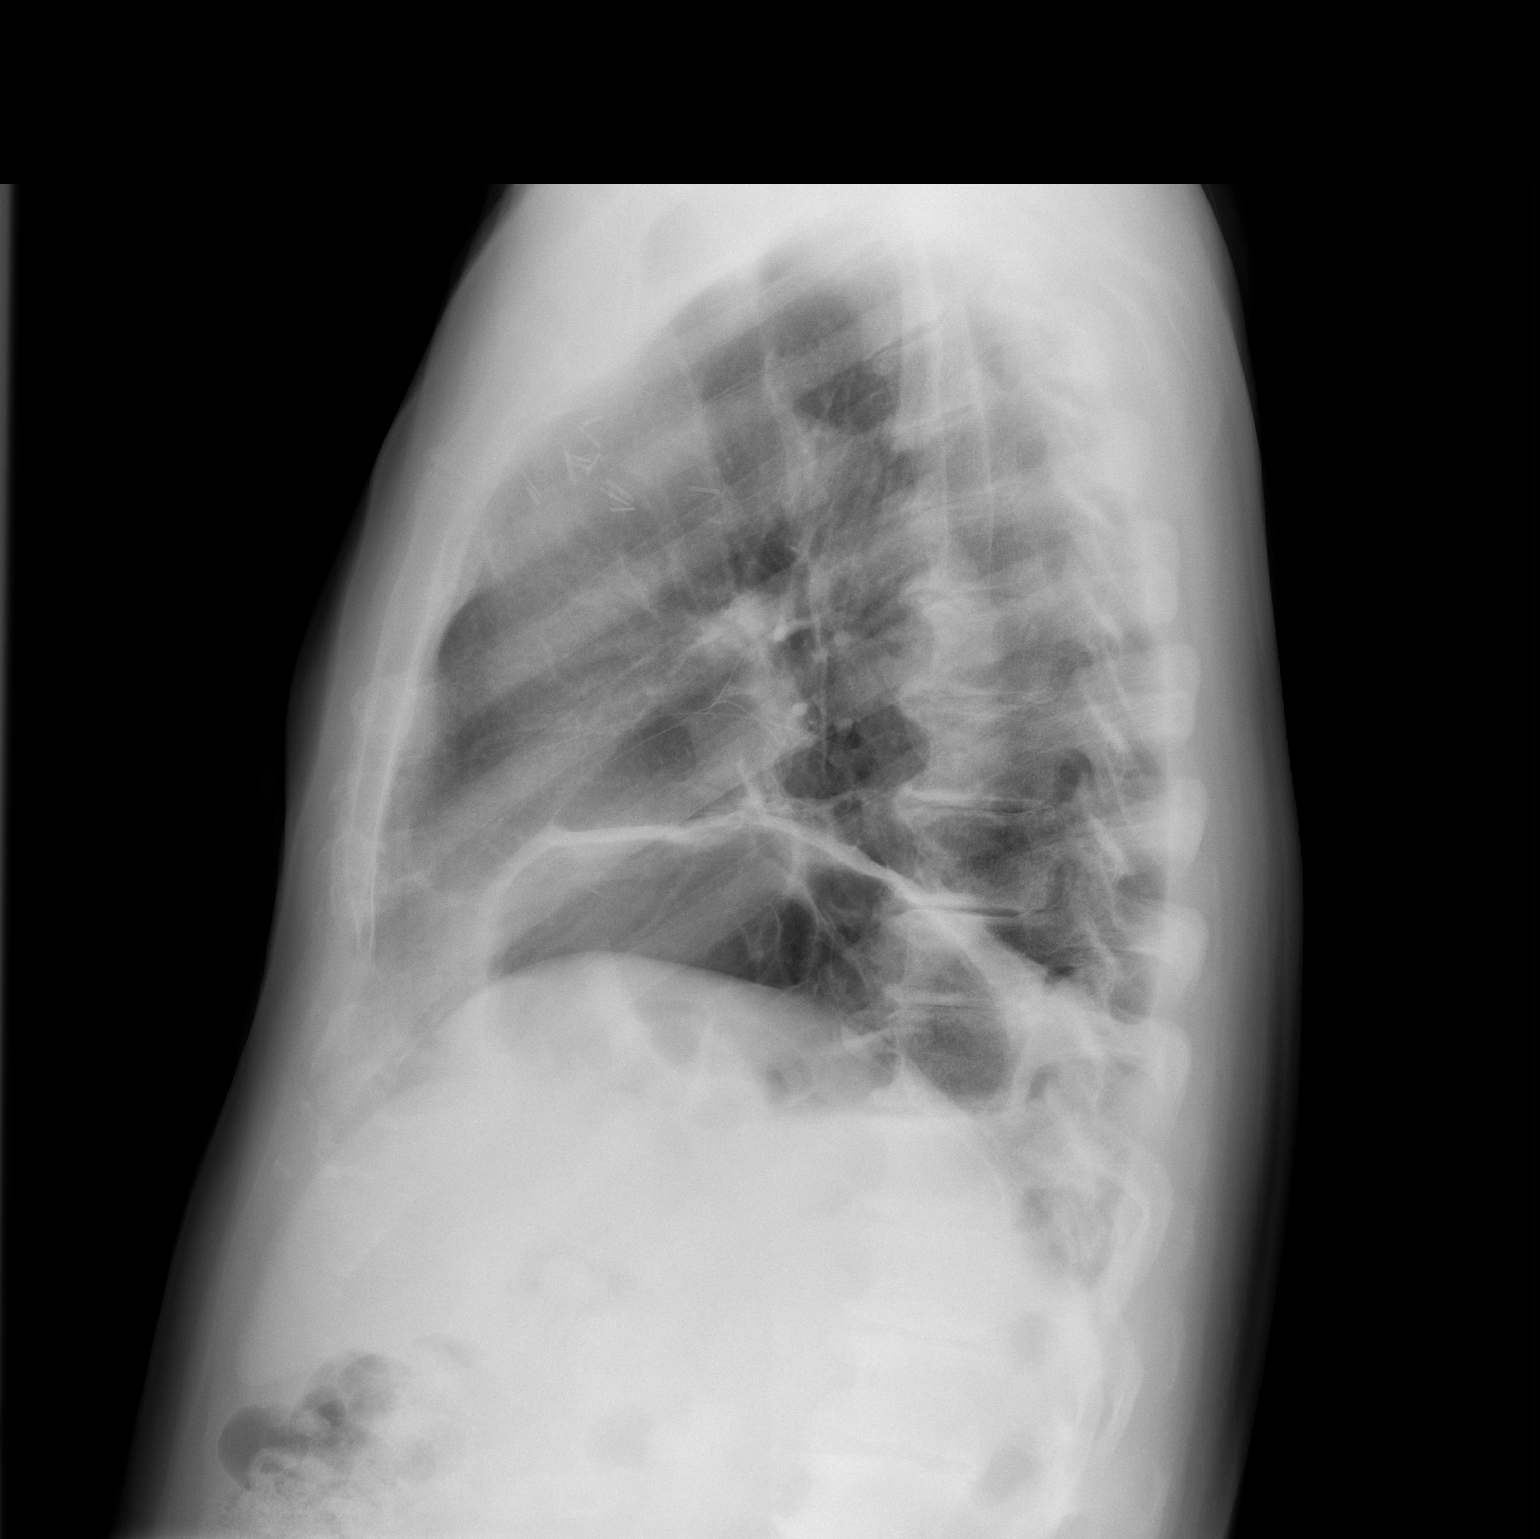

[2 of 2 positions shown; findings below may reference images not displayed]

FINDINGS: The lungs are hypoinflated. There is no focal infiltrate. There is
no pleural effusion or pneumothorax. The left hemidiaphragm remains
mildly elevated. The heart and pulmonary vascularity are normal. The
mediastinum is mildly widened inferiorly but stable. There surgical
clips which project over the anterior aspect of the superior
mediastinum. There is multilevel degenerative disc disease of the
thoracic spine.
IMPRESSION: Stable mild hypoinflation with mild left hemidiaphragm elevated.
There is no pneumonia, CHF, nor other acute cardiopulmonary
abnormality.

## 2017-02-02 ENCOUNTER — Encounter: Payer: Self-pay | Admitting: Internal Medicine

## 2017-02-02 ENCOUNTER — Ambulatory Visit (AMBULATORY_SURGERY_CENTER): Payer: Medicare Other | Admitting: Internal Medicine

## 2017-02-02 VITALS — BP 118/75 | HR 53 | Temp 97.3°F | Resp 13 | Ht 70.0 in | Wt 192.0 lb

## 2017-02-02 DIAGNOSIS — I4891 Unspecified atrial fibrillation: Secondary | ICD-10-CM | POA: Diagnosis not present

## 2017-02-02 DIAGNOSIS — Z86010 Personal history of colonic polyps: Secondary | ICD-10-CM | POA: Diagnosis not present

## 2017-02-02 MED ORDER — SODIUM CHLORIDE 0.9 % IV SOLN: 500.0000 mL | INTRAVENOUS | Status: AC

## 2017-02-02 NOTE — Op Note (Signed)
[name] Patient Name: [name] [name] Procedure Date: 02/02/2017 10:59 AM MRN: [id number] Endoscopist: Docia Chuck. Henrene Pastor , MD Age: 76 Referring MD:  Date of Birth: Aug 08, 1941 Gender: Male Account #: [id number] Procedure:                Colonoscopy Indications:              High risk colon cancer surveillance: Personal                            history of non-advanced adenoma. Previous                            examinations 2006 and 2013 Medicines:                Monitored Anesthesia Care Procedure:                Pre-Anesthesia Assessment:                           - Prior to the procedure, a History and Physical                            was performed, and patient medications and                            allergies were reviewed. The patient's tolerance of                            previous anesthesia was also reviewed. The risks                            and benefits of the procedure and the sedation                            options and risks were discussed with the patient.                            All questions were answered, and informed consent                            was obtained. Prior Anticoagulants: The patient has                            taken no previous anticoagulant or antiplatelet                            agents. ASA Grade Assessment: II - A patient with                            mild systemic disease. After reviewing the risks                            and benefits, the patient was deemed in  satisfactory condition to undergo the procedure.                           After obtaining informed consent, the colonoscope                            was passed under direct vision. Throughout the                            procedure, the patient's blood pressure, pulse, and                            oxygen saturations were monitored continuously. The                            Model CF-HQ190L [id number]) scope was  introduced                            through the anus and advanced to the the cecum,                            identified by appendiceal orifice and ileocecal                            valve. The ileocecal valve, appendiceal orifice,                            and rectum were photographed. The quality of the                            bowel preparation was excellent. The colonoscopy                            was performed without difficulty. The patient                            tolerated the procedure well. The bowel preparation                            used was SUPREP. Scope In: 11:15:13 AM Scope Out: 11:29:24 AM Scope Withdrawal Time: 0 hours 11 minutes 36 seconds  Total Procedure Duration: 0 hours 14 minutes 11 seconds  Findings:                 Multiple small-mouthed diverticula were found in                            the sigmoid colon.                           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 This was localized and without bleeding.  The exam was otherwise without abnormality on                            direct and retroflexion views. Complications:            No immediate complications. Estimated blood loss:                            None. Estimated Blood Loss:     Estimated blood loss: none. Impression:               - Diverticulosis in the sigmoid colon.                           - Mild radiation proctitis.                           - The examination was otherwise normal on direct                            and retroflexion views.                           - No specimens collected. Recommendation:           - Repeat colonoscopy is not recommended for                            surveillance, given your current age and favorable                            findings on today's examination.                           - Patient has a contact number available for                             emergencies. The signs and symptoms of potential                            delayed complications were discussed with the                            patient. Return to normal activities tomorrow.                            Written discharge instructions were provided to the                            patient.                           - Resume previous diet.                           - Continue present medications. Docia Chuck. Henrene Pastor, MD 02/02/2017 11:37:21 AM This report has been signed electronically.

## 2017-02-02 NOTE — Patient Instructions (Signed)
YOU HAD AN ENDOSCOPIC PROCEDURE TODAY AT [facility]:   Refer to the procedure report that was given to you for any specific questions about what was found during the examination.  If the procedure report does not answer your questions, please call your gastroenterologist to clarify.  If you requested that your care partner not be given the details of your procedure findings, then the procedure report has been included in a sealed envelope for you to review at your convenience later.  YOU SHOULD EXPECT: Some feelings of bloating in the abdomen. Passage of more gas than usual.  Walking can help get rid of the air that was put into your GI tract during the procedure and reduce the bloating. If you had a lower endoscopy (such as a colonoscopy or flexible sigmoidoscopy) you may notice spotting of blood in your stool or on the toilet paper. If you underwent a bowel prep for your procedure, you may not have a normal bowel movement for a few days.  Please Note:  You might notice some irritation and congestion in your nose or some drainage.  This is from the oxygen used during your procedure.  There is no need for concern and it should clear up in a day or so.  SYMPTOMS TO REPORT IMMEDIATELY:   Following lower endoscopy (colonoscopy or flexible sigmoidoscopy):  Excessive amounts of blood in the stool  Significant tenderness or worsening of abdominal pains  Swelling of the abdomen that is new, acute  Fever of 100F or higher   Following upper endoscopy (EGD)  Vomiting of blood or coffee ground material  New chest pain or pain under the shoulder blades  Painful or persistently difficult swallowing  New shortness of breath  Fever of 100F or higher  Black, tarry-looking stools  For urgent or emergent issues, a gastroenterologist can be reached at any hour by calling [phone].   DIET:  We do recommend a small meal at first, but then you may proceed to your regular diet.  Drink  plenty of fluids but you should avoid alcoholic beverages for 24 hours.  ACTIVITY:  You should plan to take it easy for the rest of today and you should NOT DRIVE or use heavy machinery until tomorrow (because of the sedation medicines used during the test).    FOLLOW UP: Our staff will call the number listed on your records the next business day following your procedure to check on you and address any questions or concerns that you may have regarding the information given to you following your procedure. If we do not reach you, we will leave a message.  However, if you are feeling well and you are not experiencing any problems, there is no need to return our call.  We will assume that you have returned to your regular daily activities without incident.  If any biopsies were taken you will be contacted by phone or by letter within the next 1-3 weeks.  Please call us at [phone] if you have not heard about the biopsies in 3 weeks.    SIGNATURES/CONFIDENTIALITY: You and/or your care partner have signed paperwork which will be entered into your electronic medical record.  These signatures attest to the fact that that the information above on your After Visit Summary has been reviewed and is understood.  Full responsibility of the confidentiality of this discharge information lies with you and/or your care-partner.   Information on diverticulosis given to you today

## 2017-02-02 NOTE — Progress Notes (Signed)
Pt's states no medical or surgical changes since previsit or office visit. maw 

## 2017-02-02 NOTE — Progress Notes (Signed)
Report to PACU, RN, vss, BBS= Clear.  

## 2017-02-03 ENCOUNTER — Telehealth: Payer: Self-pay | Admitting: *Deleted

## 2017-02-03 NOTE — Telephone Encounter (Signed)
  Follow up Call-  Call back number 02/02/2017  Post procedure Call Back phone  # [phone] cell  Permission to leave phone message Yes  Some recent data might be hidden     No answer at # given.  Left message on VM.

## 2017-02-03 NOTE — Telephone Encounter (Signed)
Left message will attempt second call back this afternoon. SM

## 2017-03-21 ENCOUNTER — Ambulatory Visit: Payer: Medicare Other | Admitting: Thoracic Surgery (Cardiothoracic Vascular Surgery)

## 2017-04-11 ENCOUNTER — Ambulatory Visit: Payer: Medicare Other | Admitting: Thoracic Surgery (Cardiothoracic Vascular Surgery)

## 2017-04-17 ENCOUNTER — Other Ambulatory Visit: Payer: Self-pay | Admitting: Thoracic Surgery (Cardiothoracic Vascular Surgery)

## 2017-04-17 DIAGNOSIS — E328 Other diseases of thymus: Secondary | ICD-10-CM

## 2017-04-18 ENCOUNTER — Ambulatory Visit
Admission: RE | Admit: 2017-04-18 | Discharge: 2017-04-18 | Disposition: A | Discharge status: Home / Self Care | Payer: Medicare Other | Source: Ambulatory Visit | Attending: Thoracic Surgery (Cardiothoracic Vascular Surgery) | Admitting: Thoracic Surgery (Cardiothoracic Vascular Surgery)

## 2017-04-18 ENCOUNTER — Encounter: Payer: Self-pay | Admitting: Thoracic Surgery (Cardiothoracic Vascular Surgery)

## 2017-04-18 ENCOUNTER — Ambulatory Visit (INDEPENDENT_AMBULATORY_CARE_PROVIDER_SITE_OTHER): Payer: Medicare Other | Admitting: Thoracic Surgery (Cardiothoracic Vascular Surgery)

## 2017-04-18 VITALS — BP 106/73 | HR 71 | Resp 16 | Ht 70.0 in | Wt 181.4 lb

## 2017-04-18 DIAGNOSIS — E328 Other diseases of thymus: Secondary | ICD-10-CM | POA: Diagnosis not present

## 2017-04-18 DIAGNOSIS — Z85238 Personal history of other malignant neoplasm of thymus: Secondary | ICD-10-CM | POA: Diagnosis not present

## 2017-04-18 DIAGNOSIS — Z8709 Personal history of other diseases of the respiratory system: Secondary | ICD-10-CM | POA: Diagnosis not present

## 2017-04-18 DIAGNOSIS — R0602 Shortness of breath: Secondary | ICD-10-CM | POA: Diagnosis not present

## 2017-04-18 DIAGNOSIS — Z9089 Acquired absence of other organs: Secondary | ICD-10-CM

## 2017-04-18 DIAGNOSIS — L989 Disorder of the skin and subcutaneous tissue, unspecified: Secondary | ICD-10-CM | POA: Diagnosis not present

## 2017-04-18 DIAGNOSIS — L918 Other hypertrophic disorders of the skin: Secondary | ICD-10-CM | POA: Diagnosis not present

## 2017-04-18 NOTE — Progress Notes (Signed)
      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 He had left phrenic and left recurrent nerve dysfunction postoperatively. He also is on enzalutamide for prostate cancer.   Overall is been feeling pretty well. He still does not have much energy. He has been playing golf. He has not had any weight loss or any unusual bone or joint pain. He had follow-up CTs and bone scans in April which did not show any progression of disease and showed regression in some areas.  Past Medical History:  Diagnosis Date  . Arthritis    fingers ,shoulder   . Atrial fibrillation ([place])   . Colon polyps    hyperplastic and tubular adenomatous  . Diverticulosis   . Dysrhythmia    hx at fib- ablation 8 yrs ago  . Erectile dysfunction following radical prostatectomy   . Mediastinal mass    per CT CHEST 11/07/14 @ [facility].[name] KitchenANTERIOR...4.2 X 3.3 cm soft tissue  . Neuromuscular disorder ([place])   . Prostate cancer [facility])    prostate cancer  . Radiation    for prostate cancer  . Radiation proctitis    rectum  . Recurrent laryngeal nerve palsy   . Respiratory failure ([facility])    s/p phrenic nerve palsy   . Thrombocytopenia ([facility])     Current Outpatient Prescriptions  Medication Sig Dispense Refill  . enzalutamide (XTANDI) 40 MG capsule Take 160 mg by mouth daily.    . meloxicam (MOBIC) 15 MG tablet Take 7.5 mg by mouth 2 (two) times daily.      Current Facility-Administered Medications  Medication Dose Route Frequency Provider Last Rate Last Dose  . 0.9 %  sodium chloride infusion  500 mL Intravenous Continuous Irene Shipper, MD        Physical Exam BP 106/73 (BP Location: Left Arm, Patient Position: Sitting, Cuff Size: Large)   Pulse 71   Resp 16   Ht 5\' 10"  (1.778 m)   Wt 181 lb 6.4 oz (82.3 kg)   SpO2 95% Comment: ON RA  BMI 26.81  kg/m  76 year old man in no acute distress Alert and oriented 3 with no focal deficits Lungs very slightly diminished in left base, otherwise clear Cardiac regular rate and rhythm normal S1 and S2  Diagnostic Tests:  I personally reviewed his CT chest from April and his PA and lateral chest x-ray from today. Both scans show slight elevation of left hemidiaphragm. On CT there is some very mild basilar atelectasis. There is no no change in the anterior mediastinum other than the postoperative changes which are stable.  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  His chest x-ray is stable. There is still slight elevation of left hemidiaphragm but no major atelectasis or volume loss. His CT from April showed no evidence recurrent disease.  His prostate cancer is followed by Dr. Alinda [name]. He appears to be having good response to enzalutamide.  Plan: Return in 9 months for three-year follow-up visit.  Melrose Nakayama, MD [facility] [phone]

## 2017-04-26 ENCOUNTER — Encounter: Payer: Self-pay | Admitting: Cardiology

## 2017-05-08 NOTE — Progress Notes (Signed)
HPI: FU atrial fibrillation. Pt with h/o AFib s/p ablation, thrombocytopenia, neuromuscular d/o, prostate CA and resection of mediastinal mass.He has had 3 prior cardioversions and had been on long-term Coumadin.Previously had atrial fibrillation ablation at [facility] had had an excellent response and he had no recurrent atrial fibrillation until 4/16 postoperatively following mediastinal mass resection.Nuclear study 2005 with EF 48 and no ischemia or infarction. Last echo 3/16 showed normal LV function. Since last seen, he continues with dyspnea on exertion which is unchanged. No orthopnea, PND, pedal edema, chest pain or syncope. No episodes of atrial fibrillation by his report.  Current Outpatient Prescriptions  Medication Sig Dispense Refill  . enzalutamide (XTANDI) 40 MG capsule Take 160 mg by mouth daily.    . meloxicam (MOBIC) 15 MG tablet Take 7.5 mg by mouth 2 (two) times daily.      Current Facility-Administered Medications  Medication Dose Route Frequency Provider Last Rate Last Dose  . 0.9 %  sodium chloride infusion  500 mL Intravenous Continuous Irene Shipper, MD         Past Medical History:  Diagnosis Date  . Arthritis    fingers ,shoulder   . Atrial fibrillation ([place])   . Colon polyps    hyperplastic and tubular adenomatous  . Diverticulosis   . Dysrhythmia    hx at fib- ablation 8 yrs ago  . Erectile dysfunction following radical prostatectomy   . Mediastinal mass    per CT CHEST 11/07/14 @ [facility].[name] KitchenANTERIOR...4.2 X 3.3 cm soft tissue  . Neuromuscular disorder ([place])   . Prostate cancer [facility])    prostate cancer  . Radiation    for prostate cancer  . Radiation proctitis    rectum  . Recurrent laryngeal nerve palsy   . Respiratory failure ([facility])    s/p phrenic nerve palsy   . Thrombocytopenia ([place])     Past Surgical History:  Procedure Laterality Date  . ATRIAL ABLATION SURGERY     for a fib x 5 years ago  . BACK  SURGERY    . COLONOSCOPY    . CYSTOSCOPY N/A 11/26/2014   Procedure: CYSTOSCOPY FLEXIBLE;  Surgeon: Bjorn Loser, MD;  Location: [facility];  Service: Urology;  Laterality: N/A;  . CYSTOSCOPY WITH URETHRAL DILATATION N/A 11/26/2014   Procedure: CYSTOSCOPY WITH URETHRAL DILATATION WITH INSERTION OF FOLEY;  Surgeon: Bjorn Loser, MD;  Location: [facility];  Service: Urology;  Laterality: N/A;  . FLEXIBLE SIGMOIDOSCOPY  02/10/2012   Procedure: FLEXIBLE SIGMOIDOSCOPY;  Surgeon: Irene Shipper, MD;  Location: [facility];  Service: Endoscopy;  Laterality: N/A;  need APC   . KNEE ARTHROSCOPY Left   . LEAD REMOVAL N/A 11/26/2014   Procedure: CRYO INTERCOSTAL NERVE BLOCK;  Surgeon: Melrose Nakayama, MD;  Location: [facility];  Service: Thoracic;  Laterality: N/A;  . POLYPECTOMY    . PROSTATE SURGERY    . RESECTION OF MEDIASTINAL MASS N/A 11/26/2014   Procedure: RESECTION OF ANTERIOR MEDIASTINAL MASS;  Surgeon: Melrose Nakayama, MD;  Location: [facility];  Service: Thoracic;  Laterality: N/A;  . ROBOT ASSISTED LAPAROSCOPIC RADICAL PROSTATECTOMY  2009   radiation Tx 2011  . TONSILLECTOMY    . VASECTOMY    . VIDEO ASSISTED THORACOSCOPY Left 11/26/2014   Procedure: VIDEO ASSISTED THORACOSCOPY;  Surgeon: Melrose Nakayama, MD;  Location: [facility];  Service: Thoracic;  Laterality: Left;    Social History   Social History  . Marital status: Married  Spouse name: N/A  . Number of children: 4  . Years of education: N/A   Occupational History  . orhodontist    Social History Main Topics  . Smoking status: Never Smoker  . Smokeless tobacco: Never Used  . Alcohol use No  . Drug use: No  . Sexual activity: No   Other Topics Concern  . Not on file   Social History Narrative  . No narrative on file    Family History  Problem Relation Age of Onset  . Prostate cancer Father   . Heart disease Mother   . Heart attack Mother   . Healthy Sister   . Healthy Sister   . Colon cancer Neg Hx   .  Esophageal cancer Neg Hx   . Rectal cancer Neg Hx   . Stomach cancer Neg Hx   . Pancreatic cancer Neg Hx     ROS: no fevers or chills, productive cough, hemoptysis, dysphasia, odynophagia, melena, hematochezia, dysuria, hematuria, rash, seizure activity, orthopnea, PND, pedal edema, claudication. Remaining systems are negative.  Physical Exam: Well-developed well-nourished in no acute distress.  Skin is warm and dry.  HEENT is normal.  Neck is supple.  Chest is clear to auscultation with normal expansion.  Cardiovascular exam is regular rate and rhythm.  Abdominal exam nontender or distended. No masses palpated. Extremities show no edema. neuro grossly intact  ECG- sinus rhythm at a rate of 59. Nonspecific ST changes. personally reviewed  A/P  1 Paroxysmal atrial fibrillation-patient remains in sinus rhythm today. He is status post ablation. Anticoagulation was discontinued previously. His metoprolol was discontinued previously because he felt it was contributing to dyspnea on exertion by not allowing him to increase his heart rate. CHADSvasc 1. He would prefer to continue off of anticoagulation.  2 status post resection of thymoma-follow-up thoracic surgery.  3 history of prostate cancer-management per primary care.  Kirk Ruths, MD

## 2017-05-19 ENCOUNTER — Encounter: Payer: Self-pay | Admitting: Cardiology

## 2017-05-19 ENCOUNTER — Ambulatory Visit (INDEPENDENT_AMBULATORY_CARE_PROVIDER_SITE_OTHER): Payer: Medicare Other | Admitting: Cardiology

## 2017-05-19 VITALS — BP 110/78 | HR 59 | Ht 70.0 in | Wt 185.0 lb

## 2017-05-19 DIAGNOSIS — I480 Paroxysmal atrial fibrillation: Secondary | ICD-10-CM | POA: Diagnosis not present

## 2017-05-19 NOTE — Patient Instructions (Signed)
Your physician wants you to follow-up in: ONE YEAR WITH DR CRENSHAW You will receive a reminder letter in the mail two months in advance. If you don't receive a letter, please call our office to schedule the follow-up appointment.   If you need a refill on your cardiac medications before your next appointment, please call your pharmacy.  

## 2017-06-23 DIAGNOSIS — C61 Malignant neoplasm of prostate: Secondary | ICD-10-CM | POA: Diagnosis not present

## 2017-06-26 ENCOUNTER — Ambulatory Visit (HOSPITAL_COMMUNITY)
Admission: RE | Admit: 2017-06-26 | Discharge: 2017-06-26 | Disposition: A | Discharge status: Home / Self Care | Payer: Medicare Other | Source: Ambulatory Visit | Attending: Urology | Admitting: Urology

## 2017-06-26 ENCOUNTER — Other Ambulatory Visit (HOSPITAL_COMMUNITY): Payer: Self-pay | Admitting: Urology

## 2017-06-26 DIAGNOSIS — C61 Malignant neoplasm of prostate: Secondary | ICD-10-CM

## 2017-06-26 DIAGNOSIS — M47816 Spondylosis without myelopathy or radiculopathy, lumbar region: Secondary | ICD-10-CM | POA: Insufficient documentation

## 2017-06-26 MED ORDER — TECHNETIUM TC 99M MEDRONATE IV KIT
18.0000 | PACK | Freq: Once | INTRAVENOUS | Status: AC | PRN
  Administered 2017-06-26: 18 via INTRAVENOUS

## 2017-06-28 DIAGNOSIS — C61 Malignant neoplasm of prostate: Secondary | ICD-10-CM | POA: Diagnosis not present

## 2017-07-05 DIAGNOSIS — Z23 Encounter for immunization: Secondary | ICD-10-CM | POA: Diagnosis not present

## 2017-07-20 NOTE — Telephone Encounter (Signed)
error 

## 2017-08-18 ENCOUNTER — Other Ambulatory Visit: Payer: Self-pay | Admitting: Urology

## 2017-08-18 DIAGNOSIS — C61 Malignant neoplasm of prostate: Secondary | ICD-10-CM

## 2017-09-27 ENCOUNTER — Telehealth: Payer: Self-pay | Admitting: Thoracic Surgery (Cardiothoracic Vascular Surgery)

## 2017-09-27 NOTE — Telephone Encounter (Signed)
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   I will look into what is available  Remo Lipps C. Roxan Hockey, MD [facility] [phone]

## 2017-10-17 DIAGNOSIS — M545 Low back pain: Secondary | ICD-10-CM | POA: Diagnosis not present

## 2017-11-10 DIAGNOSIS — H5213 Myopia, bilateral: Secondary | ICD-10-CM | POA: Diagnosis not present

## 2017-11-10 DIAGNOSIS — H40013 Open angle with borderline findings, low risk, bilateral: Secondary | ICD-10-CM | POA: Diagnosis not present

## 2017-11-10 DIAGNOSIS — H52223 Regular astigmatism, bilateral: Secondary | ICD-10-CM | POA: Diagnosis not present

## 2017-11-10 DIAGNOSIS — H524 Presbyopia: Secondary | ICD-10-CM | POA: Diagnosis not present

## 2017-11-16 DIAGNOSIS — C61 Malignant neoplasm of prostate: Secondary | ICD-10-CM | POA: Diagnosis not present

## 2017-11-20 ENCOUNTER — Encounter (HOSPITAL_COMMUNITY)
Admission: RE | Admit: 2017-11-20 | Discharge: 2017-11-20 | Disposition: A | Discharge status: Home / Self Care | Payer: Medicare Other | Source: Ambulatory Visit | Attending: Urology | Admitting: Urology

## 2017-11-20 DIAGNOSIS — C61 Malignant neoplasm of prostate: Secondary | ICD-10-CM | POA: Diagnosis not present

## 2017-11-20 MED ORDER — TECHNETIUM TC 99M MEDRONATE IV KIT: 25.0000 | PACK | Freq: Once | INTRAVENOUS | Status: DC | PRN

## 2017-11-27 ENCOUNTER — Other Ambulatory Visit (HOSPITAL_COMMUNITY): Payer: Medicare Other

## 2017-11-27 ENCOUNTER — Ambulatory Visit (HOSPITAL_COMMUNITY): Payer: Medicare Other

## 2018-01-09 DIAGNOSIS — D649 Anemia, unspecified: Secondary | ICD-10-CM | POA: Diagnosis not present

## 2018-01-09 DIAGNOSIS — M1990 Unspecified osteoarthritis, unspecified site: Secondary | ICD-10-CM | POA: Diagnosis not present

## 2018-01-09 DIAGNOSIS — C61 Malignant neoplasm of prostate: Secondary | ICD-10-CM | POA: Diagnosis not present

## 2018-01-09 DIAGNOSIS — I10 Essential (primary) hypertension: Secondary | ICD-10-CM | POA: Diagnosis not present

## 2018-01-09 DIAGNOSIS — R978 Other abnormal tumor markers: Secondary | ICD-10-CM | POA: Diagnosis not present

## 2018-01-09 DIAGNOSIS — Z0001 Encounter for general adult medical examination with abnormal findings: Secondary | ICD-10-CM | POA: Diagnosis not present

## 2018-01-09 DIAGNOSIS — L570 Actinic keratosis: Secondary | ICD-10-CM | POA: Diagnosis not present

## 2018-02-06 ENCOUNTER — Encounter: Payer: Medicare Other | Admitting: Thoracic Surgery (Cardiothoracic Vascular Surgery)

## 2018-02-20 ENCOUNTER — Encounter: Payer: Medicare Other | Admitting: Thoracic Surgery (Cardiothoracic Vascular Surgery)

## 2018-02-26 ENCOUNTER — Other Ambulatory Visit: Payer: Self-pay | Admitting: Thoracic Surgery (Cardiothoracic Vascular Surgery)

## 2018-02-26 ENCOUNTER — Other Ambulatory Visit (HOSPITAL_COMMUNITY): Payer: Self-pay | Admitting: Urology

## 2018-02-26 DIAGNOSIS — C61 Malignant neoplasm of prostate: Secondary | ICD-10-CM

## 2018-02-26 DIAGNOSIS — E328 Other diseases of thymus: Secondary | ICD-10-CM

## 2018-02-27 ENCOUNTER — Ambulatory Visit (INDEPENDENT_AMBULATORY_CARE_PROVIDER_SITE_OTHER): Payer: Medicare Other | Admitting: Thoracic Surgery (Cardiothoracic Vascular Surgery)

## 2018-02-27 ENCOUNTER — Ambulatory Visit
Admission: RE | Admit: 2018-02-27 | Discharge: 2018-02-27 | Disposition: A | Discharge status: Home / Self Care | Payer: Medicare Other | Source: Ambulatory Visit | Attending: Thoracic Surgery (Cardiothoracic Vascular Surgery) | Admitting: Thoracic Surgery (Cardiothoracic Vascular Surgery)

## 2018-02-27 ENCOUNTER — Encounter: Payer: Self-pay | Admitting: Thoracic Surgery (Cardiothoracic Vascular Surgery)

## 2018-02-27 ENCOUNTER — Other Ambulatory Visit: Payer: Self-pay

## 2018-02-27 VITALS — BP 92/63 | HR 70 | Resp 18 | Ht 70.0 in | Wt 182.0 lb

## 2018-02-27 DIAGNOSIS — E328 Other diseases of thymus: Secondary | ICD-10-CM

## 2018-02-27 DIAGNOSIS — R0602 Shortness of breath: Secondary | ICD-10-CM | POA: Diagnosis not present

## 2018-02-27 NOTE — Progress Notes (Signed)
[address]       [place] [address]             [phone]     HPI: [name] [name] returns for a scheduled follow-up visit  [name] [name] is a 77 year old gentleman who was found to have an anterior mediastinal mass on a CT scan done as part of the metastatic work-up for prostate cancer.  He underwent thymectomy for what turned out to be a cystic thymoma in April 2016.  Postoperatively he had a left phrenic and left recurrent nerve dysfunction.  I last saw him in the office 6 months ago.  Time he was starting to make some progress.  He had a good result with enzalutamide for his prostate cancer.  His exercise tolerance was improving although not back to his baseline.  He did continue with physical therapy.  In the interim since his last visit he is continued to improve.  Again he is not back to baseline, but was able to walk 18 holes of golf carrying his bag, which he was unable to do before.  Past Medical History:  Diagnosis Date  . Arthritis    fingers ,shoulder   . Atrial fibrillation ([place])   . Colon polyps    hyperplastic and tubular adenomatous  . Diverticulosis   . Dysrhythmia    hx at fib- ablation 8 yrs ago  . Erectile dysfunction following radical prostatectomy   . Mediastinal mass    per CT CHEST 11/07/14 @ [facility].[name] KitchenANTERIOR...4.2 X 3.3 cm soft tissue  . Neuromuscular disorder ([place])   . Prostate cancer [facility])    prostate cancer  . Radiation    for prostate cancer  . Radiation proctitis    rectum  . Recurrent laryngeal nerve palsy   . Respiratory failure ([facility])    s/p phrenic nerve palsy   . Thrombocytopenia ([facility])     Current Outpatient Medications  Medication Sig Dispense Refill  . enzalutamide (XTANDI) 40 MG capsule Take 160 mg by mouth daily.    . meloxicam (MOBIC) 15 MG tablet Take 7.5 mg by mouth 2 (two) times daily.      Current Facility-Administered Medications  Medication Dose Route Frequency Provider Last Rate  Last Dose  . 0.9 %  sodium chloride infusion  500 mL Intravenous Continuous [name] Shipper, MD        Physical Exam BP 92/63 (BP Location: Right Arm, Patient Position: Sitting, Cuff Size: Normal)   Pulse 70   Resp 18   Ht 5\' 10"  (1.778 m)   Wt 182 lb (82.6 kg)   SpO2 95% Comment: RA  BMI 26.14 kg/m  77 year old man in no acute distress Alert and oriented x3, no focal deficits Cardiac regular rate and rhythm Lungs mildly diminished at left base  Diagnostic Tests: CHEST - 2 VIEW  COMPARISON:  April 18, 2017  FINDINGS: There are surgical clips in the anterior mediastinum toward the left, stable. No mass is seen in this area by radiography. There is no edema or consolidation. Heart size and pulmonary vascularity are normal. No adenopathy. There is degenerative change in the thoracic spine.  IMPRESSION: Postoperative change leftward aspect anterior mediastinum. No edema or consolidation. Stable cardiac silhouette.   Electronically Signed   By: Lowella Grip III M.D.   On: 02/27/2018 10:33 I personally reviewed the chest x-ray images and concur with the findings noted above.  There appears to be more excursion of the left  hemidiaphragm on today's film there was a year ago.  Impression: [name] [name] is a 77 year old man who is now 3 years out from a thymectomy.  That was comp gated by left phrenic and recurrent nerve dysfunction.  On his chest x-ray today the left hemidiaphragm remains very slightly elevated.  He does have breath sounds at the left base.  This is definitely improved from initially but never quite back to his baseline.  His exercise tolerance has improved.  He remains very motivated to increase his activities.  Plan: I will ask [name] [name] to include the chest on his CT in September.  Return in 1 year  [name] Nakayama, MD [facility] [phone]

## 2018-04-20 DIAGNOSIS — C61 Malignant neoplasm of prostate: Secondary | ICD-10-CM | POA: Diagnosis not present

## 2018-04-30 ENCOUNTER — Encounter (HOSPITAL_COMMUNITY)
Admission: RE | Admit: 2018-04-30 | Discharge: 2018-04-30 | Disposition: A | Discharge status: Home / Self Care | Payer: Medicare Other | Source: Ambulatory Visit | Attending: Urology | Admitting: Urology

## 2018-04-30 DIAGNOSIS — C61 Malignant neoplasm of prostate: Secondary | ICD-10-CM

## 2018-04-30 MED ORDER — TECHNETIUM TC 99M MEDRONATE IV KIT
20.0000 | PACK | Freq: Once | INTRAVENOUS | Status: AC | PRN
  Administered 2018-04-30: 21.8 via INTRAVENOUS

## 2018-05-09 DIAGNOSIS — C61 Malignant neoplasm of prostate: Secondary | ICD-10-CM | POA: Diagnosis not present

## 2018-05-16 NOTE — Progress Notes (Signed)
HPI: FU atrial fibrillation. Pt withh/oAFib s/p ablation, thrombocytopenia, neuromuscular d/o, prostate CA and resection of mediastinal mass.He hashad 3 prior cardioversions and had been on long-term Coumadin.Previously had atrial fibrillation ablation at [facility] had had an excellent response and he had no recurrent atrial fibrillation until 4/16 postoperatively following mediastinal mass resection.Nuclear study 2005 with EF 48 and no ischemia or infarction. Last echo 3/16 showed normal LV function. Since last seen, the patient has dyspnea with more extreme activities but not with routine activities. It is relieved with rest. It is not associated with chest pain. There is no orthopnea, PND or pedal edema. There is no syncope or palpitations. There is no exertional chest pain.   Current Outpatient Medications  Medication Sig Dispense Refill  . enzalutamide (XTANDI) 40 MG capsule Take 160 mg by mouth daily.    . meloxicam (MOBIC) 15 MG tablet Take 7.5 mg by mouth 2 (two) times daily.      Current Facility-Administered Medications  Medication Dose Route Frequency Provider Last Rate Last Dose  . 0.9 %  sodium chloride infusion  500 mL Intravenous Continuous Irene Shipper, MD         Past Medical History:  Diagnosis Date  . Arthritis    fingers ,shoulder   . Atrial fibrillation ([place])   . Chronic respiratory failure with hypercapnia ([place]) 05/01/2015  . 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 Colon polyps    hyperplastic and tubular adenomatous  . Diverticulosis   . Dysrhythmia    hx at fib- ablation 8 yrs ago  . Erectile dysfunction following radical prostatectomy   . Hypercapnic respiratory failure, chronic ([place]) 05/01/2015  . Mediastinal mass    per CT CHEST 11/07/14 @ [facility].[name] KitchenANTERIOR...4.2 X 3.3 cm soft tissue  . Neuromuscular disorder ([place])   . Prostate cancer [facility])    prostate cancer  . Radiation    for  prostate cancer  . Radiation proctitis    rectum  . Recurrent laryngeal nerve palsy   . Respiratory failure ([facility])    s/p phrenic nerve palsy   . Thrombocytopenia ([place])   . 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 ATRIAL ABLATION SURGERY     for a fib x 5 years ago  . BACK SURGERY    . COLONOSCOPY    . CYSTOSCOPY N/A 11/26/2014   Procedure: CYSTOSCOPY FLEXIBLE;  Surgeon: Bjorn Loser, MD;  Location: [place];  Service: Urology;  Laterality: N/A;  . CYSTOSCOPY WITH URETHRAL DILATATION N/A 11/26/2014   Procedure: CYSTOSCOPY WITH URETHRAL DILATATION WITH INSERTION OF FOLEY;  Surgeon: Bjorn Loser, MD;  Location: [place];  Service: Urology;  Laterality: N/A;  . FLEXIBLE SIGMOIDOSCOPY  02/10/2012   Procedure: FLEXIBLE SIGMOIDOSCOPY;  Surgeon: Irene Shipper, MD;  [facility]: [facility];  Service: Endoscopy;  Laterality: N/A;  need APC   . KNEE ARTHROSCOPY Left   . LEAD REMOVAL N/A 11/26/2014   Procedure: CRYO INTERCOSTAL NERVE BLOCK;  Surgeon: Melrose Nakayama, MD;  Location: [place];  Service: Thoracic;  Laterality: N/A;  . POLYPECTOMY    . PROSTATE SURGERY    . RESECTION OF MEDIASTINAL MASS N/A 11/26/2014   Procedure: RESECTION OF ANTERIOR MEDIASTINAL MASS;  Surgeon: Melrose Nakayama, MD;  Location: [place];  Service: Thoracic;  Laterality: N/A;  . ROBOT ASSISTED LAPAROSCOPIC RADICAL PROSTATECTOMY  2009   radiation Tx  2011  . TONSILLECTOMY    . VASECTOMY    . VIDEO ASSISTED THORACOSCOPY Left 11/26/2014   Procedure: VIDEO ASSISTED THORACOSCOPY;  Surgeon: Melrose Nakayama, MD;  Location: [place];  Service: Thoracic;  Laterality: Left;    Social History   Socioeconomic History  . Marital status: Married    Spouse name: Not on file  . Number of children: 4  . Years of education: Not on file  . Highest education level: Not on file  Occupational History  . Occupation: orhodontist  Social Needs  . Financial  resource strain: Not on file  . Food insecurity:    Worry: Not on file    Inability: Not on file  . Transportation needs:    Medical: Not on file    Non-medical: Not on file  Tobacco Use  . Smoking status: Never Smoker  . Smokeless tobacco: Never Used  Substance and Sexual Activity  . Alcohol use: No    Alcohol/week: 0.0 standard drinks  . Drug use: No  . Sexual activity: Never  Lifestyle  . Physical activity:    Days per week: Not on file    Minutes per session: Not on file  . Stress: Not on file  Relationships  . Social connections:    Talks on phone: Not on file    Gets together: Not on file    Attends religious service: Not on file    Active member of club or organization: Not on file    Attends meetings of clubs or organizations: Not on file    Relationship status: Not on file  . Intimate partner violence:    Fear of current or ex partner: Not on file    Emotionally abused: Not on file    Physically abused: Not on file    Forced sexual activity: Not on file  Other Topics Concern  . Not on file  Social History Narrative  . Not on file    Family History  Problem Relation Age of Onset  . Prostate cancer Father   . Heart disease Mother   . Heart attack Mother   . Healthy Sister   . Healthy Sister   . Colon cancer Neg Hx   . Esophageal cancer Neg Hx   . Rectal cancer Neg Hx   . Stomach cancer Neg Hx   . Pancreatic cancer Neg Hx     ROS: no fevers or chills, productive cough, hemoptysis, dysphasia, odynophagia, melena, hematochezia, dysuria, hematuria, rash, seizure activity, orthopnea, PND, pedal edema, claudication. Remaining systems are negative.  Physical Exam: Well-developed well-nourished in no acute distress.  Skin is warm and dry.  HEENT is normal.  Neck is supple.  Chest is clear to auscultation with normal expansion.  Cardiovascular exam is regular rate and rhythm.  Abdominal exam nontender or distended. No masses palpated. Extremities show no  edema. neuro grossly intact  ECG-normal sinus rhythm at a rate of 63.  RV conduction delay.  No significant ST changes.  Personally reviewed  A/P  1 paroxysmal atrial fibrillation-patient is in sinus rhythm status post ablation.  Anticoagulation was discontinued previously.  His metoprolol was also discontinued because it was felt to be contributing to dyspnea on exertion due to chronotropic incompetence.  2 prior resection of thymoma-he will need follow-up with thoracic surgery.  3 history of prostate cancer-status post resection.  Kirk Ruths, MD

## 2018-05-21 ENCOUNTER — Encounter: Payer: Self-pay | Admitting: Cardiology

## 2018-05-21 ENCOUNTER — Ambulatory Visit (INDEPENDENT_AMBULATORY_CARE_PROVIDER_SITE_OTHER): Payer: Medicare Other | Admitting: Cardiology

## 2018-05-21 VITALS — BP 108/64 | HR 63 | Ht 70.0 in | Wt 185.0 lb

## 2018-05-21 DIAGNOSIS — I480 Paroxysmal atrial fibrillation: Secondary | ICD-10-CM

## 2018-05-21 NOTE — Patient Instructions (Signed)
Medication Instructions: Your physician recommends that you continue on your current medications as directed.    If you need a refill on your cardiac medications before your next appointment, please call your pharmacy.   Labwork: None  Procedures/Testing: None  Follow-Up: Your physician wants you to follow-up in 1 year with Dr. Trellis Moment will receive a reminder letter in the mail two months in advance. If you don't receive a letter, please call our office at [phone] to schedule this follow-up appointment.   Special Instructions:    Thank you for choosing Heartcare at [facility]!!

## 2018-11-28 ENCOUNTER — Other Ambulatory Visit: Payer: Self-pay | Admitting: Urology

## 2018-11-28 ENCOUNTER — Other Ambulatory Visit (HOSPITAL_COMMUNITY): Payer: Self-pay | Admitting: Urology

## 2018-11-28 DIAGNOSIS — C61 Malignant neoplasm of prostate: Secondary | ICD-10-CM

## 2018-12-05 ENCOUNTER — Ambulatory Visit (HOSPITAL_COMMUNITY)
Admission: RE | Admit: 2018-12-05 | Discharge: 2018-12-05 | Disposition: A | Discharge status: Home / Self Care | Payer: Medicare Other | Source: Ambulatory Visit | Attending: Urology | Admitting: Urology

## 2018-12-05 ENCOUNTER — Other Ambulatory Visit: Payer: Self-pay

## 2018-12-05 DIAGNOSIS — C61 Malignant neoplasm of prostate: Secondary | ICD-10-CM | POA: Diagnosis present

## 2018-12-05 MED ORDER — TECHNETIUM TC 99M MEDRONATE IV KIT
21.7000 | PACK | Freq: Once | INTRAVENOUS | Status: AC
  Administered 2018-12-05: 21.7 via INTRAVENOUS

## 2019-02-25 ENCOUNTER — Other Ambulatory Visit: Payer: Self-pay

## 2019-02-25 ENCOUNTER — Other Ambulatory Visit: Payer: Self-pay | Admitting: Thoracic Surgery (Cardiothoracic Vascular Surgery)

## 2019-02-25 DIAGNOSIS — E328 Other diseases of thymus: Secondary | ICD-10-CM

## 2019-02-26 ENCOUNTER — Encounter: Payer: Self-pay | Admitting: Thoracic Surgery (Cardiothoracic Vascular Surgery)

## 2019-02-26 ENCOUNTER — Ambulatory Visit (INDEPENDENT_AMBULATORY_CARE_PROVIDER_SITE_OTHER): Payer: Medicare Other | Admitting: Thoracic Surgery (Cardiothoracic Vascular Surgery)

## 2019-02-26 ENCOUNTER — Ambulatory Visit
Admission: RE | Admit: 2019-02-26 | Discharge: 2019-02-26 | Disposition: A | Discharge status: Home / Self Care | Payer: Medicare Other | Source: Ambulatory Visit | Attending: Thoracic Surgery (Cardiothoracic Vascular Surgery) | Admitting: Thoracic Surgery (Cardiothoracic Vascular Surgery)

## 2019-02-26 ENCOUNTER — Other Ambulatory Visit: Payer: Self-pay

## 2019-02-26 VITALS — BP 114/72 | HR 69 | Temp 97.5°F | Resp 20 | Ht 70.0 in | Wt 192.0 lb

## 2019-02-26 DIAGNOSIS — Z9089 Acquired absence of other organs: Secondary | ICD-10-CM

## 2019-02-26 DIAGNOSIS — E328 Other diseases of thymus: Secondary | ICD-10-CM

## 2019-02-26 NOTE — Progress Notes (Signed)
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  I did a thymectomy via left VATS in April 2016.  Postoperatively he had left phrenic and recurrent nerve dysfunction.  He feels well.  He still gets short of breath walking up an incline or stairs.  He has been able to play golf, but typically rides in a cart.  His prostate cancer has remained controlled.  Past Medical History:  Diagnosis Date  . Arthritis    fingers ,shoulder   . Atrial fibrillation ([place])   . Chronic respiratory failure with hypercapnia ([place]) 05/01/2015  . 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 Colon polyps    hyperplastic and tubular adenomatous  . Diverticulosis   . Dysrhythmia    hx at fib- ablation 8 yrs ago  . Erectile dysfunction following radical prostatectomy   . Hypercapnic respiratory failure, chronic ([place]) 05/01/2015  . Mediastinal mass    per CT CHEST 11/07/14 @ [facility].[name] KitchenANTERIOR...4.2 X 3.3 cm soft tissue  . Neuromuscular disorder ([place])   . Prostate cancer [facility])    prostate cancer  . Radiation    for prostate cancer  . Radiation proctitis    rectum  . Recurrent laryngeal nerve palsy   . Respiratory failure ([facility])    s/p phrenic nerve palsy   . Thrombocytopenia ([place])   . 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 enzalutamide (XTANDI) 40 MG capsule Take 160 mg by mouth daily.    . meloxicam (MOBIC) 15 MG tablet Take 7.5 mg by mouth 2 (two) times daily.      Current Facility-Administered Medications  Medication Dose Route Frequency Provider Last Rate Last Dose  . 0.9 %  sodium chloride infusion  500 mL  Intravenous Continuous Irene Shipper, MD        Physical Exam BP 114/72   Pulse 69   Temp (!) 97.5 F (36.4 C) (Skin)   Resp 20   Ht 5\' 10"  (1.778 m)   Wt 192 lb (87.1 kg)   SpO2 97% Comment: RA  BMI 27.69 kg/m  78 year old man in no acute distress Alert and oriented x3 with no focal deficits Lungs clear with essentially equal breath sounds bilaterally Cardiac regular rate and rhythm  Diagnostic Tests: CHEST - 2 VIEW  COMPARISON:  Chest x-ray 02/27/2018  FINDINGS: The cardiac silhouette, mediastinal and hilar contours are normal and stable. The lungs are clear. No pleural effusion. No pulmonary nodules. The bony thorax is intact.  IMPRESSION: Normal and stable appearance of the chest.   Electronically Signed   By: Marijo Sanes M.D.   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  That was complicated by phrenic and recurrent nerve dysfunction.  His chest x-ray has improved significantly over time as have the breath sounds in the left base, but his respiratory  capacity remains decreased.  He particular notices when he is walking up hills or an incline.  At this point I will think there is no be any significant improvement with that.  Prostate cancer remains under control with Xtandi-managed by Dr. Alinda Money  Plan: Return in 1 year with PA and lateral chest x-ray  Melrose Nakayama, MD [facility] [phone]

## 2019-03-25 ENCOUNTER — Telehealth: Payer: Self-pay | Admitting: *Deleted

## 2019-03-25 NOTE — Telephone Encounter (Signed)
A message was left, re: follow up visit. 

## 2019-05-17 ENCOUNTER — Other Ambulatory Visit (HOSPITAL_COMMUNITY): Payer: Self-pay | Admitting: Urology

## 2019-05-17 ENCOUNTER — Other Ambulatory Visit: Payer: Self-pay | Admitting: Urology

## 2019-05-17 DIAGNOSIS — C61 Malignant neoplasm of prostate: Secondary | ICD-10-CM

## 2019-05-27 ENCOUNTER — Encounter (HOSPITAL_COMMUNITY): Payer: Medicare Other

## 2019-05-28 ENCOUNTER — Encounter (HOSPITAL_COMMUNITY): Payer: Medicare Other

## 2019-05-28 ENCOUNTER — Encounter (HOSPITAL_COMMUNITY): Payer: Self-pay

## 2019-05-28 ENCOUNTER — Encounter (HOSPITAL_COMMUNITY): Admission: RE | Admit: 2019-05-28 | Payer: Medicare Other | Source: Ambulatory Visit

## 2019-06-18 NOTE — Progress Notes (Signed)
Virtual Visit via Video Note   This visit type was conducted due to national recommendations for restrictions regarding the COVID-19 Pandemic (e.g. social distancing) in an effort to limit this patient's exposure and mitigate transmission in our community.  Due to her co-morbid illnesses, this patient is at least at moderate risk for complications without adequate follow up.  This format is felt to be most appropriate for this patient at this time.  All issues noted in this document were discussed and addressed.  A limited physical exam was performed with this format.  Please refer to the patient's chart for her consent to telehealth for [facility].   HPI: FU atrial fibrillation. Pt withh/oAFib s/p ablation, thrombocytopenia, neuromuscular d/o, prostate CA and resection of mediastinal mass.He hashad 3 prior cardioversions and had been on long-term Coumadin.Previously had atrial fibrillation ablation at [facility] had had an excellent response and he had no recurrent atrial fibrillation until 4/16 postoperatively following mediastinal mass resection.Nuclear study 2005 with EF 48 and no ischemia or infarction. Last echo 3/16 showed normal LV function. Since last seen,  Current Outpatient Medications  Medication Sig Dispense Refill  . enzalutamide (XTANDI) 40 MG capsule Take 160 mg by mouth daily.    . meloxicam (MOBIC) 15 MG tablet Take 7.5 mg by mouth 2 (two) times daily.      Current Facility-Administered Medications  Medication Dose Route Frequency Provider Last Rate Last Dose  . 0.9 %  sodium chloride infusion  500 mL Intravenous Continuous Irene Shipper, MD         Past Medical History:  Diagnosis Date  . Arthritis    fingers ,shoulder   . Atrial fibrillation ([place])   . Chronic respiratory failure with hypercapnia ([place]) 05/01/2015  . 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 Colon polyps    hyperplastic and tubular adenomatous  .  Diverticulosis   . Dysrhythmia    hx at fib- ablation 8 yrs ago  . Erectile dysfunction following radical prostatectomy   . Hypercapnic respiratory failure, chronic ([place]) 05/01/2015  . Mediastinal mass    per CT CHEST 11/07/14 @ [facility].[name] KitchenANTERIOR...4.2 X 3.3 cm soft tissue  . Neuromuscular disorder ([place])   . Prostate cancer [facility])    prostate cancer  . Radiation    for prostate cancer  . Radiation proctitis    rectum  . Recurrent laryngeal nerve palsy   . Respiratory failure ([facility])    s/p phrenic nerve palsy   . Thrombocytopenia ([place])   . 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 ATRIAL ABLATION SURGERY     for a fib x 5 years ago  . BACK SURGERY    . COLONOSCOPY    . CYSTOSCOPY N/A 11/26/2014   Procedure: CYSTOSCOPY FLEXIBLE;  Surgeon: Bjorn Loser, MD;  Location: [place];  Service: Urology;  Laterality: N/A;  . CYSTOSCOPY WITH URETHRAL DILATATION N/A 11/26/2014   Procedure: CYSTOSCOPY WITH URETHRAL DILATATION WITH INSERTION OF FOLEY;  Surgeon: Bjorn Loser, MD;  Location: [place];  Service: Urology;  Laterality: N/A;  . FLEXIBLE SIGMOIDOSCOPY  02/10/2012   Procedure: FLEXIBLE SIGMOIDOSCOPY;  Surgeon: Irene Shipper, MD;  [facility];  Service: Endoscopy;  Laterality: N/A;  need APC   . KNEE ARTHROSCOPY Left   . LEAD REMOVAL N/A 11/26/2014   Procedure: CRYO INTERCOSTAL NERVE BLOCK;  Surgeon: Melrose Nakayama, MD;  [facility];  Service: Thoracic;  Laterality: N/A;  . POLYPECTOMY    . PROSTATE SURGERY    . RESECTION OF MEDIASTINAL MASS N/A 11/26/2014   Procedure: RESECTION OF ANTERIOR MEDIASTINAL MASS;  Surgeon: Melrose Nakayama, MD;  Location: [place];  Service: Thoracic;  Laterality: N/A;  . ROBOT ASSISTED LAPAROSCOPIC RADICAL PROSTATECTOMY  2009   radiation Tx 2011  . TONSILLECTOMY    . VASECTOMY    . VIDEO ASSISTED THORACOSCOPY Left 11/26/2014   Procedure: VIDEO ASSISTED  THORACOSCOPY;  Surgeon: Melrose Nakayama, MD;  Location: [place];  Service: Thoracic;  Laterality: Left;    Social History   Socioeconomic History  . Marital status: Married    Spouse name: Not on file  . Number of children: 4  . Years of education: Not on file  . Highest education level: Not on file  Occupational History  . Occupation: orhodontist  Social Needs  . Financial resource strain: Not on file  . Food insecurity    Worry: Not on file    Inability: Not on file  . Transportation needs    Medical: Not on file    Non-medical: Not on file  Tobacco Use  . Smoking status: Never Smoker  . Smokeless tobacco: Never Used  Substance and Sexual Activity  . Alcohol use: No    Alcohol/week: 0.0 standard drinks  . Drug use: No  . Sexual activity: Never  Lifestyle  . Physical activity    Days per week: Not on file    Minutes per session: Not on file  . Stress: Not on file  Relationships  . Social Herbalist on phone: Not on file    Gets together: Not on file    Attends religious service: Not on file    Active member of club or organization: Not on file    Attends meetings of clubs or organizations: Not on file    Relationship status: Not on file  . Intimate partner violence    Fear of current or ex partner: Not on file    Emotionally abused: Not on file    Physically abused: Not on file    Forced sexual activity: Not on file  Other Topics Concern  . Not on file  Social History Narrative  . Not on file    Family History  Problem Relation Age of Onset  . Prostate cancer Father   . Heart disease Mother   . Heart attack Mother   . Healthy Sister   . Healthy Sister   . Colon cancer Neg Hx   . Esophageal cancer Neg Hx   . Rectal cancer Neg Hx   . Stomach cancer Neg Hx   . Pancreatic cancer Neg Hx     ROS: no fevers or chills, productive cough, hemoptysis, dysphasia, odynophagia, melena, hematochezia, dysuria, hematuria, rash, seizure activity,  orthopnea, PND, pedal edema, claudication. Remaining systems are negative.  Physical Exam: Well-developed well-nourished in no acute distress.  Skin is warm and dry.  HEENT is normal.  Neck is supple.  Chest is clear to auscultation with normal expansion.  Cardiovascular exam is regular rate and rhythm.  Abdominal exam nontender or distended. No masses palpated. Extremities show no edema. neuro grossly intact  A/P  1 paroxysmal atrial fibrillation-patient has had previous ablation and remains in sinus.  Beta-blocker was discontinued previously because of contribution to chronotropic incompetence.  Anticoagulation also previously discontinued.  No recurrences.  2 prior resection of thymoma-follow-up thoracic  surgery.  Kirk Ruths, MD  18 minutes spent with pt today

## 2019-06-25 ENCOUNTER — Telehealth (INDEPENDENT_AMBULATORY_CARE_PROVIDER_SITE_OTHER): Payer: Medicare Other | Admitting: Cardiology

## 2019-06-25 VITALS — Ht 70.0 in | Wt 185.0 lb

## 2019-06-25 DIAGNOSIS — I480 Paroxysmal atrial fibrillation: Secondary | ICD-10-CM | POA: Diagnosis not present

## 2019-06-25 NOTE — Patient Instructions (Signed)
Medication Instructions:  Continue same medications If you need a refill on your cardiac medications before your next appointment, please call your pharmacy.   Lab work: None ordered   Testing/Procedures: None ordered  Follow-Up: At [facility], you and your health needs are our priority.  As part of our continuing mission to provide you with exceptional heart care, we have created designated Provider Care Teams.  These Care Teams include your primary Cardiologist (physician) and Advanced Practice Providers (APPs -  Physician Assistants and Nurse Practitioners) who all work together to provide you with the care you need, when you need it. . Schedule follow up appointment in 1 year   Call in July to schedule Oct appointment

## 2019-07-02 ENCOUNTER — Encounter (HOSPITAL_COMMUNITY): Payer: Medicare Other

## 2019-07-02 ENCOUNTER — Other Ambulatory Visit (HOSPITAL_COMMUNITY): Payer: Medicare Other

## 2019-07-09 ENCOUNTER — Other Ambulatory Visit: Payer: Self-pay

## 2019-07-09 ENCOUNTER — Encounter (HOSPITAL_COMMUNITY)
Admission: RE | Admit: 2019-07-09 | Discharge: 2019-07-09 | Disposition: A | Discharge status: Home / Self Care | Payer: Self-pay | Source: Ambulatory Visit | Attending: Urology | Admitting: Urology

## 2019-07-09 ENCOUNTER — Encounter (HOSPITAL_COMMUNITY)
Admission: RE | Admit: 2019-07-09 | Discharge: 2019-07-09 | Disposition: A | Discharge status: Home / Self Care | Payer: Medicare Other | Source: Ambulatory Visit | Attending: Urology | Admitting: Urology

## 2019-07-09 DIAGNOSIS — C61 Malignant neoplasm of prostate: Secondary | ICD-10-CM | POA: Insufficient documentation

## 2019-07-09 MED ORDER — FLUDEOXYGLUCOSE F - 18 (FDG) INJECTION
20.3000 | Freq: Once | INTRAVENOUS | Status: AC | PRN
  Administered 2019-07-09: 20.3 via INTRAVENOUS

## 2019-07-24 ENCOUNTER — Telehealth: Payer: Self-pay | Admitting: Cardiology

## 2019-07-24 NOTE — Telephone Encounter (Signed)
Left message to call back  

## 2019-07-24 NOTE — Telephone Encounter (Signed)
Patient had a procedure done at [facility]. At that time, he had CT of his abdomen with contrast. 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 Patient can confirm diagnosis on his copy of paperwork.   Patient wanted to know if any of that information should concern him, and if Dr. Stanford Breed could look at any old scans he has on record to confirm this information.

## 2019-07-25 ENCOUNTER — Telehealth: Payer: Self-pay | Admitting: Cardiology

## 2019-07-25 NOTE — Telephone Encounter (Signed)
Phone: [phone] [facility] medical records will fax results

## 2019-07-25 NOTE — Telephone Encounter (Signed)
New Message     Pt is calling for results from a CT scan     Please call back

## 2019-07-25 NOTE — Telephone Encounter (Signed)
returned call to pt he states that he is just waiting for CT to be faxed for Dr Stanford Breed to review

## 2019-07-25 NOTE — Telephone Encounter (Signed)
Will review CT when available Kirk Ruths

## 2019-07-25 NOTE — Telephone Encounter (Signed)
Will route to primary nurse to make aware to be on look out for CT results.

## 2019-07-25 NOTE — Telephone Encounter (Signed)
Spoke with pt, aware dr Stanford Breed will not review CT until next week.

## 2019-08-13 MED ORDER — ASPIRIN EC 81 MG PO TBEC: 81.0000 mg | DELAYED_RELEASE_TABLET | Freq: Every day | ORAL | 3 refills | Status: DC

## 2019-08-13 NOTE — Telephone Encounter (Signed)
Left message for pt to call, after review of the CT scan dr Stanford Breed recommends the patient start aspirin 81 mg once daily. Also, he needs to start crestor 20 mg once daily with lipid and liver check in 12 weeks.

## 2019-08-13 NOTE — Telephone Encounter (Signed)
Spoke with pt, Aware of dr Jacalyn Lefevre recommendations. He is willing to start the aspirin but wants to discuss cholesterol medications prior to starting. Virtual office visit scheduled with dr Stanford Breed to discuss.

## 2019-09-24 ENCOUNTER — Telehealth: Payer: Medicare Other | Admitting: Cardiology

## 2019-09-24 ENCOUNTER — Other Ambulatory Visit: Payer: Self-pay | Admitting: Urology

## 2019-09-24 DIAGNOSIS — C61 Malignant neoplasm of prostate: Secondary | ICD-10-CM

## 2019-11-05 DIAGNOSIS — M5459 Other low back pain: Secondary | ICD-10-CM | POA: Insufficient documentation

## 2019-11-28 DIAGNOSIS — M25562 Pain in left knee: Secondary | ICD-10-CM | POA: Insufficient documentation

## 2019-12-12 ENCOUNTER — Encounter (HOSPITAL_COMMUNITY): Payer: Medicare Other

## 2019-12-12 ENCOUNTER — Encounter (HOSPITAL_COMMUNITY): Payer: Self-pay

## 2020-02-25 ENCOUNTER — Ambulatory Visit: Payer: Medicare Other | Admitting: Thoracic Surgery (Cardiothoracic Vascular Surgery)

## 2020-03-05 ENCOUNTER — Other Ambulatory Visit: Payer: Self-pay | Admitting: Urology

## 2020-03-13 ENCOUNTER — Other Ambulatory Visit: Payer: Self-pay | Admitting: Thoracic Surgery (Cardiothoracic Vascular Surgery)

## 2020-03-13 DIAGNOSIS — J9859 Other diseases of mediastinum, not elsewhere classified: Secondary | ICD-10-CM

## 2020-03-17 ENCOUNTER — Other Ambulatory Visit: Payer: Self-pay

## 2020-03-17 ENCOUNTER — Ambulatory Visit
Admission: RE | Admit: 2020-03-17 | Discharge: 2020-03-17 | Disposition: A | Discharge status: Home / Self Care | Payer: Medicare Other | Source: Ambulatory Visit | Attending: Thoracic Surgery (Cardiothoracic Vascular Surgery) | Admitting: Thoracic Surgery (Cardiothoracic Vascular Surgery)

## 2020-03-17 ENCOUNTER — Ambulatory Visit (INDEPENDENT_AMBULATORY_CARE_PROVIDER_SITE_OTHER): Payer: Medicare Other | Admitting: Thoracic Surgery (Cardiothoracic Vascular Surgery)

## 2020-03-17 VITALS — BP 113/74 | HR 69 | Temp 97.6°F | Resp 20 | Ht 70.0 in | Wt 197.0 lb

## 2020-03-17 DIAGNOSIS — E328 Other diseases of thymus: Secondary | ICD-10-CM

## 2020-03-17 DIAGNOSIS — J9859 Other diseases of mediastinum, not elsewhere classified: Secondary | ICD-10-CM

## 2020-03-17 NOTE — Progress Notes (Signed)
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 old man with a history of prostate cancer.  He was found to have an anterior mediastinal mass on a CT done as part of the metastatic work-up.  He had a minimally invasive thymectomy in April 2016.  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  His exercise tolerance initially was severely limited.  It did improve but never completely back to his preoperative baseline.  He is able to play golf but is not able to walk up stairs or hike.  He is now off of enzalutamide and his prostate cancer is in remission.  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   s/p THYMECTOMY    Colon polyps    hyperplastic and tubular adenomatous   Diverticulosis    Dysrhythmia    hx at fib- ablation 8 yrs ago   Erectile dysfunction following radical prostatectomy    Hypercapnic respiratory failure, chronic ([place]) 05/01/2015   Mediastinal mass    per CT CHEST 11/07/14 @ [facility].[name] KitchenANTERIOR...4.2 X 3.3 cm soft tissue   Neuromuscular disorder (HCC)    Prostate cancer [facility])    prostate cancer   Radiation    for prostate cancer   Radiation proctitis    rectum   Recurrent laryngeal nerve palsy    Respiratory failure (HCC)    s/p phrenic nerve palsy    Thrombocytopenia (HCC)    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 90 tablet 3   meloxicam (MOBIC) 15 MG tablet Take 7.5 mg by mouth 2 (two) times daily.      Current  Facility-Administered Medications  Medication Dose Route Frequency Provider Last Rate Last Admin   0.9 %  sodium chloride infusion  500 mL Intravenous Continuous Irene Shipper, MD        Physical Exam BP 113/74    Pulse 69    Temp 97.6 F (36.4 C) (Skin)    Resp 20    Ht 5\' 10"  (1.778 m)    Wt 197 lb (89.4 kg)    SpO2 95% Comment: RA   BMI 28.30 kg/m   79 year old man in no acute distress Alert and oriented x3 with no focal neurologic deficit distress Alert and oriented x3 with no focal neurologic deficit Cardiac regular rate and rhythm normal S1 and S2 Lungs clear bilaterally  Diagnostic Tests: CHEST - 2 VIEW  COMPARISON:  02/26/2019  FINDINGS: Postsurgical changes within the anterior mediastinum. The heart size and mediastinal contours are within normal limits. Both lungs are clear. Degenerative changes within the thoracic spine.  IMPRESSION: No active cardiopulmonary disease. Stable postsurgical appearance of the chest status post mediastinal mass resection.   Electronically Signed   By: Davina Poke D.O.   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 old gentleman with a history of prostate cancer, coronary atherosclerosis on  CT, atrial fibrillation, and a cystic thymoma resected in 2016.  He is now 78 years out from resection. resection.  There is no sign of mediastinal widening on his chest x-ray.  Benign lesion.  No reason to suspect recurrence.  Left phrenic nerve dysfunction/left recurrent nerve dysfunction-his voice is better.  He still has limitations with physical exertion.  Primarily cannot walk uphill or upstairs without getting short of breath.  Does well with walking on level ground.  Chest x-ray unchanged with possible slight residual elevation of left hemidiaphragm but markedly improved from initial postop studies.  Coronary calcification on CT scan-noted back in October.  He is not having any anginal symptoms.  He is  aware of the finding and that is very common among his age group.  Discussed the possibility of doing a CT for calcium score but he does not want to pursue that at this time.  Plan: Return in 1 year with PA lateral chest x-ray   Melrose Nakayama, MD [facility] [phone]

## 2020-06-24 DIAGNOSIS — I700 Atherosclerosis of aorta: Secondary | ICD-10-CM | POA: Insufficient documentation

## 2020-06-24 DIAGNOSIS — M961 Postlaminectomy syndrome, not elsewhere classified: Secondary | ICD-10-CM | POA: Insufficient documentation

## 2020-07-23 NOTE — Progress Notes (Signed)
HPI: FU atrial fibrillation. Pt withh/oAFib s/p ablation, thrombocytopenia, neuromuscular d/o, prostate CA and resection of mediastinal mass.He hashad 3 prior cardioversions and had been on long-term Coumadin.Previously had atrial fibrillation ablation at [facility] had had an excellent response and he had no recurrent atrial fibrillation until 4/16 postoperatively following mediastinal mass resection.Nuclear study 2005 with EF 48 and no ischemia or infarction. Last echo 3/16 showed normal LV function. Since last seen,patient denies dyspnea, chest pain, palpitations or syncope.  Current Outpatient Medications  Medication Sig Dispense Refill  . meloxicam (MOBIC) 15 MG tablet Take 7.5 mg by mouth 2 (two) times daily.      Current Facility-Administered Medications  Medication Dose Route Frequency Provider Last Rate Last Admin  . 0.9 %  sodium chloride infusion  500 mL Intravenous Continuous Irene Shipper, MD         Past Medical History:  Diagnosis Date  . Arthritis    fingers ,shoulder   . Atrial fibrillation ([place])   . Chronic respiratory failure with hypercapnia ([place]) 05/01/2015  . 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 Colon polyps    hyperplastic and tubular adenomatous  . Diverticulosis   . Dysrhythmia    hx at fib- ablation 8 yrs ago  . Erectile dysfunction following radical prostatectomy   . Hypercapnic respiratory failure, chronic ([place]) 05/01/2015  . Mediastinal mass    per CT CHEST 11/07/14 @ [facility].[name] KitchenANTERIOR...4.2 X 3.3 cm soft tissue  . Neuromuscular disorder ([place])   . Prostate cancer [facility])    prostate cancer  . Radiation    for prostate cancer  . Radiation proctitis    rectum  . Recurrent laryngeal nerve palsy   . Respiratory failure (HCC)    s/p phrenic nerve palsy   . Thrombocytopenia ([place])   . 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 ATRIAL ABLATION SURGERY     for a fib x 5 years ago  . BACK SURGERY    . COLONOSCOPY    . CYSTOSCOPY N/A 11/26/2014   Procedure: CYSTOSCOPY FLEXIBLE;  Surgeon: Bjorn Loser, MD;  Location: [place];  Service: Urology;  Laterality: N/A;  . CYSTOSCOPY WITH URETHRAL DILATATION N/A 11/26/2014   Procedure: CYSTOSCOPY WITH URETHRAL DILATATION WITH INSERTION OF FOLEY;  Surgeon: Bjorn Loser, MD;  Location: [place];  Service: Urology;  Laterality: N/A;  . FLEXIBLE SIGMOIDOSCOPY  02/10/2012   Procedure: FLEXIBLE SIGMOIDOSCOPY;  Surgeon: Irene Shipper, MD;  [facility];  Service: Endoscopy;  Laterality: N/A;  need APC   . KNEE ARTHROSCOPY Left   . LEAD REMOVAL N/A 11/26/2014   Procedure: CRYO INTERCOSTAL NERVE BLOCK;  Surgeon: Melrose Nakayama, MD;  Location: [place];  Service: Thoracic;  Laterality: N/A;  . POLYPECTOMY    . PROSTATE SURGERY    . RESECTION OF MEDIASTINAL MASS N/A 11/26/2014   Procedure: RESECTION OF ANTERIOR MEDIASTINAL MASS;  Surgeon: Melrose Nakayama, MD;  Location: [place];  Service: Thoracic;  Laterality: N/A;  . ROBOT ASSISTED LAPAROSCOPIC RADICAL PROSTATECTOMY  2009   radiation Tx 2011  . TONSILLECTOMY    . VASECTOMY    . VIDEO ASSISTED THORACOSCOPY Left 11/26/2014   Procedure: VIDEO ASSISTED THORACOSCOPY;  Surgeon: Melrose Nakayama, MD;  Location: [place];  Service: Thoracic;  Laterality: Left;    Social History   Socioeconomic History  . Marital status: Married  Spouse name: Not on file  . Number of children: 4  . Years of education: Not on file  . Highest education level: Not on file  Occupational History  . Occupation: orhodontist  Tobacco Use  . Smoking status: Never Smoker  . Smokeless tobacco: Never Used  Vaping Use  . Vaping Use: Never used  Substance and Sexual Activity  . Alcohol use: No    Alcohol/week: 0.0 standard drinks  . Drug use: No  . Sexual activity: Never  Other Topics Concern  . Not on file  Social History  Narrative  . Not on file   Social Determinants of Health   Financial Resource Strain:   . Difficulty of Paying Living Expenses: Not on file  Food Insecurity:   . Worried About Charity fundraiser in the Last Year: Not on file  . Ran Out of Food in the Last Year: Not on file  Transportation Needs:   . Lack of Transportation (Medical): Not on file  . Lack of Transportation (Non-Medical): Not on file  Physical Activity:   . Days of Exercise per Week: Not on file  . Minutes of Exercise per Session: Not on file  Stress:   . Feeling of Stress : Not on file  Social Connections:   . Frequency of Communication with Friends and Family: Not on file  . Frequency of Social Gatherings with Friends and Family: Not on file  . Attends Religious Services: Not on file  . Active Member of Clubs or Organizations: Not on file  . Attends Archivist Meetings: Not on file  . Marital Status: Not on file  Intimate Partner Violence:   . Fear of Current or Ex-Partner: Not on file  . Emotionally Abused: Not on file  . Physically Abused: Not on file  . Sexually Abused: Not on file    Family History  Problem Relation Age of Onset  . Prostate cancer Father   . Heart disease Mother   . Heart attack Mother   . Healthy Sister   . Healthy Sister   . Colon cancer Neg Hx   . Esophageal cancer Neg Hx   . Rectal cancer Neg Hx   . Stomach cancer Neg Hx   . Pancreatic cancer Neg Hx     ROS: no fevers or chills, productive cough, hemoptysis, dysphasia, odynophagia, melena, hematochezia, dysuria, hematuria, rash, seizure activity, orthopnea, PND, pedal edema, claudication. Remaining systems are negative.  Physical Exam: Well-developed well-nourished in no acute distress.  Skin is warm and dry.  HEENT is normal.  Neck is supple.  Chest is clear to auscultation with normal expansion.  Cardiovascular exam is regular rate and rhythm.  Abdominal exam nontender or distended. No masses  palpated. Extremities show no edema. neuro grossly intact  ECG-sinus rhythm at a rate of 66, nonspecific ST changes.  Personally reviewed  A/P  1 paroxysmal atrial fibrillation-patient is status post ablation and remains in sinus rhythm.  He has had no recurrences.  Anticoagulation previously discontinued.  Beta-blocker also discontinued as it was felt to be contributing to chronotropic incompetence.  2 history of thymoma resection-follow-up surgery.  Kirk Ruths, MD

## 2020-07-27 ENCOUNTER — Other Ambulatory Visit: Payer: Self-pay

## 2020-07-27 ENCOUNTER — Encounter: Payer: Self-pay | Admitting: Cardiology

## 2020-07-27 ENCOUNTER — Ambulatory Visit (INDEPENDENT_AMBULATORY_CARE_PROVIDER_SITE_OTHER): Payer: Medicare Other | Admitting: Cardiology

## 2020-07-27 VITALS — BP 130/62 | HR 66 | Ht 70.0 in | Wt 196.0 lb

## 2020-07-27 DIAGNOSIS — I480 Paroxysmal atrial fibrillation: Secondary | ICD-10-CM | POA: Diagnosis not present

## 2020-07-27 NOTE — Patient Instructions (Signed)

## 2021-02-03 ENCOUNTER — Other Ambulatory Visit (HOSPITAL_COMMUNITY): Payer: Self-pay | Admitting: Urology

## 2021-02-03 DIAGNOSIS — C61 Malignant neoplasm of prostate: Secondary | ICD-10-CM

## 2021-03-01 ENCOUNTER — Ambulatory Visit (HOSPITAL_COMMUNITY)
Admission: RE | Admit: 2021-03-01 | Discharge: 2021-03-01 | Disposition: A | Discharge status: Home / Self Care | Payer: Medicare Other | Source: Ambulatory Visit | Attending: Urology | Admitting: Urology

## 2021-03-01 ENCOUNTER — Other Ambulatory Visit: Payer: Self-pay

## 2021-03-01 ENCOUNTER — Ambulatory Visit (HOSPITAL_COMMUNITY): Payer: Medicare Other

## 2021-03-01 DIAGNOSIS — C61 Malignant neoplasm of prostate: Secondary | ICD-10-CM | POA: Insufficient documentation

## 2021-03-01 MED ORDER — PIFLIFOLASTAT F 18 (PYLARIFY) INJECTION
9.0000 | Freq: Once | INTRAVENOUS | Status: AC
  Administered 2021-03-01: 15:00:00 8.49 via INTRAVENOUS

## 2021-03-23 ENCOUNTER — Ambulatory Visit: Payer: Medicare Other | Admitting: Thoracic Surgery (Cardiothoracic Vascular Surgery)

## 2021-03-25 ENCOUNTER — Telehealth: Payer: Self-pay | Admitting: Radiation Oncology

## 2021-03-25 NOTE — Telephone Encounter (Signed)
Called patient to schedule consultation with Dr. Tammi Klippel. No answer, LVM for return call.

## 2021-03-30 NOTE — Progress Notes (Signed)
GU Location of Tumor / Histology: Prostate  If Prostate Cancer, Gleason Score is ( 3+ 7), PSA (3.67), and Prostate volume ()  [name] presented  months ago with signs/symptoms of:   Biopsies revealed:   Past/Anticipated interventions by urology, if any:   Past/Anticipated interventions by medical oncology, if any:   Weight changes, if any: no  IPSS Score: 17 SHIM Score:5  Bowel/Bladder complaints, if any: no   Nausea/Vomiting, if any: no  Pain issues, if any:  no  SAFETY ISSUES: Prior radiation? 2017 prostate cancer Pacemaker/ICD? no Possible current pregnancy? no Is the patient on methotrexate? no  Current Complaints / other details:   Vitals:   04/06/21 0808  BP: 113/64  Pulse: 70  Resp: 20  Temp: 97.7 F (36.5 C)  SpO2: 99%  Weight: 87.8 kg  Height: 5\' 10"  (1.778 m)

## 2021-04-05 ENCOUNTER — Other Ambulatory Visit: Payer: Self-pay | Admitting: Thoracic Surgery (Cardiothoracic Vascular Surgery)

## 2021-04-05 DIAGNOSIS — D4989 Neoplasm of unspecified behavior of other specified sites: Secondary | ICD-10-CM

## 2021-04-05 DIAGNOSIS — C775 Secondary and unspecified malignant neoplasm of intrapelvic lymph nodes: Secondary | ICD-10-CM | POA: Insufficient documentation

## 2021-04-05 DIAGNOSIS — C61 Malignant neoplasm of prostate: Secondary | ICD-10-CM | POA: Insufficient documentation

## 2021-04-05 NOTE — Progress Notes (Signed)
Note from [place] in Rad-Onc  Summary and display of previous radiotherapy.

## 2021-04-06 ENCOUNTER — Encounter: Payer: Self-pay | Admitting: Radiation Oncology

## 2021-04-06 ENCOUNTER — Ambulatory Visit
Admission: RE | Admit: 2021-04-06 | Discharge: 2021-04-06 | Disposition: A | Discharge status: Home / Self Care | Payer: Medicare Other | Source: Ambulatory Visit | Attending: Radiation Oncology | Admitting: Radiation Oncology

## 2021-04-06 ENCOUNTER — Ambulatory Visit (INDEPENDENT_AMBULATORY_CARE_PROVIDER_SITE_OTHER): Payer: Medicare Other | Admitting: Thoracic Surgery (Cardiothoracic Vascular Surgery)

## 2021-04-06 ENCOUNTER — Other Ambulatory Visit: Payer: Self-pay

## 2021-04-06 ENCOUNTER — Ambulatory Visit
Admission: RE | Admit: 2021-04-06 | Discharge: 2021-04-06 | Disposition: A | Discharge status: Home / Self Care | Payer: Medicare Other | Source: Ambulatory Visit | Attending: Thoracic Surgery (Cardiothoracic Vascular Surgery) | Admitting: Thoracic Surgery (Cardiothoracic Vascular Surgery)

## 2021-04-06 VITALS — BP 99/61 | HR 64 | Resp 20 | Ht 70.0 in | Wt 193.0 lb

## 2021-04-06 VITALS — BP 113/64 | HR 70 | Temp 97.7°F | Resp 20 | Ht 70.0 in | Wt 193.6 lb

## 2021-04-06 DIAGNOSIS — C61 Malignant neoplasm of prostate: Secondary | ICD-10-CM | POA: Diagnosis present

## 2021-04-06 DIAGNOSIS — D696 Thrombocytopenia, unspecified: Secondary | ICD-10-CM | POA: Diagnosis not present

## 2021-04-06 DIAGNOSIS — M129 Arthropathy, unspecified: Secondary | ICD-10-CM | POA: Diagnosis not present

## 2021-04-06 DIAGNOSIS — C775 Secondary and unspecified malignant neoplasm of intrapelvic lymph nodes: Secondary | ICD-10-CM

## 2021-04-06 DIAGNOSIS — N529 Male erectile dysfunction, unspecified: Secondary | ICD-10-CM | POA: Diagnosis not present

## 2021-04-06 DIAGNOSIS — Z86010 Personal history of colonic polyps: Secondary | ICD-10-CM | POA: Diagnosis not present

## 2021-04-06 DIAGNOSIS — J9612 Chronic respiratory failure with hypercapnia: Secondary | ICD-10-CM | POA: Diagnosis not present

## 2021-04-06 DIAGNOSIS — Z923 Personal history of irradiation: Secondary | ICD-10-CM | POA: Insufficient documentation

## 2021-04-06 DIAGNOSIS — Z7982 Long term (current) use of aspirin: Secondary | ICD-10-CM | POA: Diagnosis not present

## 2021-04-06 DIAGNOSIS — Z8042 Family history of malignant neoplasm of prostate: Secondary | ICD-10-CM | POA: Insufficient documentation

## 2021-04-06 DIAGNOSIS — I4891 Unspecified atrial fibrillation: Secondary | ICD-10-CM | POA: Diagnosis not present

## 2021-04-06 DIAGNOSIS — E328 Other diseases of thymus: Secondary | ICD-10-CM | POA: Diagnosis not present

## 2021-04-06 DIAGNOSIS — D4989 Neoplasm of unspecified behavior of other specified sites: Secondary | ICD-10-CM

## 2021-04-06 NOTE — Progress Notes (Signed)
Radiation Oncology         [phone] ________________________________  Outpatient Consultation  Name: [name] [name] MRN: [id number]  Date of Service: 04/06/2021 DOB: Sep 28, 1940  [id number], Maebelle Munroe, MD  Raynelle Bring, MD   REFERRING PHYSICIAN: Raynelle Bring, MD  DIAGNOSIS: 80 y.o. 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 Prostate cancer ([place])  C61     2. Prostate cancer metastatic to intrapelvic lymph node [facility])  C61    C77.5       HISTORY OF PRESENT ILLNESS: [name] [name], [name] [name] is a 80 y.o. male seen at the request of Dr. Alinda Money. He is s/p RALP on 09/29/2005 for pT2cN0Mx, Gleason 3+4 adenocarcinoma of the prostate.  There was a focally positive margin at the left posterior prostate but no seminal vesicle involvement or lymph node involvement. His pre-treatment PSA was 3.5 and post-operative PSA was 0.02. The PSA increased to 0.69 in 2011 so we met with him at that time and he elected to proceed with salvage radiation to the prostatic fossa, completed on 04/02/2010.  His PSA nadired at 0.10 in 10/2010 but began slowly rising thereafter. He was followed in active surveillance until 02/2015 when he enrolled in the EMBARK trial and randomized to the Enzalutamide alone arm. He had prophylactic breast radiation, completed 02/27/2016.  He was treated with enzalutamide through 10/2015, with undetectable PSA so he was placed on drug holiday. He resumed the enzalutamide in 07/2016 per study protocol for PSA greater than 2.0.  The PSA remained undetectable through 01/2020 when the study concluded.   The PSA again became low detectable, starting at 0.026 in 05/2020 and slowly rising until March 2022 when the PSA increased to 2.33 from 0.66 in 08/2020. His most recent PSA on 01/18/2021 was up to 3.67. This prompted PSMA scan on 03/01/21 showing signs of right-sided nodal recurrence in the pelvis following prostatectomy with  4 mm, 5 mm and 6 mm right pelvic sidewall nodes. There was no left pelvic sidewall activity and no evidence of skeletal metastases.    Dr. 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   s/p THYMECTOMY    Colon polyps    hyperplastic and tubular adenomatous   Diverticulosis    Dysrhythmia    hx at fib- ablation 8 yrs ago   Erectile dysfunction following radical prostatectomy    Hypercapnic respiratory failure, chronic ([place]) 05/01/2015   Mediastinal mass    per CT CHEST 11/07/14 @ [facility].[name] KitchenANTERIOR...4.2 X 3.3 cm soft tissue   Neuromuscular disorder (HCC)    Prostate cancer [facility])    prostate cancer   Radiation    for prostate cancer   Radiation proctitis    rectum   Recurrent laryngeal nerve palsy    Respiratory failure (HCC)    s/p phrenic nerve palsy    Thrombocytopenia (HCC)    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 11/26/14  PAST SURGICAL HISTORY: Past Surgical History:  Procedure Laterality Date   ATRIAL ABLATION SURGERY     for a fib x 5 years ago   [place] N/A 11/26/2014   Procedure: CYSTOSCOPY FLEXIBLE;  Surgeon: Bjorn Loser, MD;  Location: [place];  Service: Urology;  Laterality: N/A;   CYSTOSCOPY WITH URETHRAL DILATATION N/A 11/26/2014   Procedure: CYSTOSCOPY WITH URETHRAL DILATATION WITH INSERTION OF FOLEY;  Surgeon: Bjorn Loser, MD;  Location: [place];  Service: Urology;  Laterality: N/A;   FLEXIBLE  SIGMOIDOSCOPY  02/10/2012   Procedure: FLEXIBLE SIGMOIDOSCOPY;  Surgeon: Irene Shipper, MD;  [facility];  Service: Endoscopy;  Laterality: N/A;  need APC    KNEE ARTHROSCOPY Left    LEAD REMOVAL N/A 11/26/2014   Procedure: CRYO INTERCOSTAL NERVE BLOCK;  Surgeon: Melrose Nakayama, MD;  Location: [place];  Service: Thoracic;  Laterality: N/A;   POLYPECTOMY     PROSTATE SURGERY     RESECTION OF MEDIASTINAL MASS N/A 11/26/2014   Procedure: RESECTION OF ANTERIOR MEDIASTINAL MASS;  Surgeon: Melrose Nakayama, MD;  Location: [place] [facility];  Service: Thoracic;  Laterality: N/A;   ROBOT ASSISTED LAPAROSCOPIC RADICAL PROSTATECTOMY  2009   radiation Tx 2011   TONSILLECTOMY     VASECTOMY     VIDEO ASSISTED THORACOSCOPY Left 11/26/2014   Procedure: VIDEO ASSISTED THORACOSCOPY;  Surgeon: Melrose Nakayama, MD;  Location: [place];  Service: Thoracic;  Laterality: Left;    FAMILY HISTORY:  Family History  Problem Relation Age of Onset   Prostate cancer Father    Heart disease Mother    Heart attack Mother    Healthy Sister    Healthy Sister    Colon cancer Neg Hx    Esophageal cancer Neg Hx    Rectal cancer Neg Hx    Stomach cancer Neg Hx    Pancreatic cancer Neg Hx     SOCIAL HISTORY: Retired Clinical biochemist Social History   Socioeconomic History   Marital status: Married    Spouse name: Not on file   Number of children: 4   Years of education: Not on file   Highest education level: Not on file  Occupational History   Occupation: orhodontist  Tobacco Use   Smoking status: Never   Smokeless tobacco: Never  Vaping Use   Vaping Use: Never used  Substance and Sexual Activity   Alcohol use: No    Alcohol/week: 0.0 standard drinks   Drug use: No   Sexual activity: Never  Other Topics Concern   Not on file  Social History Narrative   Not on file   Social Determinants of Health   Financial Resource Strain: Not on file  Food Insecurity: Not on file  Transportation Needs: Not  on file  Physical Activity: Not on file  Stress: Not on file  Social Connections: Not on file  Intimate Partner Violence: Not on file    ALLERGIES: Patient has no known allergies.  MEDICATIONS:  Current Outpatient Medications  Medication Sig Dispense Refill   aspirin 81 MG EC tablet aspirin 81 mg tablet,delayed release  Take 1 tablet every day by oral route.     ergocalciferol (VITAMIN D2) 1.25 MG (50000 UT) capsule ergocalciferol (vitamin D2) 1,250 mcg (50,000 unit) capsule  TAKE 1 CAPSULE BY MOUTH TWICE A WEEK     meloxicam (MOBIC) 15 MG tablet Take 7.5 mg by mouth 2 (two) times daily.  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 He denies any chest pain, shortness of breath, cough, fevers, chills, night sweats, unintended weight changes. He denies any bowel or bladder disturbances, and denies abdominal pain, nausea or vomiting. He denies any new musculoskeletal or joint aches or pains. His IPSS score was 17, indicating moderate urinary symptoms. His SHIM was 5, indicating he likely has postoperative erectile dysfunction. A complete review of systems is obtained and is otherwise negative.    PHYSICAL EXAM:  Wt Readings from Last 3 Encounters:  04/06/21 193 lb (87.5 kg)  04/06/21 193 lb 9.6 oz (87.8 kg)  07/27/20 196 lb (88.9 kg)   Temp Readings from Last 3 Encounters:  04/06/21 97.7 F (36.5 C)  03/17/20 97.6 F (36.4 C) (Skin)  02/26/19 (!) 97.5 F (36.4 C) (Skin)   BP Readings from Last 3 Encounters:  04/06/21 99/61  04/06/21 113/64  07/27/20 130/62   Pulse Readings from Last 3 Encounters:  04/06/21 64  04/06/21 70  07/27/20 66   Pain Assessment Pain Score: 0-No pain/10  In general this is a well appearing Caucasian  male in no acute distress. He's alert and oriented x4 and appropriate throughout the examination. Cardiopulmonary assessment is negative for acute distress and he exhibits normal effort.     KPS = 100  100 - Normal; no complaints; no evidence of disease. 90   - Able to carry on normal activity; minor signs or symptoms of disease. 80   - Normal activity with effort; some signs or symptoms of disease. 18   - Cares for self; unable to carry on normal activity or to do active work. 60   - Requires occasional assistance, but is able to care for most of his personal needs. 50   - Requires considerable assistance and frequent medical care. 37   - Disabled; requires special care and assistance. 65   - Severely disabled; hospital admission is indicated although death not imminent. 27   - Very sick; hospital admission necessary; active supportive treatment necessary. 10   - Moribund; fatal processes progressing rapidly. 0     - Dead  Karnofsky DA, Abelmann [place], Craver LS and Burchenal [facility] [phone]) The use of the nitrogen mustards in the palliative treatment of carcinoma: with particular reference to bronchogenic carcinoma Cancer 1 634-56  LABORATORY DATA:  Lab Results  Component Value Date   WBC 11.3 (H) 11/28/2014   HGB 12.5 (L) 11/28/2014   HCT 38.0 (L) 11/28/2014   MCV 91.3 11/28/2014   PLT 163 11/28/2014   Lab Results  Component Value Date   NA 135 11/28/2014   K 3.8 11/28/2014   CL 100 11/28/2014   CO2 29 11/28/2014   Lab Results  Component Value Date   ALT 15 11/28/2014   AST 20 11/28/2014   ALKPHOS 57 11/28/2014   BILITOT 1.5 (H) 11/28/2014     RADIOGRAPHY: DG Chest 2 View  Result Date: 04/06/2021 CLINICAL DATA:  Thymoma, postop EXAM: CHEST - 2 VIEW COMPARISON:  03/17/2020 FINDINGS: Postoperative changes with clips in the mediastinum. Heart is normal size. Mediastinal contours within normal limits. Lungs are clear. No effusions or acute bony abnormality. IMPRESSION: No active  cardiopulmonary disease. Electronically Signed  By: Rolm Baptise M.D.   On: 04/06/2021 11:17      IMPRESSION/PLAN: 23. 80 y.o. man with oligometastatic prostate cancer s/p RALP 09/2005 and salvage RT 03/2010 Today, we talked to Dr. Carlis Abbott about the findings and workup thus far. We discussed the natural history of oligometastatic prostate cancer and general treatment, highlighting the role of radiotherapy in the management. We discussed the available radiation techniques, and focused on the details and logistics of delivery. The recommendation is for a 2 week course of daily SBRT to the involved right pelvic nodes and nodal basin. We reviewed the anticipated acute and late sequelae associated with radiation in this setting. The patient was encouraged to ask questions that were answered to his stated satisfaction.  At the end of our conversation, the patient is in agreement to proceed with the recommended two week course of SBRT. He has freely signed written consent to proceed today in the office and a copy of this document will be placed in his medical record. He will proceed with CT simulation later this morning in anticipation of beginning the daily treatments on 04/26/21 when he returns from vacation. We will share our discussion with Dr. Alinda Money and look forward to continuing to participate in the care of this very nice gentleman.  We personally spent 60 minutes in this encounter including chart review, reviewing radiological studies, meeting face-to-face with the patient, entering orders and completing documentation.    Nicholos Johns, PA-C    Tyler Pita, MD  [facility] Direct Dial: [phone]  Fax: [phone] [email or web address]  Skype  [email or web address]   This document serves as a record of services personally performed by Tyler Pita, MD and Freeman Caldron, PA-C. It was created on their behalf by Wilburn Mylar, a trained medical scribe. The creation of this  record is based on the scribe's personal observations and the provider's statements to them. This document has been checked and approved by the attending provider.

## 2021-04-06 NOTE — Progress Notes (Signed)
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  He was found to have an anterior mediastinal mass on CT done in April 2016 as part of metastatic work-up for his prostate cancer.  He had a minimally invasive thymectomy in April 2016.  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  His exercise tolerance was severely limited.  It has improved over time but never returned completely to his baseline.  He recently saw Dr. Alinda [name].  A PET/CT showed some small areas of recurrence in the pelvis.  He is going to have radiation therapy for that.  He continues to get short of breath with exertion but is able to play golf.  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   s/p THYMECTOMY    Colon polyps    hyperplastic and tubular adenomatous   Diverticulosis    Dysrhythmia    hx at fib- ablation 8 yrs ago   Erectile dysfunction following radical prostatectomy    Hypercapnic respiratory failure, chronic ([place]) 05/01/2015   Mediastinal mass    per CT CHEST 11/07/14 @ [facility].[name] KitchenANTERIOR...4.2 X 3.3 cm soft tissue   Neuromuscular disorder (HCC)    Prostate cancer [facility])    prostate cancer   Radiation    for prostate cancer   Radiation proctitis    rectum   Recurrent laryngeal nerve palsy    Respiratory failure (HCC)    s/p phrenic nerve palsy    Thrombocytopenia (HCC)    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     ergocalciferol (VITAMIN D2) 1.25 MG (50000 UT) capsule ergocalciferol (vitamin D2) 1,250 mcg (50,000 unit) capsule  TAKE 1 CAPSULE BY MOUTH TWICE A WEEK     meloxicam (MOBIC) 15 MG tablet Take 7.5 mg by mouth 2 (two) times daily.      Current Facility-Administered Medications  Medication Dose Route Frequency Provider Last Rate Last Admin   0.9 %  sodium chloride infusion  500 mL Intravenous Continuous [name] Shipper, MD        Physical Exam BP 99/61   Pulse 64   Resp 20   Ht '5\' 10"'$  (1.778 m)   Wt 193 lb (87.5 kg)   SpO2 95% Comment: RA  BMI 27.70 kg/m  80 year old man in no acute distress Alert and oriented x3 with no focal deficits Cardiac regular rate and rhythm with normal S1 and S2 Lungs clear with equal breath sounds bilaterally  Diagnostic Tests: NUCLEAR MEDICINE PET SKULL BASE TO THIGH   TECHNIQUE: 8.49 mCi F18 Piflufolastat (Pylarify) was injected intravenously. Full-ring PET imaging was performed from the skull base to thigh after the radiotracer. CT data was obtained and used for attenuation correction and anatomic localization.   COMPARISON:  CT abdomen and pelvis from 2020.   FINDINGS: NECK  No radiotracer activity in neck lymph nodes.   Incidental CT finding: None   CHEST   No radiotracer accumulation within mediastinal or hilar lymph nodes. No suspicious pulmonary nodules on the CT scan.   Incidental CT finding:   Postoperative changes in the chest. Three-vessel coronary artery disease. Aortic atherosclerosis. Normal heart size. Normal caliber of the central pulmonary vessels.   Tiny RIGHT upper lobe pulmonary nodule stable since 2017 and associated with a small amount of calcification (image 21/8) no effusion. No consolidative changes. Airways are patent.   ABDOMEN/PELVIS   Prostate: No abnormal radiotracer accumulation in the prostate bed.   Lymph nodes: Adenopathy in the pelvis.   (Image 192/4) very  small RIGHT pelvic sidewall lymph node measuring 5 mm with a maximum SUV of 14.5.   (Image 194/4) small RIGHT pelvic sidewall lymph node measuring 6 mm with a maximum SUV similar to the above small lymph node.   (Image 200/4) soft tissue and or small lymph node measuring 4 mm along the RIGHT pelvic sidewall showing a maximum SUV of 14.7. No signs of LEFT pelvic sidewall lymphadenopathy   Liver: No evidence of liver metastasis   Incidental CT finding: Lobular hepatic contours. Similar to prior examination. Cholelithiasis. No acute findings relative to pancreas, spleen, adrenal glands or kidneys. Post prostatectomy. No acute gastrointestinal findings, normal appendix. Aortic atherosclerosis without aneurysmal dilation.   SKELETON   No focal  activity to suggest skeletal metastasis.   IMPRESSION: Signs of nodal recurrence in the pelvis following prostatectomy.   Aortic atherosclerosis.   Aortic Atherosclerosis (ICD10-I70.0).     Electronically Signed   By: [name] [name] M.D.   On: 03/02/2021 15:20  CHEST - 2 VIEW   COMPARISON:  03/17/2020   FINDINGS: Postoperative changes with clips in the mediastinum. Heart is normal size. Mediastinal contours within normal limits. Lungs are clear. No effusions or acute bony abnormality.   IMPRESSION: No active cardiopulmonary disease.     Electronically Signed   By: [name] [name] M.D.   On: 04/06/2021 11:17 I personally reviewed the chest x-ray images and the CT images from the PET/CT in June.  There is no abnormality of the anterior mediastinum.  Of interest his left diaphragm was lower than his right on the chest x-ray.  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  He had resection of the thymic cyst in 2016.  There is no evidence of recurrence on his scans.  Left phrenic nerve dysfunction-he had an elevated left hemidiaphragm.  He did have a sniff  test at 1 point which showed function of the diaphragm.  On his chest x-ray today the left hemidiaphragm is actually lower than the right.  He likely does still have some dysfunction there and still has some mild dyspnea with exertion.  Plan: Follow-up with Dr. Alinda [name] Return in 1 year with PA lateral chest x-ray  Melrose Nakayama, MD [facility] [phone]

## 2021-04-07 NOTE — Progress Notes (Signed)
  Radiation Oncology         [phone]) [phone] ________________________________  Name: [name] [name] MRN: [id number]  Date: 04/06/2021  DOB: 1941/08/08  STEREOTACTIC BODY RADIOTHERAPY SIMULATION AND TREATMENT PLANNING NOTE    ICD-10-CM   1. Prostate cancer metastatic to intrapelvic lymph node ([place])  C61    C77.5       DIAGNOSIS:  80 y.o. man with oligometastatic right pelvic lymphadenopathy from prostate cancer s/p RALP 09/2005 and salvage RT 03/2010  NARRATIVE:  The patient was brought to the [facility].  Identity was confirmed.  All relevant records and images related to the planned course of therapy were reviewed.  The patient freely provided informed written consent to proceed with treatment after reviewing the details related to the planned course of therapy. The consent form was witnessed and verified by the simulation staff.  Then, the patient was set-up in a stable reproducible  supine position for radiation therapy.  A BodyFix immobilization pillow was fabricated for reproducible positioning.  Surface markings were placed.  The CT images were loaded into the planning software.  The gross target volumes (GTV) and planning target volumes (PTV) were delinieated, and avoidance structures were contoured.  Treatment planning then occurred.  The radiation prescription was entered and confirmed.  A total of two complex treatment devices were fabricated in the form of the BodyFix immobilization pillow and a neck accuform cushion.  I have requested : 3D Simulation  I have requested a DVH of the following structures: targets and all normal structures near the target including bladder, femoral heads, bowel and rectum as noted on the radiation plan to maintain doses in adherence with established limits  SPECIAL TREATMENT PROCEDURE:  The planned course of therapy using radiation constitutes a special treatment procedure. Special care is required in the management of this patient for the  following reasons. High dose per fraction requiring special monitoring for increased toxicities of treatment including daily imaging..  The special nature of the planned course of radiotherapy will require increased physician supervision and oversight to ensure patient's safety with optimal treatment outcomes.    This requires extended time and effort.    PLAN:  The patient will receive 50 Gy in 10 fractions.  ________________________________  Sheral Apley Tammi Klippel, M.D.

## 2021-04-09 DIAGNOSIS — C61 Malignant neoplasm of prostate: Secondary | ICD-10-CM | POA: Diagnosis not present

## 2021-04-15 ENCOUNTER — Ambulatory Visit: Payer: PPO

## 2021-04-16 ENCOUNTER — Ambulatory Visit: Payer: PPO

## 2021-04-19 ENCOUNTER — Ambulatory Visit: Payer: PPO

## 2021-04-20 ENCOUNTER — Ambulatory Visit: Payer: PPO

## 2021-04-21 ENCOUNTER — Ambulatory Visit: Payer: PPO

## 2021-04-22 ENCOUNTER — Ambulatory Visit: Payer: PPO

## 2021-04-23 ENCOUNTER — Ambulatory Visit: Payer: PPO

## 2021-04-26 ENCOUNTER — Other Ambulatory Visit: Payer: Self-pay

## 2021-04-26 ENCOUNTER — Ambulatory Visit
Admission: RE | Admit: 2021-04-26 | Discharge: 2021-04-26 | Disposition: A | Discharge status: Home / Self Care | Payer: PPO | Source: Ambulatory Visit | Attending: Radiation Oncology | Admitting: Radiation Oncology

## 2021-04-26 DIAGNOSIS — Z510 Encounter for antineoplastic radiation therapy: Secondary | ICD-10-CM | POA: Insufficient documentation

## 2021-04-26 DIAGNOSIS — C61 Malignant neoplasm of prostate: Secondary | ICD-10-CM | POA: Diagnosis not present

## 2021-04-26 DIAGNOSIS — C775 Secondary and unspecified malignant neoplasm of intrapelvic lymph nodes: Secondary | ICD-10-CM | POA: Diagnosis not present

## 2021-04-27 ENCOUNTER — Ambulatory Visit
Admission: RE | Admit: 2021-04-27 | Discharge: 2021-04-27 | Disposition: A | Discharge status: Home / Self Care | Payer: PPO | Source: Ambulatory Visit | Attending: Radiation Oncology | Admitting: Radiation Oncology

## 2021-04-27 DIAGNOSIS — Z510 Encounter for antineoplastic radiation therapy: Secondary | ICD-10-CM | POA: Diagnosis not present

## 2021-04-27 DIAGNOSIS — C61 Malignant neoplasm of prostate: Secondary | ICD-10-CM | POA: Diagnosis not present

## 2021-04-27 DIAGNOSIS — C775 Secondary and unspecified malignant neoplasm of intrapelvic lymph nodes: Secondary | ICD-10-CM | POA: Diagnosis not present

## 2021-04-28 ENCOUNTER — Other Ambulatory Visit: Payer: Self-pay

## 2021-04-28 ENCOUNTER — Ambulatory Visit
Admission: RE | Admit: 2021-04-28 | Discharge: 2021-04-28 | Disposition: A | Discharge status: Home / Self Care | Payer: PPO | Source: Ambulatory Visit | Attending: Radiation Oncology | Admitting: Radiation Oncology

## 2021-04-28 DIAGNOSIS — Z510 Encounter for antineoplastic radiation therapy: Secondary | ICD-10-CM | POA: Diagnosis not present

## 2021-04-28 DIAGNOSIS — C61 Malignant neoplasm of prostate: Secondary | ICD-10-CM | POA: Diagnosis not present

## 2021-04-28 DIAGNOSIS — C775 Secondary and unspecified malignant neoplasm of intrapelvic lymph nodes: Secondary | ICD-10-CM | POA: Diagnosis not present

## 2021-04-29 ENCOUNTER — Ambulatory Visit
Admission: RE | Admit: 2021-04-29 | Discharge: 2021-04-29 | Disposition: A | Discharge status: Home / Self Care | Payer: PPO | Source: Ambulatory Visit | Attending: Radiation Oncology | Admitting: Radiation Oncology

## 2021-04-29 DIAGNOSIS — Z510 Encounter for antineoplastic radiation therapy: Secondary | ICD-10-CM | POA: Diagnosis not present

## 2021-04-29 DIAGNOSIS — C775 Secondary and unspecified malignant neoplasm of intrapelvic lymph nodes: Secondary | ICD-10-CM | POA: Diagnosis not present

## 2021-04-29 DIAGNOSIS — C61 Malignant neoplasm of prostate: Secondary | ICD-10-CM | POA: Diagnosis not present

## 2021-04-30 ENCOUNTER — Other Ambulatory Visit: Payer: Self-pay

## 2021-04-30 ENCOUNTER — Ambulatory Visit
Admission: RE | Admit: 2021-04-30 | Discharge: 2021-04-30 | Disposition: A | Discharge status: Home / Self Care | Payer: PPO | Source: Ambulatory Visit | Attending: Radiation Oncology | Admitting: Radiation Oncology

## 2021-04-30 DIAGNOSIS — Z510 Encounter for antineoplastic radiation therapy: Secondary | ICD-10-CM | POA: Diagnosis not present

## 2021-04-30 DIAGNOSIS — C61 Malignant neoplasm of prostate: Secondary | ICD-10-CM | POA: Diagnosis not present

## 2021-04-30 DIAGNOSIS — C775 Secondary and unspecified malignant neoplasm of intrapelvic lymph nodes: Secondary | ICD-10-CM | POA: Diagnosis not present

## 2021-05-03 ENCOUNTER — Other Ambulatory Visit: Payer: Self-pay

## 2021-05-03 ENCOUNTER — Ambulatory Visit
Admission: RE | Admit: 2021-05-03 | Discharge: 2021-05-03 | Disposition: A | Discharge status: Home / Self Care | Payer: PPO | Source: Ambulatory Visit | Attending: Radiation Oncology | Admitting: Radiation Oncology

## 2021-05-03 DIAGNOSIS — Z510 Encounter for antineoplastic radiation therapy: Secondary | ICD-10-CM | POA: Diagnosis not present

## 2021-05-03 DIAGNOSIS — N390 Urinary tract infection, site not specified: Secondary | ICD-10-CM | POA: Diagnosis not present

## 2021-05-03 DIAGNOSIS — C775 Secondary and unspecified malignant neoplasm of intrapelvic lymph nodes: Secondary | ICD-10-CM | POA: Diagnosis not present

## 2021-05-03 DIAGNOSIS — R7301 Impaired fasting glucose: Secondary | ICD-10-CM | POA: Diagnosis not present

## 2021-05-03 DIAGNOSIS — C61 Malignant neoplasm of prostate: Secondary | ICD-10-CM | POA: Diagnosis not present

## 2021-05-04 ENCOUNTER — Ambulatory Visit
Admission: RE | Admit: 2021-05-04 | Discharge: 2021-05-04 | Disposition: A | Discharge status: Home / Self Care | Payer: PPO | Source: Ambulatory Visit | Attending: Radiation Oncology | Admitting: Radiation Oncology

## 2021-05-04 DIAGNOSIS — C61 Malignant neoplasm of prostate: Secondary | ICD-10-CM | POA: Diagnosis not present

## 2021-05-04 DIAGNOSIS — Z510 Encounter for antineoplastic radiation therapy: Secondary | ICD-10-CM | POA: Diagnosis not present

## 2021-05-04 DIAGNOSIS — C775 Secondary and unspecified malignant neoplasm of intrapelvic lymph nodes: Secondary | ICD-10-CM | POA: Diagnosis not present

## 2021-05-05 ENCOUNTER — Ambulatory Visit
Admission: RE | Admit: 2021-05-05 | Discharge: 2021-05-05 | Disposition: A | Discharge status: Home / Self Care | Payer: PPO | Source: Ambulatory Visit | Attending: Radiation Oncology | Admitting: Radiation Oncology

## 2021-05-05 ENCOUNTER — Other Ambulatory Visit: Payer: Self-pay

## 2021-05-05 DIAGNOSIS — Z510 Encounter for antineoplastic radiation therapy: Secondary | ICD-10-CM | POA: Diagnosis not present

## 2021-05-05 DIAGNOSIS — C61 Malignant neoplasm of prostate: Secondary | ICD-10-CM | POA: Diagnosis not present

## 2021-05-05 DIAGNOSIS — C775 Secondary and unspecified malignant neoplasm of intrapelvic lymph nodes: Secondary | ICD-10-CM | POA: Diagnosis not present

## 2021-05-06 ENCOUNTER — Ambulatory Visit
Admission: RE | Admit: 2021-05-06 | Discharge: 2021-05-06 | Disposition: A | Discharge status: Home / Self Care | Payer: PPO | Source: Ambulatory Visit | Attending: Radiation Oncology | Admitting: Radiation Oncology

## 2021-05-06 DIAGNOSIS — C61 Malignant neoplasm of prostate: Secondary | ICD-10-CM | POA: Diagnosis not present

## 2021-05-06 DIAGNOSIS — C775 Secondary and unspecified malignant neoplasm of intrapelvic lymph nodes: Secondary | ICD-10-CM | POA: Diagnosis not present

## 2021-05-06 DIAGNOSIS — Z510 Encounter for antineoplastic radiation therapy: Secondary | ICD-10-CM | POA: Diagnosis not present

## 2021-05-07 ENCOUNTER — Ambulatory Visit
Admission: RE | Admit: 2021-05-07 | Discharge: 2021-05-07 | Disposition: A | Discharge status: Home / Self Care | Payer: PPO | Source: Ambulatory Visit | Attending: Radiation Oncology | Admitting: Radiation Oncology

## 2021-05-07 ENCOUNTER — Encounter: Payer: Self-pay | Admitting: Urology

## 2021-05-07 ENCOUNTER — Other Ambulatory Visit: Payer: Self-pay

## 2021-05-07 DIAGNOSIS — C775 Secondary and unspecified malignant neoplasm of intrapelvic lymph nodes: Secondary | ICD-10-CM

## 2021-05-07 DIAGNOSIS — Z510 Encounter for antineoplastic radiation therapy: Secondary | ICD-10-CM | POA: Diagnosis not present

## 2021-05-07 DIAGNOSIS — C61 Malignant neoplasm of prostate: Secondary | ICD-10-CM

## 2021-05-10 ENCOUNTER — Telehealth: Payer: Self-pay | Admitting: *Deleted

## 2021-05-10 NOTE — Telephone Encounter (Signed)
Spoke with the patient regarding a voicemail he left.  He has some concerns about continued frequency of stools following completion of radiation therapy 05/07/2021.  He was advised to take some over the counter imodium per instructions on the box.  He was encouraged to maintain adequate diet and fluid intake.  He was concerned about constipation while taking imodium.  We discussed when to back off of the imodium.  He verbalized understanding of all instructions given.  We will follow-up later in the week.  Gloriajean Dell. Leonie Green, BSN

## 2021-05-11 DIAGNOSIS — C61 Malignant neoplasm of prostate: Secondary | ICD-10-CM | POA: Diagnosis not present

## 2021-05-11 DIAGNOSIS — C775 Secondary and unspecified malignant neoplasm of intrapelvic lymph nodes: Secondary | ICD-10-CM | POA: Diagnosis not present

## 2021-05-14 DIAGNOSIS — N390 Urinary tract infection, site not specified: Secondary | ICD-10-CM | POA: Diagnosis not present

## 2021-05-14 DIAGNOSIS — C61 Malignant neoplasm of prostate: Secondary | ICD-10-CM | POA: Diagnosis not present

## 2021-05-14 DIAGNOSIS — R7301 Impaired fasting glucose: Secondary | ICD-10-CM | POA: Diagnosis not present

## 2021-05-21 DIAGNOSIS — N320 Bladder-neck obstruction: Secondary | ICD-10-CM | POA: Diagnosis not present

## 2021-05-21 DIAGNOSIS — R319 Hematuria, unspecified: Secondary | ICD-10-CM | POA: Diagnosis not present

## 2021-05-21 DIAGNOSIS — N390 Urinary tract infection, site not specified: Secondary | ICD-10-CM | POA: Diagnosis not present

## 2021-05-21 DIAGNOSIS — C61 Malignant neoplasm of prostate: Secondary | ICD-10-CM | POA: Diagnosis not present

## 2021-06-02 DIAGNOSIS — R7301 Impaired fasting glucose: Secondary | ICD-10-CM | POA: Diagnosis not present

## 2021-06-02 DIAGNOSIS — K219 Gastro-esophageal reflux disease without esophagitis: Secondary | ICD-10-CM | POA: Diagnosis not present

## 2021-06-02 DIAGNOSIS — R059 Cough, unspecified: Secondary | ICD-10-CM | POA: Diagnosis not present

## 2021-06-02 DIAGNOSIS — R1310 Dysphagia, unspecified: Secondary | ICD-10-CM | POA: Diagnosis not present

## 2021-06-21 DIAGNOSIS — C61 Malignant neoplasm of prostate: Secondary | ICD-10-CM | POA: Diagnosis not present

## 2021-06-21 DIAGNOSIS — N390 Urinary tract infection, site not specified: Secondary | ICD-10-CM | POA: Diagnosis not present

## 2021-06-23 ENCOUNTER — Ambulatory Visit
Admission: RE | Admit: 2021-06-23 | Discharge: 2021-06-23 | Disposition: A | Discharge status: Home / Self Care | Payer: PPO | Source: Ambulatory Visit | Attending: Radiation Oncology | Admitting: Radiation Oncology

## 2021-06-23 ENCOUNTER — Encounter: Payer: Self-pay | Admitting: Urology

## 2021-06-23 DIAGNOSIS — N390 Urinary tract infection, site not specified: Secondary | ICD-10-CM | POA: Diagnosis not present

## 2021-06-23 DIAGNOSIS — R319 Hematuria, unspecified: Secondary | ICD-10-CM | POA: Diagnosis not present

## 2021-06-23 DIAGNOSIS — C61 Malignant neoplasm of prostate: Secondary | ICD-10-CM | POA: Diagnosis not present

## 2021-06-23 DIAGNOSIS — Z510 Encounter for antineoplastic radiation therapy: Secondary | ICD-10-CM | POA: Insufficient documentation

## 2021-06-23 NOTE — Progress Notes (Signed)
Patient reports current UTI (on Doxycycline), and nocturia x4. No other symptoms reported at this time. No current urinary management medications. Urology follow-up at [facility] w/ Dr. Alinda Money scheduled for October 1st. 2022  I-PSS Score of 10 (moderate).  Meaningful use complete.  Patient notified of 1:30pm-06/23/21 telephone appointment and verbalized understanding.   Patient requested to be called at 2:30pm due to being in a meeting around 1:30pm.

## 2021-06-23 NOTE — Progress Notes (Signed)
  Radiation Oncology         [id number]) [phone] ________________________________  Name: [name] [name] MRN: [id number]  Date: 05/07/2021  DOB: 1941/04/23  End of Treatment Note  Diagnosis:   80 y.o. man with oligometastatic right pelvic lymphadenopathy from prostate cancer s/p RALP 09/2005 and salvage RT 03/2010     Indication for treatment:  Curative, Definitive SBRT       Radiation treatment dates:   04/26/21 - 05/07/21  Site/dose:   The target was treated to 50 Gy in 10 fractions of 5 Gy  Beams/energy:   The patient was treated using stereotactic body radiotherapy according to a 3D conformal radiotherapy plan.  Volumetric arc fields were employed to deliver 6 MV X-rays.  Image guidance was performed with per fraction cone beam CT prior to treatment under personal MD supervision.  Immobilization was achieved using BodyFix Pillow.  Narrative: The patient tolerated radiation treatment relatively well with only mild GI upset/loose stools.  Plan: The patient has completed radiation treatment. The patient will return to radiation oncology clinic for routine followup in one month. I advised them to call or return sooner if they have any questions or concerns related to their recovery or treatment. ________________________________  Sheral Apley. Tammi Klippel, M.D.

## 2021-06-24 ENCOUNTER — Telehealth: Payer: Self-pay | Admitting: *Deleted

## 2021-06-24 ENCOUNTER — Encounter: Payer: Self-pay | Admitting: Urology

## 2021-06-24 NOTE — Progress Notes (Signed)
Patient reports current UTI (on Doxycycline), and nocturia x4. No other symptoms reported at this time. No current urinary management medications. Urology follow-up at [facility] w/ Dr. Alinda Money scheduled for October 1st. 2022   I-PSS Score of 10 (moderate).   Meaningful use complete.   Patient notified of 1:30pm-06/25/21 telephone appointment and verbalized understanding.

## 2021-06-24 NOTE — Telephone Encounter (Signed)
CALLED PATIENT TO ASK ABOUT RESCHEDULING MISSED TELEPHONE FU, PATIENT AGREED FOR THE PROVIDER TO CALL HIM ON 06-25-21 @ 1:30 PM

## 2021-06-25 ENCOUNTER — Ambulatory Visit
Admission: RE | Admit: 2021-06-25 | Discharge: 2021-06-25 | Disposition: A | Discharge status: Home / Self Care | Payer: PPO | Source: Ambulatory Visit | Attending: Urology | Admitting: Urology

## 2021-06-25 DIAGNOSIS — C61 Malignant neoplasm of prostate: Secondary | ICD-10-CM

## 2021-06-25 NOTE — Progress Notes (Signed)
Radiation Oncology         [phone]) [phone] ________________________________  Name: [name] [name] MRN: [id number]  Date: 06/25/2021  DOB: 12/05/1940  Post Treatment Note  CC: Hayden Rasmussen, MD  Raynelle Bring, MD  Diagnosis:   80 y.o. man with oligometastatic right pelvic lymphadenopathy from prostate cancer s/p RALP 09/2005 and salvage RT 03/2010  Interval Since Last Radiation:  7 weeks  04/26/21 - 05/07/21:  The involved right pelvic lymph nodes and nodal basin were treated to 50 Gy in 10 fractions of 5 Gy  Narrative:  I spoke with the patient to conduct his routine scheduled 1 month follow up visit via telephone to spare the patient unnecessary potential exposure in the healthcare setting during the current COVID-19 pandemic.  The patient was notified in advance and gave permission to proceed with this visit format.  He tolerated radiation treatment relatively well with only mild GI upset/loose stools.                              On review of systems, the patient states that he is doing well in general.  He is currently on doxycycline for treatment of a UTI and reports persistent nocturia x4, dysuria, gross hematuria, and frequency. His UTI has been managed by his PCP and this is the 4th UTI he has dealt with since completing radiation. He is unsure whether urine cultures have been performed up to this point but feels certain that the most recent specimen was sent for C&S and expects to hear back from his PCP this afternoon regarding any necessary changes in treatment.  He denies abdominal pain but reports loose stools which he associates with the multiple courses of antibiotics. He reports a healthy appetite and is maintaining his weight.  Overall, he is pleased with his progress.  ALLERGIES:  has No Known Allergies.  Meds: Current Outpatient Medications  Medication Sig Dispense Refill   aspirin 81 MG EC tablet aspirin 81 mg tablet,delayed release  Take 1 tablet every day by oral route.      doxycycline (ORACEA) 40 MG capsule Take 40 mg by mouth every morning.     ergocalciferol (VITAMIN D2) 1.25 MG (50000 UT) capsule ergocalciferol (vitamin D2) 1,250 mcg (50,000 unit) capsule  TAKE 1 CAPSULE BY MOUTH TWICE A WEEK     meloxicam (MOBIC) 15 MG tablet Take 7.5 mg by mouth 2 (two) times daily.      Current Facility-Administered Medications  Medication Dose Route Frequency Provider Last Rate Last Admin   0.9 %  sodium chloride infusion  500 mL Intravenous Continuous Irene Shipper, MD        Physical Findings:  vitals were not taken for this visit.  Pain Assessment Pain Score: 0-No pain/10 Unable to assess due to telephone follow-up visit format.  Lab Findings: Lab Results  Component Value Date   WBC 11.3 (H) 11/28/2014   HGB 12.5 (L) 11/28/2014   HCT 38.0 (L) 11/28/2014   MCV 91.3 11/28/2014   PLT 163 11/28/2014     Radiographic Findings: No results found.  Impression/Plan: 47. 80 y.o. man with oligometastatic right pelvic lymphadenopathy from prostate cancer s/p RALP 09/2005 and salvage RT 03/2010. He will continue to follow up with urology for ongoing PSA determinations and has an appointment scheduled with Dr. Alinda Money on 07/13/21. He understands what to expect with regards to PSA monitoring going forward. I will look forward to following his response  to treatment via correspondence with urology, and would be happy to continue to participate in his care if clinically indicated. I encouraged him to call or return to the office if he has any questions regarding his previous radiation or possible radiation side effects. He was comfortable with this plan and will follow up as needed.      Nicholos Johns, PA-C

## 2021-07-02 DIAGNOSIS — N3000 Acute cystitis without hematuria: Secondary | ICD-10-CM | POA: Diagnosis not present

## 2021-07-02 DIAGNOSIS — C61 Malignant neoplasm of prostate: Secondary | ICD-10-CM | POA: Diagnosis not present

## 2021-08-17 DIAGNOSIS — H52223 Regular astigmatism, bilateral: Secondary | ICD-10-CM | POA: Diagnosis not present

## 2021-08-17 DIAGNOSIS — H2513 Age-related nuclear cataract, bilateral: Secondary | ICD-10-CM | POA: Diagnosis not present

## 2021-08-17 DIAGNOSIS — H524 Presbyopia: Secondary | ICD-10-CM | POA: Diagnosis not present

## 2021-08-17 DIAGNOSIS — H5213 Myopia, bilateral: Secondary | ICD-10-CM | POA: Diagnosis not present

## 2021-08-17 DIAGNOSIS — H25813 Combined forms of age-related cataract, bilateral: Secondary | ICD-10-CM | POA: Diagnosis not present

## 2021-11-23 ENCOUNTER — Telehealth: Payer: Self-pay | Admitting: Internal Medicine

## 2021-11-23 NOTE — Telephone Encounter (Signed)
Hi Dr. Henrene Pastor, ? 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 His son-in-law has some type of rectal mass and he is highly concerned and is requesting for you to give him a call to discuss further and advise on scheduling.  ?

## 2021-11-23 NOTE — Telephone Encounter (Signed)
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  He called regarding his son-in-law Roxan Diesel date of birth 07-23-1987.  I cannot be certain, but it sounds like he was treated as an outpatient for a perirectal abscess (ER in [facility]).  He was wanting help and reassurance regarding his son-in-law's condition.  We discussed a number of options.  I offered, as a courtesy, to review patient's medical records, if this is what they want and they were willing to collect the records and bring them to my office.  I gave him my nurses name.  I will copy her on this note. ?

## 2021-11-24 NOTE — Telephone Encounter (Signed)
Noted  

## 2021-12-28 ENCOUNTER — Encounter: Payer: Self-pay | Admitting: Podiatrist

## 2021-12-28 ENCOUNTER — Ambulatory Visit (INDEPENDENT_AMBULATORY_CARE_PROVIDER_SITE_OTHER): Payer: PPO | Admitting: Podiatrist

## 2021-12-28 DIAGNOSIS — L600 Ingrowing nail: Secondary | ICD-10-CM | POA: Diagnosis not present

## 2021-12-28 MED ORDER — MUPIROCIN 2 % EX OINT: 1.0000 "application " | TOPICAL_OINTMENT | Freq: Two times a day (BID) | CUTANEOUS | 2 refills | Status: DC

## 2021-12-28 NOTE — Patient Instructions (Signed)
Soak Instructions    THE DAY AFTER THE PROCEDURE  Place 1/4 cup of epsom salts in a quart of warm tap water.  Submerge your foot or feet with outer bandage intact for the initial soak; this will allow the bandage to become moist and wet for easy lift off.  Once you remove your bandage, continue to soak in the solution for 20 minutes.  This soak should be done twice a day.  Next, remove your foot or feet from solution, blot dry the affected area and cover.  Apply polysporin or neosporin.   You may use a band aid large enough to cover the area or use gauze and tape.     IF YOUR SKIN BECOMES IRRITATED WHILE USING THESE INSTRUCTIONS, IT IS OKAY TO SWITCH TO  antibacterial soap pump soap (Dial)  and water to keep the toe clean instead of soaking in epsom salts.    Long Term Care Instructions-Post Nail Surgery  You have had your ingrown toenail and root treated with a chemical.  This chemical causes a burn that will drain and ooze like a blister.  1-2 weeks after the procedure you may leave the area open to air at night to help dry it up.  During the day,  It is important to keep this area clean and covered until the toe dries out and forms a scab. Once the scab forms you no longer need to soak or apply a dressing.  If at any time you experience an increase in pain, redness, swelling, or drainage, you should contact the office as soon as possible.  

## 2021-12-28 NOTE — Progress Notes (Signed)
?Chief Complaint  ?Patient presents with  ? Ingrown Toenail  ?   (np) left foot great toe-ingrown-  ?  ? ?HPI: Patient is 81 y.o. male who presents today for pain on the left hallux, medial border. He has had this border removed twice in the last year and it keeps growing in at the tip of the toe. He relates redness and pain as the corner of the nail grows into the skin.  He has been soaking in epsom salts to help relieve the pain.  ? ?Patient Active Problem List  ? Diagnosis Date Noted  ? Prostate cancer metastatic to intrapelvic lymph node ([place]) 04/05/2021  ? Abdominal aortic atherosclerosis ([place]) 06/24/2020  ? Lumbar post-laminectomy syndrome 06/24/2020  ? Pain in left knee 11/28/2019  ? Lumbar facet joint pain 11/05/2019  ? Shoulder pain 01/21/2016  ? 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 Hypercapnic respiratory failure, chronic ([place]) 05/01/2015  ? Chronic respiratory failure with hypercapnia ([place]) 05/01/2015  ? Thymoma 04/16/2015  ? 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  ? Thymic cyst ([place]) 12/11/2014  ? Erectile dysfunction following radical prostatectomy   ? Myofascial pain 08/12/2014  ? Lumbar spondylosis with myelopathy 08/11/2014  ? Spinal stenosis 07/29/2014  ? Degeneration of lumbar intervertebral disc 07/16/2014  ? Lumbar radiculopathy 07/16/2014  ? Prostate cancer ([place]) 08/13/2012  ? Atrial fibrillation ([place]) 08/13/2012  ? Right elbow pain 07/05/2012  ? ? ?Current Outpatient Medications on File Prior to Visit  ?Medication Sig Dispense Refill  ? aspirin 81 MG EC tablet aspirin 81 mg tablet,delayed release ? Take 1 tablet every day by oral route.    ? doxycycline (ORACEA) 40 MG capsule Take 40 mg by mouth every morning.    ? ergocalciferol (VITAMIN D2) 1.25 MG (50000 UT) capsule ergocalciferol (vitamin D2) 1,250 mcg (50,000  unit) capsule ? TAKE 1 CAPSULE BY MOUTH TWICE A WEEK    ? FLUAD QUADRIVALENT 0.5 ML injection     ? gatifloxacin (ZYMAXID) 0.5 % SOLN Place 1 drop into the right eye 4 (four) times daily.    ? meloxicam (MOBIC) 15 MG tablet Take 7.5 mg by mouth 2 (two) times daily.     ? omeprazole (PRILOSEC) 20 MG capsule Take 20 mg by mouth daily.    ? prednisoLONE acetate (PRED FORTE) 1 % ophthalmic suspension Place 1 drop into the right eye 4 (four) times daily.    ? PROLENSA 0.07 % SOLN Place 1 drop into the right eye at bedtime.    ? ?Current Facility-Administered Medications on File Prior to Visit  ?Medication Dose Route Frequency Provider Last Rate Last Admin  ? 0.9 %  sodium chloride infusion  500 mL Intravenous Continuous Irene Shipper, MD      ? ? ?No Known Allergies ? ?Review of Systems ?No fevers, chills, nausea, muscle aches, no difficulty breathing, no calf pain, no chest pain or shortness of breath. ? ? ?Physical Exam ? ?GENERAL APPEARANCE: Alert, conversant. Appropriately groomed. No acute distress.  ? ?VASCULAR: Pedal pulses palpable DP and PT bilateral.  Capillary refill time is immediate to all digits,  Proximal to distal cooling it warm to warm.  Digital perfusion adequate.  ? ?NEUROLOGIC: sensation is intact to 5.07 monofilament at 5/5 sites bilateral.  Light touch is intact bilateral, vibratory sensation intact bilateral ? ?MUSCULOSKELETAL: acceptable muscle strength, tone  and stability bilateral.  No gross boney pedal deformities noted.  No pain, crepitus or limitation noted with foot and ankle range of motion bilateral.  ? ?DERMATOLOGIC: skin is warm, supple, and dry.  Color, texture, and turgor of skin within normal limits.  Left hallux nail medial nail border is ingrow and inflamed.  There is a large spicule of nail growing into the distal aspect of the toenail.  No pus or pirulence is encountered. No streaking or swelling is seen.  ? ? ? ?Assessment  ? ?  ICD-10-CM   ?1. Ingrown left big toenail  L60.0    ?  ? ? ? ?Plan ? ?Treatment options and alternatives were discussed. Recommended a permanent removal of the medial nail border of the left hallux nail.   Patient agreed. Skin was prepped with alcohol and a local injection of lidocaine and Marcaine plain was infiltrated to anesthetize the toe. The toe was then prepped with Betadine exsanguinated. The offending nail border is removed and phenol applied.  It was cleansed well with alcohol. Antibiotic ointment and a dressing was then applied and the patient was given instructions for aftercare. A rx for mupirocin ointment was also called in for his use after soaks.  If he sees any redness, swelling or any concerns arise, he will let us know- otherwise will return as needed.  ? ?

## 2022-03-09 ENCOUNTER — Other Ambulatory Visit (HOSPITAL_COMMUNITY): Payer: Self-pay | Admitting: Urology

## 2022-03-09 DIAGNOSIS — C61 Malignant neoplasm of prostate: Secondary | ICD-10-CM

## 2022-03-16 ENCOUNTER — Encounter (HOSPITAL_COMMUNITY)
Admission: RE | Admit: 2022-03-16 | Discharge: 2022-03-16 | Disposition: A | Discharge status: Home / Self Care | Payer: PPO | Source: Ambulatory Visit | Attending: Urology | Admitting: Urology

## 2022-03-16 DIAGNOSIS — C61 Malignant neoplasm of prostate: Secondary | ICD-10-CM

## 2022-03-16 MED ORDER — PIFLIFOLASTAT F 18 (PYLARIFY) INJECTION
9.0000 | Freq: Once | INTRAVENOUS | Status: AC
  Administered 2022-03-16: 8.73 via INTRAVENOUS

## 2022-04-11 ENCOUNTER — Other Ambulatory Visit: Payer: Self-pay | Admitting: Thoracic Surgery (Cardiothoracic Vascular Surgery)

## 2022-04-11 DIAGNOSIS — D4989 Neoplasm of unspecified behavior of other specified sites: Secondary | ICD-10-CM

## 2022-04-12 ENCOUNTER — Encounter: Payer: Self-pay | Admitting: Thoracic Surgery (Cardiothoracic Vascular Surgery)

## 2022-04-12 ENCOUNTER — Ambulatory Visit: Payer: PPO | Admitting: Thoracic Surgery (Cardiothoracic Vascular Surgery)

## 2022-04-12 ENCOUNTER — Ambulatory Visit
Admission: RE | Admit: 2022-04-12 | Discharge: 2022-04-12 | Disposition: A | Discharge status: Home / Self Care | Payer: PPO | Source: Ambulatory Visit | Attending: Thoracic Surgery (Cardiothoracic Vascular Surgery) | Admitting: Thoracic Surgery (Cardiothoracic Vascular Surgery)

## 2022-04-12 VITALS — BP 127/78 | HR 65 | Resp 20 | Ht 70.0 in | Wt 185.0 lb

## 2022-04-12 DIAGNOSIS — Z9089 Acquired absence of other organs: Secondary | ICD-10-CM

## 2022-04-12 DIAGNOSIS — D4989 Neoplasm of unspecified behavior of other specified sites: Secondary | ICD-10-CM

## 2022-04-12 DIAGNOSIS — J986 Disorders of diaphragm: Secondary | ICD-10-CM | POA: Diagnosis not present

## 2022-04-12 NOTE — Progress Notes (Signed)
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  He was found to have an anterior mediastinal mass on the CT in 2016.  He had a minimally invasive thymectomy via a left VATS approach in April 2016.  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  His exercise tolerance was initially severely limited.  It did improve over time to the point where he can walk 9 holes of golf.  Does get short of breath walking up stairs or inclines.  He recently saw Dr. Alinda Money.  He had a PET/CT which showed some areas of recurrence in the retroperitoneum and in the chest.  He is awaiting evaluation at [place] to see what his options for treatment are.  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   s/p THYMECTOMY    Colon polyps    hyperplastic and tubular adenomatous   Diverticulosis    Dysrhythmia    hx at fib- ablation 8 yrs ago   Erectile dysfunction following radical prostatectomy    Hypercapnic respiratory failure, chronic ([place]) 05/01/2015   Mediastinal mass    per CT CHEST 11/07/14 @ [facility].[name] KitchenANTERIOR...4.2 X 3.3 cm soft tissue   Neuromuscular disorder (HCC)    Prostate cancer [facility])    prostate cancer   Radiation    for prostate cancer   Radiation proctitis    rectum   Recurrent laryngeal nerve palsy    Respiratory failure (HCC)    s/p phrenic nerve palsy    Thrombocytopenia (HCC)    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     doxycycline (ORACEA) 40 MG capsule Take 40 mg by mouth every morning.     FLUAD QUADRIVALENT 0.5 ML injection      gatifloxacin (ZYMAXID) 0.5 % SOLN Place 1 drop into the right eye 4 (four) times daily.     meloxicam (MOBIC) 15 MG tablet Take 7.5 mg by mouth 2 (two) times daily.      mupirocin ointment (BACTROBAN) 2 % Apply 1 application. topically 2 (two) times daily. Apply to toe twice daily and cover with a bandaid 30 g 2   omeprazole (PRILOSEC) 20 MG capsule Take 20 mg by mouth daily.     prednisoLONE acetate (PRED FORTE) 1 % ophthalmic suspension Place 1 drop into the right eye 4 (four) times daily.     PROLENSA 0.07 % SOLN Place 1 drop into the right eye at bedtime.     Current Facility-Administered Medications  Medication Dose Route Frequency Provider Last Rate Last Admin   0.9 %  sodium chloride infusion  500 mL Intravenous Continuous Irene Shipper, MD  Physical Exam BP 127/78   Pulse 65   Resp 20   Ht '5\' 10"'$  (1.778 m)   Wt 185 lb (83.9 kg)   SpO2 95% Comment: RA  BMI 26.77 kg/m  81 year old man in no acute distress Alert and oriented x3 with no focal deficits Well-developed and well-nourished Lungs essentially equal breath sounds bilaterally Cardiac regular rate and rhythm  Diagnostic Tests: CHEST - 2 VIEW   COMPARISON:  04/06/2021   FINDINGS: The heart size and mediastinal contours are within normal limits. Both lungs are clear. Disc degenerative disease of the thoracic spine.   IMPRESSION: No acute abnormality of the lungs.     Electronically Signed   By: Delanna Ahmadi M.D.   On: 04/12/2022 11:09 I personally reviewed the chest x-ray images.  Diaphragms are relatively the same although it is likely because of slightly less deep inspiration on the  right rather than complete resolution on the left.  I also reviewed his PET/CT images.  There are several small nodes in the chest as well as some larger nodes in the retroperitoneum that are hypermetabolic.  One in relative close proximity to his previous surgical site but I suspect that is prostate related as his thymic lesion was benign.  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  He had resection of the anterior mediastinal cyst in 2016 via a left VATS approach.  He has had no evidence of recurrence.  He has been troubled with left phrenic nerve dysfunction.  His sniff test did show some movement of the left diaphragm, but he does still have some dyspnea with exertion.  Symptoms have remained stable over time.  Prostate cancer-evidence of progression on recent PET/CT.  Awaiting evaluation at [facility] for different treatment studies for which he might be eligible.  Plan: Continue to follow-up with urology Return in 1 year with PA and lateral chest x-ray  Melrose Nakayama, MD [facility] [phone]

## 2023-05-02 ENCOUNTER — Ambulatory Visit: Admission: RE | Admit: 2023-05-02 | Payer: PPO | Source: Ambulatory Visit

## 2023-05-02 ENCOUNTER — Ambulatory Visit: Payer: PPO | Admitting: Thoracic Surgery (Cardiothoracic Vascular Surgery)

## 2023-05-02 ENCOUNTER — Other Ambulatory Visit: Payer: Self-pay | Admitting: Thoracic Surgery (Cardiothoracic Vascular Surgery)

## 2023-05-02 VITALS — BP 116/66 | HR 75 | Resp 20 | Ht 70.0 in | Wt 185.0 lb

## 2023-05-02 DIAGNOSIS — J986 Disorders of diaphragm: Secondary | ICD-10-CM

## 2023-05-02 DIAGNOSIS — D4989 Neoplasm of unspecified behavior of other specified sites: Secondary | ICD-10-CM | POA: Diagnosis not present

## 2023-05-02 DIAGNOSIS — Z9089 Acquired absence of other organs: Secondary | ICD-10-CM

## 2023-05-02 NOTE — Progress Notes (Signed)
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  Underwent minimally invasive thymectomy via a left VATS and April 2016.  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  I saw him in the office a year ago.  He was awaiting a plan for treatment of recurrence of his prostate cancer.  He went to [facility] for that and was treated with a combination of medications.  He had a good response to that.  He says he was very fatigued during his treatment with about 30% of his normal energy level.  It is improved now to about 70%.  Exercise tolerance is good on level ground but he does get short of breath rapidly with walking up stairs or an incline.  No chest pain, pressure, or tightness.  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    Colon polyps    hyperplastic and tubular adenomatous   Diverticulosis    Dysrhythmia    hx at fib- ablation 8 yrs ago   Erectile dysfunction following radical prostatectomy    Hypercapnic respiratory failure, chronic (HCC) 05/01/2015   Mediastinal mass    per CT CHEST 11/07/14 @ [facility].[name] KitchenANTERIOR...4.2 X 3.3 cm soft tissue   Neuromuscular disorder (HCC)    Prostate cancer [facility])    prostate cancer   Radiation    for prostate cancer   Radiation proctitis    rectum   Recurrent laryngeal nerve palsy    Respiratory failure (HCC)    s/p phrenic nerve palsy    Thrombocytopenia (HCC)    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     aspirin 81 MG EC tablet aspirin 81 mg tablet,delayed release  Take 1 tablet every day by oral route. (Patient not taking: Reported on 05/02/2023)     doxycycline (ORACEA) 40 MG capsule Take 40 mg by mouth every morning.     ergocalciferol (VITAMIN D2) 1.25 MG (50000 UT) capsule ergocalciferol (vitamin D2) 1,250 mcg (50,000 unit) capsule  TAKE 1 CAPSULE BY MOUTH TWICE A WEEK     FLUAD QUADRIVALENT 0.5 ML injection      gatifloxacin (ZYMAXID) 0.5 % SOLN Place 1 drop into the right eye 4 (four) times daily.     meloxicam (MOBIC) 15 MG tablet Take 7.5 mg by mouth 2 (two) times daily.  (Patient not taking: Reported on 05/02/2023)     mupirocin ointment (BACTROBAN) 2 % Apply 1 application. topically 2 (two) times daily. Apply to toe twice daily and cover with a bandaid 30 g 2   omeprazole (PRILOSEC) 20 MG capsule Take 20 mg by mouth daily.     prednisoLONE acetate (PRED FORTE) 1 % ophthalmic suspension Place 1 drop into the right eye 4 (four) times daily.     PROLENSA 0.07 % SOLN Place  1 drop into the right eye at bedtime.     Current Facility-Administered Medications  Medication Dose Route Frequency Provider Last Rate Last Admin   0.9 %  sodium chloride infusion  500 mL Intravenous Continuous Hilarie Fredrickson, MD        Physical Exam BP 116/66   Pulse 75   Resp 20   Ht 5\' 10"  (1.778 m)   Wt 185 lb (83.9 kg)   SpO2 94% Comment: RA  BMI 26.54 kg/m  Well-appearing 82 year old man in no acute distress Alert and oriented x 3 with no focal deficits Lungs equal breath sounds bilaterally Cardiac regular rate and rhythm  Diagnostic Tests: I personally reviewed the chest x-ray images.  Radiology reading is pending. No effusions or infiltrates.  No mediastinal widening.  Diaphragms symmetric.  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   Underwent minimally invasive thymectomy via a left VATS and April 2016.  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  Cystic thymoma-resected in 2016.  Nothing suspicious for recurrence on a plain chest x-ray.  He had a PET/CT a year ago which showed no signs of an anterior mediastinal abnormality.  Left recurrent and phrenic nerve dysfunction postoperatively-improvement but not complete resolution of symptoms.  He has pretty good exercise tolerance on level ground but rapidly gets short of breath with walking up stairs or an incline.  Recurrent prostate cancer-good response to dual medication clinical trial.  Plan: Return in 1 year with PA and lateral chest x-ray  Loreli Slot, MD [facility] [phone]

## 2023-07-31 ENCOUNTER — Telehealth: Payer: Self-pay | Admitting: *Deleted

## 2023-07-31 NOTE — Telephone Encounter (Signed)
Okay.  Thanks for the FYI   Dr. Odis Hollingshead

## 2023-07-31 NOTE — Telephone Encounter (Signed)
Pt has been scheduled IN OFFICE NEW PT APPT FOR PRE OP CLEARANCE 08/01/23 Dr. Odis Hollingshead. I will update all parties involved.

## 2023-07-31 NOTE — Telephone Encounter (Signed)
   Pre-operative Risk Assessment    Patient Name: [name] [name]  DOB: Mar 19, 1941 MRN: [id number]  DATE OF LAST VISIT: 07/27/20 DR. CRENSHAW DATE OF NEXT VISIT: NONE; PT WILL NEED A NEW PT BEFORE HIS SURGERY.     Request for Surgical Clearance    Procedure:   LEFT TOTAL KNEE ARTHROPLASTY  Date of Surgery:  Clearance 10/30/23                                 Surgeon:  DR. Ollen Gross Surgeon's Group or Practice Name:  [facility] Phone number:  [phone] Aida Raider Fax number:  [phone]   Type of Clearance Requested:   - Medical ; NONE INDICATED TO BE HELD; MED LIST REFLECTS ASA THOUGH NOTE SAYS PT NOT TAKING   Type of Anesthesia:   CHOICE   Additional requests/questions:    Elpidio Anis   07/31/2023, 11:18 AM

## 2023-08-01 ENCOUNTER — Ambulatory Visit: Payer: PPO | Attending: Cardiology | Admitting: Cardiology

## 2023-08-01 ENCOUNTER — Encounter: Payer: Self-pay | Admitting: Cardiology

## 2023-08-01 VITALS — BP 120/76 | HR 74 | Resp 16 | Ht 70.0 in | Wt 184.0 lb

## 2023-08-01 DIAGNOSIS — I480 Paroxysmal atrial fibrillation: Secondary | ICD-10-CM

## 2023-08-01 DIAGNOSIS — R0609 Other forms of dyspnea: Secondary | ICD-10-CM

## 2023-08-01 DIAGNOSIS — Z8679 Personal history of other diseases of the circulatory system: Secondary | ICD-10-CM

## 2023-08-01 DIAGNOSIS — R9431 Abnormal electrocardiogram [ECG] [EKG]: Secondary | ICD-10-CM | POA: Diagnosis not present

## 2023-08-01 DIAGNOSIS — Z01818 Encounter for other preprocedural examination: Secondary | ICD-10-CM

## 2023-08-01 DIAGNOSIS — Z9889 Other specified postprocedural states: Secondary | ICD-10-CM

## 2023-08-01 LAB — LAB REPORT - SCANNED
A1c: 6.3
EGFR: 91

## 2023-08-01 MED ORDER — METOPROLOL TARTRATE 25 MG PO TABS: 25.0000 mg | ORAL_TABLET | Freq: Two times a day (BID) | ORAL | 0 refills | Status: DC

## 2023-08-01 NOTE — Patient Instructions (Addendum)
Medication Instructions:  Your physician has recommended you make the following change in your medication:   START Metoprolol Tartrate (Lopressor) 25 mg twice daily 1 week before your cardiac CT. Once your cardiac CT is complete, stop taking the Metoprolol Tartrate (Lopressor).   -HOLD medication if systolic blood pressure (top number) is less than 100 OR if heart rate is less than 55   *If you need a refill on your cardiac medications before your next appointment, please call your pharmacy*  Lab Work: TODAY BMP  If you have labs (blood work) drawn today and your tests are completely normal, you will receive your results only by: MyChart Message (if you have MyChart) OR A paper copy in the mail If you have any lab test that is abnormal or we need to change your treatment, we will call you to review the results.  Testing/Procedures: Your physician has requested that you have an echocardiogram. Echocardiography is a painless test that uses sound waves to create images of your heart. It provides your doctor with information about the size and shape of your heart and how well your heart's chambers and valves are working. This procedure takes approximately one hour. There are no restrictions for this procedure. Please do NOT wear cologne, perfume, aftershave, or lotions (deodorant is allowed). Please arrive 15 minutes prior to your appointment time.  Please note: We ask at that you not bring children with you during ultrasound (echo/ vascular) testing. Due to room size and safety concerns, children are not allowed in the ultrasound rooms during exams. Our front office staff cannot provide observation of children in our lobby area while testing is being conducted. An adult accompanying a patient to their appointment will only be allowed in the ultrasound room at the discretion of the ultrasound technician under special circumstances. We apologize for any inconvenience.  Your physician has  requested that you have cardiac CT. Cardiac computed tomography (CT) is a painless test that uses an x-ray machine to take clear, detailed pictures of your heart. For further information please visit [email or web address] Please follow instruction sheet as given.    Follow-Up: At [facility], you and your health needs are our priority.  As part of our continuing mission to provide you with exceptional heart care, we have created designated Provider Care Teams.  These Care Teams include your primary Cardiologist (physician) and Advanced Practice Providers (APPs -  Physician Assistants and Nurse Practitioners) who all work together to provide you with the care you need, when you need it.  We recommend signing up for the patient portal called "MyChart".  Sign up information is provided on this After Visit Summary.  MyChart is used to connect with patients for Virtual Visits (Telemedicine).  Patients are able to view lab/test results, encounter notes, upcoming appointments, etc.  Non-urgent messages can be sent to your provider as well.   To learn more about what you can do with MyChart, go to [email or web address].    Your next appointment:   1 year(s)  The format for your next appointment:   In Person  Provider:   Tessa Lerner, DO {  Other Instructions   Your cardiac CT will be scheduled at one of the below locations:   [facility] [address] [place], [place] [address] [phone]   If scheduled at [facility], please arrive at the [facility] and Children's Entrance (Entrance C2) of [facility] 30 minutes prior to test start time. You can use the FREE  valet parking offered at entrance C (encouraged to control the heart rate for the test)  Proceed to the [facility] Radiology Department (first floor) to check-in and test prep.  All radiology patients and guests should use entrance C2 at [facility], accessed from [facility], even  though the hospital's physical address listed is [address]    Please follow these instructions carefully (unless otherwise directed):  An IV will be required for this test and Nitroglycerin will be given.  Hold all erectile dysfunction medications at least 3 days (72 hrs) prior to test. (Ie viagra, cialis, sildenafil, tadalafil, etc)   On the Night Before the Test: Be sure to Drink plenty of water. Do not consume any caffeinated/decaffeinated beverages or chocolate 12 hours prior to your test. Do not take any antihistamines 12 hours prior to your test.  On the Day of the Test: Drink plenty of water until 1 hour prior to the test. Do not eat any food 1 hour prior to test. You may take your regular medications prior to the test.  Take metoprolol (Lopressor) two hours prior to test.      After the Test: Drink plenty of water. After receiving IV contrast, you may experience a mild flushed feeling. This is normal. On occasion, you may experience a mild rash up to 24 hours after the test. This is not dangerous. If this occurs, you can take Benadryl 25 mg and increase your fluid intake. If you experience trouble breathing, this can be serious. If it is severe call 911 IMMEDIATELY. If it is mild, please call our office. If you take any of these medications: Glipizide/Metformin, Avandament, Glucavance, please do not take 48 hours after completing test unless otherwise instructed.  We will call to schedule your test 2-4 weeks out understanding that some insurance companies will need an authorization prior to the service being performed.   For more information and frequently asked questions, please visit our website : [email or web address]  For non-scheduling related questions, please contact the cardiac imaging nurse navigator should you have any questions/concerns: Cardiac Imaging Nurse Navigators Direct Office Dial: [phone]   For scheduling needs, including  cancellations and rescheduling, please call [place], [phone].

## 2023-08-01 NOTE — Progress Notes (Signed)
Cardiology Office Note:    Date:  08/01/2023  NAME:  [name] [name]    MRN: [id number] DOB:  10-15-1940   PCP:  Dois Davenport, MD  Former Cardiology Providers: Dr. Sharon Seller, Dr. Jens Som Primary Cardiologist:  Tessa Lerner, DO, [facility] (established care 08/01/2023) Electrophysiologist:  None   Referring MD: Dois Davenport, MD  Reason of Consult: Preoperative risk stratification  Chief Complaint  Patient presents with   Pre-op Exam   New Patient (Initial Visit)    History of Present Illness:    [name] [name] is a 82 y.o. 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 He is being seen today for the evaluation of preoperative risk stratification at the request of Dois Davenport, MD.  Patient is tentatively scheduled to have a left knee replacement in February 2024 with Dr. Antony Odea.  Patient was referred to cardiology for further evaluation and management.  Patient denies any anginal chest pain.  But he does have shortness of breath with exertion.  His shortness of breath does not occur when he walks at a level ground but when he goes up a flight of stairs or walks on an incline he does get short of breath.  However the degree of his shortness of breath could be secondary to his prior left phrenic and recurrent nerve dysfunction however cardiac reasons cannot be entirely ruled out.  Patient also carries a history of paroxysmal atrial fibrillation and underwent radiofrequency ablation approximately 16 years ago according to him.  Since then him and his provider had decided to discontinue AV nodal blocking agents as well as anticoagulation.  His  current CHA2DS2-VASc score is 2.  Patient states that he has not had any reoccurrence of A-fib since the ablation to the best of his knowledge.  Current Medications: Current Meds  Medication Sig   metoprolol tartrate (LOPRESSOR) 25 MG tablet Take 1 tablet (25 mg total) by mouth 2 (two) times daily. HOLD medication if systolic blood pressure (top number) is less than 100 OR if your heart rate is less than 55 beats per minute. Start taking 1 week before your cardiac CT scan. Once your scan is complete, STOP taking this medication completely.   Current Facility-Administered Medications for the 08/01/23 encounter (Office Visit) with Tessa Lerner, DO  Medication   0.9 %  sodium chloride infusion     Allergies:    Patient has no known allergies.   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    Colon polyps    hyperplastic and tubular adenomatous   Diverticulosis    Dysrhythmia    hx at fib- ablation 8 yrs ago   Erectile dysfunction following radical prostatectomy    Hypercapnic respiratory failure, chronic (HCC) 05/01/2015   Mediastinal mass    per CT CHEST 11/07/14 @ [facility].[name] KitchenANTERIOR...4.2 X 3.3 cm soft tissue   Neuromuscular disorder (HCC)    Prostate cancer [facility])    prostate cancer   Radiation    for prostate cancer   Radiation proctitis  rectum   Recurrent laryngeal nerve palsy    Respiratory failure (HCC)    s/p phrenic nerve palsy    Thrombocytopenia (HCC)    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     Past Surgical History: Past Surgical History:  Procedure Laterality Date   ATRIAL ABLATION SURGERY     for a fib x 5 years ago   BACK SURGERY     COLONOSCOPY     CYSTOSCOPY N/A 11/26/2014   Procedure: CYSTOSCOPY FLEXIBLE;  Surgeon:  Alfredo Martinez, MD;  Location: [facility] OR;  Service: Urology;  Laterality: N/A;   CYSTOSCOPY WITH URETHRAL DILATATION N/A 11/26/2014   Procedure: CYSTOSCOPY WITH URETHRAL DILATATION WITH INSERTION OF FOLEY;  Surgeon: Alfredo Martinez, MD;  Location: [facility] OR;  Service: Urology;  Laterality: N/A;   FLEXIBLE SIGMOIDOSCOPY  02/10/2012   Procedure: FLEXIBLE SIGMOIDOSCOPY;  Surgeon: Hilarie Fredrickson, MD;  Location: [facility];  Service: Endoscopy;  Laterality: N/A;  need APC    KNEE ARTHROSCOPY Left    LEAD REMOVAL N/A 11/26/2014   Procedure: CRYO INTERCOSTAL NERVE BLOCK;  Surgeon: Loreli Slot, MD;  Location: [facility] OR;  Service: Thoracic;  Laterality: N/A;   POLYPECTOMY     PROSTATE SURGERY     RESECTION OF MEDIASTINAL MASS N/A 11/26/2014   Procedure: RESECTION OF ANTERIOR MEDIASTINAL MASS;  Surgeon: Loreli Slot, MD;  Location: [facility] OR;  Service: Thoracic;  Laterality: N/A;   ROBOT ASSISTED LAPAROSCOPIC RADICAL PROSTATECTOMY  2009   radiation Tx 2011   TONSILLECTOMY     VASECTOMY     VIDEO ASSISTED THORACOSCOPY Left 11/26/2014   Procedure: VIDEO ASSISTED THORACOSCOPY;  Surgeon: Loreli Slot, MD;  Location: [facility] OR;  Service: Thoracic;  Laterality: Left;    Social History: Social History   Tobacco Use   Smoking status: Never   Smokeless tobacco: Never  Vaping Use   Vaping status: Never Used  Substance Use Topics   Alcohol use: No    Alcohol/week: 0.0 standard drinks of alcohol   Drug use: No    Family History: Family History  Problem Relation Age of Onset   Prostate cancer Father    Heart disease Mother    Heart attack Mother    Healthy Sister    Healthy Sister    Colon cancer Neg Hx    Esophageal cancer Neg Hx    Rectal cancer Neg Hx    Stomach cancer Neg Hx    Pancreatic cancer Neg Hx     ROS:   Review of Systems  Cardiovascular:  Positive for dyspnea on exertion. Negative for chest pain, claudication, irregular heartbeat, leg swelling, near-syncope, orthopnea,  palpitations, paroxysmal nocturnal dyspnea and syncope.  Respiratory:  Negative for shortness of breath.   Hematologic/Lymphatic: Negative for bleeding problem.    EKGs/Labs/Other Studies Reviewed:   EKG Interpretation Date/Time:  Tuesday August 01 2023 11:28:00 EST  Text Interpretation: Normal sinus rhythm Nonspecific ST abnormality When compared with ECG of 28-Nov-2014 16:06, Sinus rhythm has replaced Atrial fibrillation ST now depressed in Anterior leads Nonspecific T wave abnormality, improved in Lateral leads Confirmed by Tessa Lerner [phone]) on 08/01/2023 11:42:08 AM   Click Here to Calculate/Change CHADS2VASc Score The patient's CHADS2-VASc score is 2, indicating a 2.2% annual risk of stroke.   CHF History: No HTN History: No Diabetes History: No Stroke History: No Vascular Disease History: No   Labs:    Latest Ref Rng & Units 11/28/2014    5:38 AM 11/27/2014  4:00 AM 11/24/2014   11:09 AM  CBC  WBC 4.0 - 10.5 K/uL 11.3  10.6  5.0   Hemoglobin 13.0 - 17.0 g/dL 56.3  87.5  64.3   Hematocrit 39.0 - 52.0 % 38.0  37.5  43.2   Platelets 150 - 400 K/uL 163  176  188        Latest Ref Rng & Units 11/28/2014    5:38 AM 11/27/2014    4:00 AM 11/24/2014   11:09 AM  BMP  Glucose 70 - 99 mg/dL 329  518  83   BUN 6 - 23 mg/dL 15  17  20    Creatinine 0.50 - 1.35 mg/dL 8.41  6.60  6.30   Sodium 135 - 145 mmol/L 135  136  137   Potassium 3.5 - 5.1 mmol/L 3.8  3.5  4.3   Chloride 96 - 112 mmol/L 100  105  107   CO2 19 - 32 mmol/L 29  26  25    Calcium 8.4 - 10.5 mg/dL 8.5  8.3  9.6       Latest Ref Rng & Units 11/28/2014    5:38 AM 11/27/2014    4:00 AM 11/24/2014   11:09 AM  CMP  Glucose 70 - 99 mg/dL 160  109  83   BUN 6 - 23 mg/dL 15  17  20    Creatinine 0.50 - 1.35 mg/dL 3.23  5.57  3.22   Sodium 135 - 145 mmol/L 135  136  137   Potassium 3.5 - 5.1 mmol/L 3.8  3.5  4.3   Chloride 96 - 112 mmol/L 100  105  107   CO2 19 - 32 mmol/L 29  26  25    Calcium 8.4 - 10.5 mg/dL  8.5  8.3  9.6   Total Protein 6.0 - 8.3 g/dL 5.8   7.1   Total Bilirubin 0.3 - 1.2 mg/dL 1.5   0.8   Alkaline Phos 39 - 117 U/L 57   79   AST 0 - 37 U/L 20   24   ALT 0 - 53 U/L 15   19     Lab Results  Component Value Date   CHOL 197 08/15/2013   HDL 40 08/15/2013   LDLCALC 137 (H) 08/15/2013   TRIG 98 08/15/2013   CHOLHDL 4.9 08/15/2013   No results for input(s): "LIPOA" in the last 8760 hours. No components found for: "NTPROBNP" No results for input(s): "PROBNP" in the last 8760 hours. No results for input(s): "TSH" in the last 8760 hours.  Physical Exam:    Today's Vitals   08/01/23 1124  BP: 120/76  Pulse: 74  Resp: 16  SpO2: 94%  Weight: 184 lb (83.5 kg)  Height: 5\' 10"  (1.778 m)   Body mass index is 26.4 kg/m. Wt Readings from Last 3 Encounters:  08/01/23 184 lb (83.5 kg)  05/02/23 185 lb (83.9 kg)  04/12/22 185 lb (83.9 kg)    Physical Exam  Constitutional: No distress.  hemodynamically stable  Neck: No JVD present.  Cardiovascular: Normal rate, regular rhythm, S1 normal and S2 normal. Exam reveals no gallop, no S3 and no S4.  No murmur heard. Pulmonary/Chest: Effort normal and breath sounds normal. No stridor. He has no wheezes. He has no rales.  Abdominal: Soft. Bowel sounds are normal. He exhibits no distension. There is no abdominal tenderness.  Musculoskeletal:        General: No edema.     Cervical back: Neck  supple.  Neurological: He is alert and oriented to person, place, and time. He has intact cranial nerves (2-12).  Skin: Skin is warm.     Impression & Recommendation(s):  Impression:   ICD-10-CM   1. Pre-op evaluation  Z01.818 EKG 12-Lead    Basic metabolic panel    CT CORONARY MORPH W/CTA COR W/SCORE W/CA W/CM &/OR WO/CM    ECHOCARDIOGRAM COMPLETE    metoprolol tartrate (LOPRESSOR) 25 MG tablet    Basic metabolic panel    2. Dyspnea on exertion  R06.09 Basic metabolic panel    CT CORONARY MORPH W/CTA COR W/SCORE W/CA W/CM &/OR  WO/CM    ECHOCARDIOGRAM COMPLETE    metoprolol tartrate (LOPRESSOR) 25 MG tablet    Basic metabolic panel    3. Abnormal EKG  R94.31     4. Paroxysmal atrial fibrillation (HCC)  I48.0     5. S/P ablation of atrial fibrillation  Z98.890    Z86.79        Recommendation(s):  Pre-op evaluation Patient is being scheduled for left knee replacement on October 30, 2023 with Dr. Ollen Gross. Further recommendations to follow  Dyspnea on exertion Abnormal EKG His dyspnea very well could be secondary to his phrenic/recurrent nerve injury However, current EKG also notes nonspecific ST changes and no recent cardiac workup. Shared decision was to proceed with echocardiogram and coronary CTA. BMP prior to the coronary CTA. Lopressor 25 mg p.o. twice daily for a week prior to his coronary CTA. Further recommendations to follow.  Paroxysmal atrial fibrillation (HCC) S/P ablation of atrial fibrillation Patient states that he has had an atrial fibrillation ablation approximately 16 years ago and since then has been in normal sinus rhythm.  He is not on AV nodal blocking agents for thromboembolic prophylaxis given his CHA2DS2-VASc score.  His CHA2DS2-VASc score is 2 and aspirin 81 mg p.o. daily could be considered.  However he would like to hold off on medical therapy at this time.  He is aware that if he develops other comorbid conditions his CHA2DS2-VASc score would likely change in medical treatment would need to be reconsidered.  Patient recently had labs with PCP-records will be requested. Further recommendations to follow.  Orders Placed:  Orders Placed This Encounter  Procedures   CT CORONARY MORPH W/CTA COR W/SCORE W/CA W/CM &/OR WO/CM    Standing Status:   Future    Standing Expiration Date:   07/31/2024    Order Specific Question:   If indicated for the ordered procedure, I authorize the administration of contrast media per Radiology protocol    Answer:   Yes    Order Specific  Question:   Initiate Coronary CTA Adult Protocol    Answer:   Yes    Order Specific Question:   If indicated initiate Post Coronary CTA Hypotension Adult Protocol    Answer:   Yes    Order Specific Question:   Does the patient have a contrast media/X-ray dye allergy?    Answer:   No    Order Specific Question:   Authorization:    Answer:   FFR will be ordered if deemed medically necessary   Basic metabolic panel    Standing Status:   Future    Number of Occurrences:   1    Standing Expiration Date:   07/31/2024   EKG 12-Lead   ECHOCARDIOGRAM COMPLETE    Standing Status:   Future    Standing Expiration Date:   07/31/2024    Order  Specific Question:   Where should this test be performed    Answer:   [facility])    Order Specific Question:   Does the patient weigh less than or greater than 250 lbs?    Answer:   Patient weighs less than 250 lbs    Order Specific Question:   Perflutren DEFINITY (image enhancing agent) should be administered unless hypersensitivity or allergy exist    Answer:   Administer Perflutren    Order Specific Question:   Reason for exam-Echo    Answer:   Other-Full Diagnosis List    Order Specific Question:   Full ICD-10/Reason for Exam    Answer:   Pre-operative clearance [098119]    Order Specific Question:   Full ICD-10/Reason for Exam    Answer:   Shortness of breath [786.05.ICD-9-CM]    As part of medical decision making results of the EKG, prior notes from Dr. Jens Som from 2021, were reviewed independently at today's visit.   Final Medication List:    Meds ordered this encounter  Medications   metoprolol tartrate (LOPRESSOR) 25 MG tablet    Sig: Take 1 tablet (25 mg total) by mouth 2 (two) times daily. HOLD medication if systolic blood pressure (top number) is less than 100 OR if your heart rate is less than 55 beats per minute. Start taking 1 week before your cardiac CT scan. Once your scan is complete, STOP taking this  medication completely.    Dispense:  14 tablet    Refill:  0    Medications Discontinued During This Encounter  Medication Reason   PROLENSA 0.07 % SOLN    prednisoLONE acetate (PRED FORTE) 1 % ophthalmic suspension    omeprazole (PRILOSEC) 20 MG capsule    mupirocin ointment (BACTROBAN) 2 %    meloxicam (MOBIC) 15 MG tablet    gatifloxacin (ZYMAXID) 0.5 % SOLN    FLUAD QUADRIVALENT 0.5 ML injection    ergocalciferol (VITAMIN D2) 1.25 MG (50000 UT) capsule    doxycycline (ORACEA) 40 MG capsule    aspirin 81 MG EC tablet      Current Outpatient Medications:    metoprolol tartrate (LOPRESSOR) 25 MG tablet, Take 1 tablet (25 mg total) by mouth 2 (two) times daily. HOLD medication if systolic blood pressure (top number) is less than 100 OR if your heart rate is less than 55 beats per minute. Start taking 1 week before your cardiac CT scan. Once your scan is complete, STOP taking this medication completely., Disp: 14 tablet, Rfl: 0   ibuprofen (ADVIL) 100 MG tablet, Take 100 mg by mouth every 6 (six) hours as needed for fever (PRN)., Disp: , Rfl:   Current Facility-Administered Medications:    0.9 %  sodium chloride infusion, 500 mL, Intravenous, Continuous, Hilarie Fredrickson, MD  Consent:   N/A  Disposition:   1 year follow-up recommended. Patient may be asked to follow-up sooner based on the results of the above-mentioned testing.  His questions and concerns were addressed to his satisfaction. He voices understanding of the recommendations provided during this encounter.    Signed, Tessa Lerner, DO, [facility]  [facility]  [address] [place], [place] [address] 08/01/2023 4:45 PM

## 2023-08-14 ENCOUNTER — Encounter (HOSPITAL_COMMUNITY): Payer: Self-pay

## 2023-08-16 ENCOUNTER — Telehealth (HOSPITAL_COMMUNITY): Payer: Self-pay | Admitting: *Deleted

## 2023-08-16 NOTE — Telephone Encounter (Signed)
Reaching out to patient to offer assistance regarding upcoming cardiac imaging study; pt verbalizes understanding of appt date/time, parking situation and where to check in, pre-test NPO status and medications ordered, and verified current allergies; name and call back number provided for further questions should they arise Hayley Sharpe RN Navigator Cardiac [facility] [phone] office [phone] cell  

## 2023-08-17 ENCOUNTER — Ambulatory Visit (HOSPITAL_COMMUNITY)
Admission: RE | Admit: 2023-08-17 | Discharge: 2023-08-17 | Disposition: A | Discharge status: Home / Self Care | Payer: PPO | Source: Ambulatory Visit | Attending: Cardiology | Admitting: Cardiology

## 2023-08-17 DIAGNOSIS — R0609 Other forms of dyspnea: Secondary | ICD-10-CM | POA: Insufficient documentation

## 2023-08-17 DIAGNOSIS — I2510 Atherosclerotic heart disease of native coronary artery without angina pectoris: Secondary | ICD-10-CM | POA: Insufficient documentation

## 2023-08-17 DIAGNOSIS — R931 Abnormal findings on diagnostic imaging of heart and coronary circulation: Secondary | ICD-10-CM | POA: Diagnosis present

## 2023-08-17 DIAGNOSIS — Z01818 Encounter for other preprocedural examination: Secondary | ICD-10-CM | POA: Insufficient documentation

## 2023-08-17 MED ORDER — IOHEXOL 350 MG/ML SOLN
100.0000 mL | Freq: Once | INTRAVENOUS | Status: AC | PRN
  Administered 2023-08-17: 100 mL via INTRAVENOUS

## 2023-08-17 MED ORDER — NITROGLYCERIN 0.4 MG SL SUBL
SUBLINGUAL_TABLET | SUBLINGUAL | Status: AC
  Filled 2023-08-17: qty 2

## 2023-08-17 MED ORDER — NITROGLYCERIN 0.4 MG SL SUBL
0.8000 mg | SUBLINGUAL_TABLET | Freq: Once | SUBLINGUAL | Status: AC
  Administered 2023-08-17: 0.8 mg via SUBLINGUAL

## 2023-08-17 NOTE — Progress Notes (Signed)
After CT and receiving nitroglycerin, BP 87/56. Patient asymptomatic. Patient taken back to nurses station to be monitored.  Given ginger ale. Recheck BP 113/75 Patient ambulated, remains asymptomatic.

## 2023-08-18 ENCOUNTER — Ambulatory Visit (HOSPITAL_BASED_OUTPATIENT_CLINIC_OR_DEPARTMENT_OTHER)
Admission: RE | Admit: 2023-08-18 | Discharge: 2023-08-18 | Disposition: A | Discharge status: Home / Self Care | Payer: PPO | Source: Ambulatory Visit | Attending: Cardiology | Admitting: Cardiology

## 2023-08-18 ENCOUNTER — Other Ambulatory Visit (HOSPITAL_COMMUNITY): Payer: Self-pay | Admitting: Emergency Medicine

## 2023-08-18 DIAGNOSIS — R931 Abnormal findings on diagnostic imaging of heart and coronary circulation: Secondary | ICD-10-CM

## 2023-08-18 DIAGNOSIS — Z01818 Encounter for other preprocedural examination: Secondary | ICD-10-CM | POA: Diagnosis not present

## 2023-09-11 ENCOUNTER — Ambulatory Visit (HOSPITAL_COMMUNITY): Payer: PPO | Attending: Cardiology

## 2023-09-11 DIAGNOSIS — Z01818 Encounter for other preprocedural examination: Secondary | ICD-10-CM | POA: Diagnosis present

## 2023-09-11 DIAGNOSIS — R0609 Other forms of dyspnea: Secondary | ICD-10-CM | POA: Insufficient documentation

## 2023-09-11 LAB — ECHOCARDIOGRAM COMPLETE: S' Lateral: 2.88 cm

## 2023-09-14 ENCOUNTER — Telehealth: Payer: Self-pay

## 2023-09-14 NOTE — Telephone Encounter (Signed)
 Attempted to call pt to see if he could come in for 09/15/23 2:20 PM appt slot at Dr. Emelda Brothers request. LMTCB.

## 2023-09-14 NOTE — Telephone Encounter (Signed)
 Patient returned call, I scheduled him for tomorrow (09/15/23)  at 2:20pm.

## 2023-09-15 ENCOUNTER — Ambulatory Visit: Payer: PPO | Attending: Cardiology | Admitting: Cardiology

## 2023-09-15 VITALS — BP 120/78 | HR 74 | Resp 16 | Ht 70.0 in | Wt 190.0 lb

## 2023-09-15 DIAGNOSIS — R0609 Other forms of dyspnea: Secondary | ICD-10-CM | POA: Diagnosis not present

## 2023-09-15 DIAGNOSIS — I2584 Coronary atherosclerosis due to calcified coronary lesion: Secondary | ICD-10-CM

## 2023-09-15 DIAGNOSIS — I2510 Atherosclerotic heart disease of native coronary artery without angina pectoris: Secondary | ICD-10-CM

## 2023-09-15 DIAGNOSIS — R0602 Shortness of breath: Secondary | ICD-10-CM

## 2023-09-15 DIAGNOSIS — Z9889 Other specified postprocedural states: Secondary | ICD-10-CM

## 2023-09-15 DIAGNOSIS — Z8679 Personal history of other diseases of the circulatory system: Secondary | ICD-10-CM

## 2023-09-15 DIAGNOSIS — I77810 Thoracic aortic ectasia: Secondary | ICD-10-CM

## 2023-09-15 DIAGNOSIS — I480 Paroxysmal atrial fibrillation: Secondary | ICD-10-CM

## 2023-09-15 DIAGNOSIS — R931 Abnormal findings on diagnostic imaging of heart and coronary circulation: Secondary | ICD-10-CM

## 2023-09-15 DIAGNOSIS — E7800 Pure hypercholesterolemia, unspecified: Secondary | ICD-10-CM

## 2023-09-15 DIAGNOSIS — Z01810 Encounter for preprocedural cardiovascular examination: Secondary | ICD-10-CM

## 2023-09-15 MED ORDER — ASPIRIN 81 MG PO TBEC: 81.0000 mg | DELAYED_RELEASE_TABLET | Freq: Every day | ORAL | Status: DC

## 2023-09-15 MED ORDER — ROSUVASTATIN CALCIUM 20 MG PO TABS: 20.0000 mg | ORAL_TABLET | Freq: Every day | ORAL | 3 refills | Status: DC

## 2023-09-15 MED ORDER — ASPIRIN 81 MG PO TBEC: 81.0000 mg | DELAYED_RELEASE_TABLET | Freq: Every day | ORAL | 12 refills | Status: DC

## 2023-09-15 NOTE — Patient Instructions (Addendum)
 Medication Instructions:  Your physician has recommended you make the following change in your medication:  START Aspirin  81 mg once daily  START Rosuvastatin  (Crestor ) 20 mg once daily at bedtime   *If you need a refill on your cardiac medications before your next appointment, please call your pharmacy*  Lab Work: TODAY: BMP and a CBC  To Be Completed in 6 WEEKS: FASTING lipid panel, direct LDL and CMP (approximately 2/14) If you have labs (blood work) drawn today and your tests are completely normal, you will receive your results only by: MyChart Message (if you have MyChart) OR A paper copy in the mail If you have any lab test that is abnormal or we need to change your treatment, we will call you to review the results.  Testing/Procedures: Your physician is recommending you have a heart catheterization.  Follow-Up: At [facility], you and your health needs are our priority.  As part of our continuing mission to provide you with exceptional heart care, we have created designated Provider Care Teams.  These Care Teams include your primary Cardiologist (physician) and Advanced Practice Providers (APPs -  Physician Assistants and Nurse Practitioners) who all work together to provide you with the care you need, when you need it.  We recommend signing up for the patient portal called MyChart.  Sign up information is provided on this After Visit Summary.  MyChart is used to connect with patients for Virtual Visits (Telemedicine).  Patients are able to view lab/test results, encounter notes, upcoming appointments, etc.  Non-urgent messages can be sent to your provider as well.   To learn more about what you can do with MyChart, go to [email or web address].    Your next appointment:   6 week(s)  The format for your next appointment:   In Person  Provider:   Madonna Large, DO {

## 2023-09-16 LAB — CBC
Hematocrit: 40.6 % (ref 37.5–51.0)
Hemoglobin: 13.3 g/dL (ref 13.0–17.7)
MCH: 29.4 pg (ref 26.6–33.0)
MCHC: 32.8 g/dL (ref 31.5–35.7)
MCV: 90 fL (ref 79–97)
Platelets: 253 10*3/uL (ref 150–450)
RBC: 4.52 x10E6/uL (ref 4.14–5.80)
RDW: 13.3 % (ref 11.6–15.4)
WBC: 6.3 10*3/uL (ref 3.4–10.8)

## 2023-09-16 LAB — BASIC METABOLIC PANEL
BUN/Creatinine Ratio: 17 (ref 10–24)
BUN: 15 mg/dL (ref 8–27)
CO2: 25 mmol/L (ref 20–29)
Calcium: 9.6 mg/dL (ref 8.6–10.2)
Chloride: 99 mmol/L (ref 96–106)
Creatinine, Ser: 0.87 mg/dL (ref 0.76–1.27)
Glucose: 103 mg/dL — ABNORMAL HIGH (ref 70–99)
Potassium: 4.7 mmol/L (ref 3.5–5.2)
Sodium: 141 mmol/L (ref 134–144)
eGFR: 87 mL/min/{1.73_m2} (ref >59)

## 2023-09-16 LAB — COMPREHENSIVE METABOLIC PANEL
ALT: 18 [IU]/L (ref 0–44)
AST: 18 [IU]/L (ref 0–40)
Albumin: 4.8 g/dL — ABNORMAL HIGH (ref 3.7–4.7)
Alkaline Phosphatase: 101 [IU]/L (ref 44–121)
BUN/Creatinine Ratio: 16 (ref 10–24)
BUN: 14 mg/dL (ref 8–27)
Bilirubin Total: 0.8 mg/dL (ref 0.0–1.2)
CO2: 24 mmol/L (ref 20–29)
Calcium: 9.7 mg/dL (ref 8.6–10.2)
Chloride: 98 mmol/L (ref 96–106)
Creatinine, Ser: 0.85 mg/dL (ref 0.76–1.27)
Globulin, Total: 2.3 g/dL (ref 1.5–4.5)
Glucose: 106 mg/dL — ABNORMAL HIGH (ref 70–99)
Potassium: 4.7 mmol/L (ref 3.5–5.2)
Sodium: 140 mmol/L (ref 134–144)
Total Protein: 7.1 g/dL (ref 6.0–8.5)
eGFR: 87 mL/min/{1.73_m2} (ref >59)

## 2023-09-16 LAB — LIPID PANEL
Chol/HDL Ratio: 3.4 {ratio} (ref 0.0–5.0)
Cholesterol, Total: 230 mg/dL — ABNORMAL HIGH (ref 100–199)
HDL: 67 mg/dL (ref >39)
LDL Chol Calc (NIH): 146 mg/dL — ABNORMAL HIGH (ref 0–99)
Triglycerides: 96 mg/dL (ref 0–149)
VLDL Cholesterol Cal: 17 mg/dL (ref 5–40)

## 2023-09-16 LAB — LDL CHOLESTEROL, DIRECT: LDL Direct: 145 mg/dL — ABNORMAL HIGH (ref 0–99)

## 2023-09-18 ENCOUNTER — Telehealth: Payer: Self-pay | Admitting: Cardiology

## 2023-09-18 NOTE — Telephone Encounter (Signed)
 Pt called in stating Dr. Odis Hollingshead wants to sch him a cath and he is calling to get it set up.

## 2023-09-19 ENCOUNTER — Encounter: Payer: Self-pay | Admitting: Cardiology

## 2023-09-19 NOTE — H&P (View-Only) (Signed)
Cardiology Office Note:    NAME:  [name] [name]    MRN: [id number] DOB:  11-21-1940   PCP:  Dois Davenport, MD  Former Cardiology Providers: Dr. Sharon Seller, Dr. Jens Som Primary Cardiologist:  Tessa Lerner, DO, [facility] (established care 08/01/2023) Electrophysiologist:  None   Chief Complaint  Patient presents with   Results    Reviewed the results of the coronary CTA   Follow-up    Dyspnea on exertion    History of Present Illness:    [name] [name] is a 83 y.o. 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   Patient was referred to practice for preoperative risk stratification prior to upcoming noncardiac surgery.  As part of preoperative risk stratification patient endorsed having shortness of breath with exertion.  Though he has several chronic comorbid conditions that could explain dyspnea but given the fact that the symptoms were more noticeable and quickly brought on raised the concern for possible underlying CAD given his age, untreated hyperlipidemia, and other cardiovascular risk factors.  In addition, he also had nonspecific ST-T changes.  Since last office visit he did undergo CCTA which notes severe CAC and CAD within the LAD and the RCA distribution.  Clinically denies anginal chest pain.  He continues to have dyspnea on exertion.  Patient is tentatively scheduled to have a left knee replacement in February 2024 with Dr. Antony Odea.    Patient also carries a history of paroxysmal atrial fibrillation and  underwent radiofrequency ablation approximately 16 years ago according to him.  Since then him and his provider had decided to discontinue AV nodal blocking agents as well as anticoagulation. Patient states that he has not had any reoccurrence of A-fib since the ablation to the best of his knowledge.  Current Medications: Current Meds  Medication Sig   [DISCONTINUED] aspirin EC 81 MG tablet Take 1 tablet (81 mg total) by mouth daily. Swallow whole.   [DISCONTINUED] rosuvastatin (CRESTOR) 20 MG tablet Take 1 tablet (20 mg total) by mouth daily.   Current Facility-Administered Medications for the 09/15/23 encounter (Office Visit) with Tessa Lerner, DO  Medication   0.9 %  sodium chloride infusion     Allergies:    Patient has no known allergies.   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    Colon polyps    hyperplastic and tubular adenomatous   Coronary atherosclerosis due to calcified coronary lesion    Diverticulosis    Dysrhythmia    hx at fib- ablation 8 yrs ago   Erectile dysfunction following radical prostatectomy    Hypercapnic respiratory failure, chronic (HCC) 05/01/2015   Mediastinal mass    per CT CHEST 11/07/14 @ [facility].[name] KitchenANTERIOR...4.2 X 3.3 cm soft tissue   Neuromuscular disorder (HCC)    Prostate cancer [facility])    prostate cancer   Radiation    for prostate cancer   Radiation proctitis    rectum   Recurrent  laryngeal nerve palsy    Respiratory failure (HCC)    s/p phrenic nerve palsy    Thrombocytopenia (HCC)    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     Past Surgical History: Past Surgical History:  Procedure Laterality Date   ATRIAL ABLATION SURGERY     for a fib x 5 years ago   BACK SURGERY     COLONOSCOPY      CYSTOSCOPY N/A 11/26/2014   Procedure: CYSTOSCOPY FLEXIBLE;  Surgeon: Alfredo Martinez, MD;  Location: [facility] OR;  Service: Urology;  Laterality: N/A;   CYSTOSCOPY WITH URETHRAL DILATATION N/A 11/26/2014   Procedure: CYSTOSCOPY WITH URETHRAL DILATATION WITH INSERTION OF FOLEY;  Surgeon: Alfredo Martinez, MD;  Location: [facility] OR;  Service: Urology;  Laterality: N/A;   FLEXIBLE SIGMOIDOSCOPY  02/10/2012   Procedure: FLEXIBLE SIGMOIDOSCOPY;  Surgeon: Hilarie Fredrickson, MD;  Location: [facility];  Service: Endoscopy;  Laterality: N/A;  need APC    KNEE ARTHROSCOPY Left    LEAD REMOVAL N/A 11/26/2014   Procedure: CRYO INTERCOSTAL NERVE BLOCK;  Surgeon: Loreli Slot, MD;  Location: [facility] OR;  Service: Thoracic;  Laterality: N/A;   POLYPECTOMY     PROSTATE SURGERY     RESECTION OF MEDIASTINAL MASS N/A 11/26/2014   Procedure: RESECTION OF ANTERIOR MEDIASTINAL MASS;  Surgeon: Loreli Slot, MD;  Location: [facility] OR;  Service: Thoracic;  Laterality: N/A;   ROBOT ASSISTED LAPAROSCOPIC RADICAL PROSTATECTOMY  2009   radiation Tx 2011   TONSILLECTOMY     VASECTOMY     VIDEO ASSISTED THORACOSCOPY Left 11/26/2014   Procedure: VIDEO ASSISTED THORACOSCOPY;  Surgeon: Loreli Slot, MD;  Location: [facility] OR;  Service: Thoracic;  Laterality: Left;    Social History: Social History   Tobacco Use   Smoking status: Never   Smokeless tobacco: Never  Vaping Use   Vaping status: Never Used  Substance Use Topics   Alcohol use: No    Alcohol/week: 0.0 standard drinks of alcohol   Drug use: No    Family History: Family History  Problem Relation Age of Onset   Prostate cancer Father    Heart disease Mother    Heart attack Mother    Healthy Sister    Healthy Sister    Colon cancer Neg Hx    Esophageal cancer Neg Hx    Rectal cancer Neg Hx    Stomach cancer Neg Hx    Pancreatic cancer Neg Hx     ROS:   Review of Systems  Cardiovascular:  Positive for dyspnea on exertion. Negative for chest pain,  claudication, irregular heartbeat, leg swelling, near-syncope, orthopnea, palpitations, paroxysmal nocturnal dyspnea and syncope.  Respiratory:  Negative for shortness of breath.   Hematologic/Lymphatic: Negative for bleeding problem.    EKGs/Labs/Other Studies Reviewed:   Echocardiogram December 2024: 09/11/2023  1. Left ventricular ejection fraction, by estimation, is 55 to 60%. The left ventricle has normal function. The left ventricle has no regional wall motion abnormalities. Left ventricular diastolic function could not be evaluated.  2. Right ventricular systolic function is normal. The right ventricular size is normal. There is normal pulmonary artery systolic pressure.  3. The mitral valve is normal in structure. No evidence of mitral valve regurgitation. No evidence of mitral stenosis.  4. The aortic valve is normal in structure. There is mild calcification of the aortic valve. Aortic valve regurgitation is not visualized. No aortic stenosis is present.  5. Aortic dilatation noted. There is dilatation of the  aortic root, measuring 41 mm. There is dilatation of the ascending aorta, measuring 39 mm.  6. The inferior vena cava is normal in size with greater than 50% respiratory variability, suggesting right atrial pressure of 3 mmHg.  Coronary CTA December 2024: 1. Coronary artery calcium score 3221 Agatston units. This suggests high risk for future cardiac events.   2. Moderate stenosis distal LAD, borderline hemodynamic significance by FFR.   3. Moderate stenosis distal RCA into bifurcation of PLV and PDA. Could not be assessed by FFR. Looks to be of borderline hemodynamic significance.  4.  Noncardiac findings: 1. Ill-defined areas of ground-glass within the left lower lobe, not seen on prior exam, may be infectious or inflammatory. This is not in a configuration typical of hypoventilatory change. 2. Elevated right hemidiaphragm with adjacent compressive atelectasis or scarring. 3.  The right middle lobe nodule on prior PET is not confidently seen on the current exam. 4. Unchanged size of 4 mm posterior mediastinal node that was tracer avid on prior PET.   5. Aortic Atherosclerosis (ICD10-I70.0).  CT FFR December 2024: 1. Moderate stenosis distal LAD, borderline hemodynamic significance by FFR.   3. Moderate stenosis distal RCA into bifurcation of PLV and PDA. Could not be assessed by FFR. Looks to be of borderline hemodynamic significance.  Click Here to Calculate/Change CHADS2VASc Score The patient's CHADS2-VASc score is 3, indicating a 3.2% annual risk of stroke.   CHF History: No HTN History: No Diabetes History: No Stroke History: No Vascular Disease History: Yes   Labs:    Latest Ref Rng & Units 09/15/2023    3:37 PM 11/28/2014    5:38 AM 11/27/2014    4:00 AM  CBC  WBC 3.4 - 10.8 x10E3/uL 6.3  11.3  10.6   Hemoglobin 13.0 - 17.7 g/dL 09.8  11.9  14.7   Hematocrit 37.5 - 51.0 % 40.6  38.0  37.5   Platelets 150 - 450 x10E3/uL 253  163  176        Latest Ref Rng & Units 09/15/2023    3:55 PM 09/15/2023    3:37 PM 11/28/2014    5:38 AM  BMP  Glucose 70 - 99 mg/dL 829  562  130   BUN 8 - 27 mg/dL 14  15  15    Creatinine 0.76 - 1.27 mg/dL 8.65  7.84  6.96   BUN/Creat Ratio 10 - 24 16  17     Sodium 134 - 144 mmol/L 140  141  135   Potassium 3.5 - 5.2 mmol/L 4.7  4.7  3.8   Chloride 96 - 106 mmol/L 98  99  100   CO2 20 - 29 mmol/L 24  25  29    Calcium 8.6 - 10.2 mg/dL 9.7  9.6  8.5       Latest Ref Rng & Units 09/15/2023    3:55 PM 09/15/2023    3:37 PM 11/28/2014    5:38 AM  CMP  Glucose 70 - 99 mg/dL 295  284  132   BUN 8 - 27 mg/dL 14  15  15    Creatinine 0.76 - 1.27 mg/dL 4.40  1.02  7.25   Sodium 134 - 144 mmol/L 140  141  135   Potassium 3.5 - 5.2 mmol/L 4.7  4.7  3.8   Chloride 96 - 106 mmol/L 98  99  100   CO2 20 - 29 mmol/L 24  25  29    Calcium 8.6 - 10.2 mg/dL 9.7  9.6  8.5   Total Protein 6.0 - 8.5 g/dL 7.1   5.8   Total Bilirubin 0.0 -  1.2 mg/dL 0.8   1.5   Alkaline Phos 44 - 121 IU/L 101   57   AST 0 - 40 IU/L 18   20   ALT 0 - 44 IU/L 18   15     Lab Results  Component Value Date   CHOL 230 (H) 09/15/2023   HDL 67 09/15/2023   LDLCALC 146 (H) 09/15/2023   LDLDIRECT 145 (H) 09/15/2023   TRIG 96 09/15/2023   CHOLHDL 3.4 09/15/2023   No results for input(s): "LIPOA" in the last 8760 hours. No components found for: "NTPROBNP" No results for input(s): "PROBNP" in the last 8760 hours. No results for input(s): "TSH" in the last 8760 hours.  Physical Exam:    Today's Vitals   09/15/23 1424  BP: 120/78  Pulse: 74  Resp: 16  SpO2: 96%  Weight: 190 lb (86.2 kg)  Height: 5\' 10"  (1.778 m)   Body mass index is 27.26 kg/m. Wt Readings from Last 3 Encounters:  09/15/23 190 lb (86.2 kg)  08/01/23 184 lb (83.5 kg)  05/02/23 185 lb (83.9 kg)    Physical Exam  Constitutional: No distress.  hemodynamically stable  Neck: No JVD present.  Cardiovascular: Normal rate, regular rhythm, S1 normal and S2 normal. Exam reveals no gallop, no S3 and no S4.  No murmur heard. Pulmonary/Chest: Effort normal and breath sounds normal. No stridor. He has no wheezes. He has no rales.  Abdominal: Soft. Bowel sounds are normal. He exhibits no distension. There is no abdominal tenderness.  Musculoskeletal:        General: No edema.     Cervical back: Neck supple.  Neurological: He is alert and oriented to person, place, and time. He has intact cranial nerves (2-12).  Skin: Skin is warm.     Impression & Recommendation(s):  Impression:   ICD-10-CM   1. Coronary atherosclerosis due to calcified coronary lesion  I25.10 Basic metabolic panel   W10.27 CBC    CBC    Basic metabolic panel    rosuvastatin (CRESTOR) 20 MG tablet    aspirin EC 81 MG tablet    Lipid panel    LDL cholesterol, direct    Comprehensive metabolic panel    Comprehensive metabolic panel    LDL cholesterol, direct    Lipid panel    DISCONTINUED: aspirin  EC 81 MG tablet    DISCONTINUED: rosuvastatin (CRESTOR) 20 MG tablet    2. Agatston CAC score, >400  R93.1     3. Dyspnea on exertion  R06.09 Basic metabolic panel    CBC    CBC    Basic metabolic panel    4. Pure hypercholesterolemia  E78.00     5. Preoperative cardiovascular examination  Z01.810 Basic metabolic panel    CBC    CBC    Basic metabolic panel    6. Paroxysmal atrial fibrillation (HCC)  I48.0     7. S/P ablation of atrial fibrillation  Z98.890    Z86.79     8. Aortic root dilatation (HCC)  I77.810        Recommendation(s):  Coronary atherosclerosis due to calcified coronary lesion Agatston CAC score, >400 Dyspnea on exertion Presented to the office for preoperative risk stratification prior to upcoming surgery.  Clinically experiencing progressive dyspnea.  Subtle EKG changes.  And multiple cardiovascular risk factors.  Shared decision was  to proceed with coronary CTA to evaluate for CAC/CAD. Total CAC is 3221 -consistent with severe CAC.  He at least has moderate CAD based on coronary CTA in the LAD and RCA distribution. I independently reviewed the coronary CTA results and also reviewed the images with the patient.  He has diffuse LAD disease with predominant calcified disease in the distal one third of the vessel. CTFFR values are within the intermediate zone at the distal LAD and I suspected secondary to vessel tapering as opposed to true obstructive disease.  However, when evaluating the RCA disease he has bifurcating disease as a distal RCA bifurcates into the PLV and PDA.  CT FFR was not able to evaluate/model disease in the PLV/PDA distribution.  Given the fact that he has symptoms of dyspnea which could be his coronary equivalent and upcoming elective noncardiac surgery having a clear understanding of his coronary anatomy is important prior to subjecting him to anesthesia. I have explained to [name] my concerns in details. To better evaluate for the  severity of CAD recommended coronary angiography with possible intervention.  Risks, benefits, and alternatives were discussed with the patient during today's office visit.  Patient wanted to discuss with his close friend who is a former cardiologist at heart care prior to making a decision.  Prior to completing this office note he did reach out back to [place] stating that he would like to proceed with left heart catheterization with possible intervention.  Echo notes preserved LVEF, without any significant valvular heart disease, but dilatation of the aortic root and proximal ascending aorta.  This was not commented on during his recent coronary CTA.  Will follow disease progression with annual echocardiogram and if significant change in dimensions will confirm with CT chest.  Start aspirin 81 mg p.o. daily. Start Crestor 20 mg p.o. nightly, follow-up fasting lipids in 6 weeks to reevaluate therapy and CMP.  We also discussed using sublingual nitroglycerin as needed-but he would like to hold off on it.  Educated him on seeking medical attention sooner by going to the closest ER via EMS if the symptoms increase in intensity, frequency, duration, or has typical chest pain as discussed in the office.  Patient verbalized understanding.  Pure hypercholesterolemia Direct LDL 145 mg/dL as of January 2130. Given the severe CAC had a prolonged discussion with the patient the role of lipid-lowering agents.  Patient states that he has heard a lot about statins and its side effects and has chose not to be on this particular drug class in the past.  However given the severity of coronary artery disease I have strongly encouraged him to reconsider.  After discussing the risks, benefits, alternatives patient is willing to start statin therapy. If he is truly intolerant to statin therapy other options that we have discussed are nonstatin therapies such as Zetia, Nexlizet, Nexletol +/- PCSK9 inhibitors.  Preoperative  cardiovascular examination Tentatively scheduled to have a left knee replacement in February 2024 with Dr. Antony Odea.  Further recommendations to follow  Paroxysmal atrial fibrillation (HCC) S/P ablation of atrial fibrillation Patient states that he has had an atrial fibrillation ablation approximately 16 years ago and since then has been in normal sinus rhythm.   He is not on AV nodal blocking agents OR thromboembolic prophylaxis as his CHA2DS2-VASc score was 2.  However, given his CCTA he does have Aortic atherosclerosis and his CHA2DS2-VASc score is now 3.  We discuss the role of anticoagulation given his risk score - however, now he  is agreeable with ASA 81mg  po day.  We will discuss this further at later visit. An alternative to longterm OAC could be ASA + implantable loop recorder to monitor Afib burden (if present it would give the patient more objective data to make an more informed decision).  Given the fact that he is not on [facility] he verbalizes understanding that he is at risk of stoke; therefore, if he has stroke like symptoms he should go to the nearest ER via EMS for evaluation.   Of note, I reviewed the radiology over read from the recent coronary CTA.  I have asked him to follow-up with PCP to see if additional testing and/or monitoring is required.  Patient verbalized understanding.  Orders Placed:  Orders Placed This Encounter  Procedures   Basic metabolic panel    Standing Status:   Future    Number of Occurrences:   1    Expected Date:   09/15/2023    Expiration Date:   09/14/2024   CBC    Standing Status:   Future    Number of Occurrences:   1    Expected Date:   09/15/2023    Expiration Date:   09/14/2024   Lipid panel    Standing Status:   Future    Number of Occurrences:   1    Expected Date:   10/27/2023    Expiration Date:   09/14/2024   LDL cholesterol, direct    Standing Status:   Future    Number of Occurrences:   1    Expected Date:   10/27/2023    Expiration Date:    09/14/2024   Comprehensive metabolic panel    Standing Status:   Future    Number of Occurrences:   1    Expected Date:   10/27/2023    Expiration Date:   09/14/2024    Final Medication List:    Meds ordered this encounter  Medications   DISCONTD: aspirin EC 81 MG tablet    Sig: Take 1 tablet (81 mg total) by mouth daily. Swallow whole.    Dispense:  30 tablet    Refill:  12   DISCONTD: rosuvastatin (CRESTOR) 20 MG tablet    Sig: Take 1 tablet (20 mg total) by mouth daily.    Dispense:  90 tablet    Refill:  3   rosuvastatin (CRESTOR) 20 MG tablet    Sig: Take 1 tablet (20 mg total) by mouth at bedtime.    Dispense:  90 tablet    Refill:  3   aspirin EC 81 MG tablet    Sig: Take 1 tablet (81 mg total) by mouth daily. Swallow whole.    Medications Discontinued During This Encounter  Medication Reason   rosuvastatin (CRESTOR) 20 MG tablet    aspirin EC 81 MG tablet      Current Outpatient Medications:    aspirin EC 81 MG tablet, Take 1 tablet (81 mg total) by mouth daily. Swallow whole., Disp: , Rfl:    ibuprofen (ADVIL) 100 MG tablet, Take 100 mg by mouth every 6 (six) hours as needed for fever (PRN)., Disp: , Rfl:    metoprolol tartrate (LOPRESSOR) 25 MG tablet, Take 1 tablet (25 mg total) by mouth 2 (two) times daily. HOLD medication if systolic blood pressure (top number) is less than 100 OR if your heart rate is less than 55 beats per minute. Start taking 1 week before your cardiac CT scan. Once your scan  is complete, STOP taking this medication completely., Disp: 14 tablet, Rfl: 0   rosuvastatin (CRESTOR) 20 MG tablet, Take 1 tablet (20 mg total) by mouth at bedtime., Disp: 90 tablet, Rfl: 3  Current Facility-Administered Medications:    0.9 %  sodium chloride infusion, 500 mL, Intravenous, Continuous, Hilarie Fredrickson, MD  Consent:   Informed Consent   Shared Decision Making/Informed Consent The risks [stroke (1 in 1000), death (1 in 1000), kidney failure [usually temporary]  (1 in 500), bleeding (1 in 200), allergic reaction [possibly serious] (1 in 200)], benefits (diagnostic support and management of coronary artery disease) and alternatives of a cardiac catheterization were discussed in detail with Mr. Vodicka and he is willing to proceed.     Disposition:   Follow-up in 6 weeks, sooner if needed  His questions and concerns were addressed to his satisfaction. He voices understanding of the recommendations provided during this encounter.    Signed, Tessa Lerner, DO, [facility]  [facility]  [address] [place], [place] [address]

## 2023-09-19 NOTE — Progress Notes (Signed)
 Cardiology Office Note:    NAME:  [name] [name]    MRN: [id number] DOB:  11-21-1940   PCP:  Dois Davenport, MD  Former Cardiology Providers: Dr. Sharon Seller, Dr. Jens Som Primary Cardiologist:  Tessa Lerner, DO, [facility] (established care 08/01/2023) Electrophysiologist:  None   Chief Complaint  Patient presents with   Results    Reviewed the results of the coronary CTA   Follow-up    Dyspnea on exertion    History of Present Illness:    [name] [name] is a 83 y.o. 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   Patient was referred to practice for preoperative risk stratification prior to upcoming noncardiac surgery.  As part of preoperative risk stratification patient endorsed having shortness of breath with exertion.  Though he has several chronic comorbid conditions that could explain dyspnea but given the fact that the symptoms were more noticeable and quickly brought on raised the concern for possible underlying CAD given his age, untreated hyperlipidemia, and other cardiovascular risk factors.  In addition, he also had nonspecific ST-T changes.  Since last office visit he did undergo CCTA which notes severe CAC and CAD within the LAD and the RCA distribution.  Clinically denies anginal chest pain.  He continues to have dyspnea on exertion.  Patient is tentatively scheduled to have a left knee replacement in February 2024 with Dr. Antony Odea.    Patient also carries a history of paroxysmal atrial fibrillation and  underwent radiofrequency ablation approximately 16 years ago according to him.  Since then him and his provider had decided to discontinue AV nodal blocking agents as well as anticoagulation. Patient states that he has not had any reoccurrence of A-fib since the ablation to the best of his knowledge.  Current Medications: Current Meds  Medication Sig   [DISCONTINUED] aspirin EC 81 MG tablet Take 1 tablet (81 mg total) by mouth daily. Swallow whole.   [DISCONTINUED] rosuvastatin (CRESTOR) 20 MG tablet Take 1 tablet (20 mg total) by mouth daily.   Current Facility-Administered Medications for the 09/15/23 encounter (Office Visit) with Tessa Lerner, DO  Medication   0.9 %  sodium chloride infusion     Allergies:    Patient has no known allergies.   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    Colon polyps    hyperplastic and tubular adenomatous   Coronary atherosclerosis due to calcified coronary lesion    Diverticulosis    Dysrhythmia    hx at fib- ablation 8 yrs ago   Erectile dysfunction following radical prostatectomy    Hypercapnic respiratory failure, chronic (HCC) 05/01/2015   Mediastinal mass    per CT CHEST 11/07/14 @ [facility].[name] KitchenANTERIOR...4.2 X 3.3 cm soft tissue   Neuromuscular disorder (HCC)    Prostate cancer [facility])    prostate cancer   Radiation    for prostate cancer   Radiation proctitis    rectum   Recurrent  laryngeal nerve palsy    Respiratory failure (HCC)    s/p phrenic nerve palsy    Thrombocytopenia (HCC)    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     Past Surgical History: Past Surgical History:  Procedure Laterality Date   ATRIAL ABLATION SURGERY     for a fib x 5 years ago   BACK SURGERY     COLONOSCOPY      CYSTOSCOPY N/A 11/26/2014   Procedure: CYSTOSCOPY FLEXIBLE;  Surgeon: Alfredo Martinez, MD;  Location: [facility] OR;  Service: Urology;  Laterality: N/A;   CYSTOSCOPY WITH URETHRAL DILATATION N/A 11/26/2014   Procedure: CYSTOSCOPY WITH URETHRAL DILATATION WITH INSERTION OF FOLEY;  Surgeon: Alfredo Martinez, MD;  Location: [facility] OR;  Service: Urology;  Laterality: N/A;   FLEXIBLE SIGMOIDOSCOPY  02/10/2012   Procedure: FLEXIBLE SIGMOIDOSCOPY;  Surgeon: Hilarie Fredrickson, MD;  Location: [facility];  Service: Endoscopy;  Laterality: N/A;  need APC    KNEE ARTHROSCOPY Left    LEAD REMOVAL N/A 11/26/2014   Procedure: CRYO INTERCOSTAL NERVE BLOCK;  Surgeon: Loreli Slot, MD;  Location: [facility] OR;  Service: Thoracic;  Laterality: N/A;   POLYPECTOMY     PROSTATE SURGERY     RESECTION OF MEDIASTINAL MASS N/A 11/26/2014   Procedure: RESECTION OF ANTERIOR MEDIASTINAL MASS;  Surgeon: Loreli Slot, MD;  Location: [facility] OR;  Service: Thoracic;  Laterality: N/A;   ROBOT ASSISTED LAPAROSCOPIC RADICAL PROSTATECTOMY  2009   radiation Tx 2011   TONSILLECTOMY     VASECTOMY     VIDEO ASSISTED THORACOSCOPY Left 11/26/2014   Procedure: VIDEO ASSISTED THORACOSCOPY;  Surgeon: Loreli Slot, MD;  Location: [facility] OR;  Service: Thoracic;  Laterality: Left;    Social History: Social History   Tobacco Use   Smoking status: Never   Smokeless tobacco: Never  Vaping Use   Vaping status: Never Used  Substance Use Topics   Alcohol use: No    Alcohol/week: 0.0 standard drinks of alcohol   Drug use: No    Family History: Family History  Problem Relation Age of Onset   Prostate cancer Father    Heart disease Mother    Heart attack Mother    Healthy Sister    Healthy Sister    Colon cancer Neg Hx    Esophageal cancer Neg Hx    Rectal cancer Neg Hx    Stomach cancer Neg Hx    Pancreatic cancer Neg Hx     ROS:   Review of Systems  Cardiovascular:  Positive for dyspnea on exertion. Negative for chest pain,  claudication, irregular heartbeat, leg swelling, near-syncope, orthopnea, palpitations, paroxysmal nocturnal dyspnea and syncope.  Respiratory:  Negative for shortness of breath.   Hematologic/Lymphatic: Negative for bleeding problem.    EKGs/Labs/Other Studies Reviewed:   Echocardiogram December 2024: 09/11/2023  1. Left ventricular ejection fraction, by estimation, is 55 to 60%. The left ventricle has normal function. The left ventricle has no regional wall motion abnormalities. Left ventricular diastolic function could not be evaluated.  2. Right ventricular systolic function is normal. The right ventricular size is normal. There is normal pulmonary artery systolic pressure.  3. The mitral valve is normal in structure. No evidence of mitral valve regurgitation. No evidence of mitral stenosis.  4. The aortic valve is normal in structure. There is mild calcification of the aortic valve. Aortic valve regurgitation is not visualized. No aortic stenosis is present.  5. Aortic dilatation noted. There is dilatation of the  aortic root, measuring 41 mm. There is dilatation of the ascending aorta, measuring 39 mm.  6. The inferior vena cava is normal in size with greater than 50% respiratory variability, suggesting right atrial pressure of 3 mmHg.  Coronary CTA December 2024: 1. Coronary artery calcium score 3221 Agatston units. This suggests high risk for future cardiac events.   2. Moderate stenosis distal LAD, borderline hemodynamic significance by FFR.   3. Moderate stenosis distal RCA into bifurcation of PLV and PDA. Could not be assessed by FFR. Looks to be of borderline hemodynamic significance.  4.  Noncardiac findings: 1. Ill-defined areas of ground-glass within the left lower lobe, not seen on prior exam, may be infectious or inflammatory. This is not in a configuration typical of hypoventilatory change. 2. Elevated right hemidiaphragm with adjacent compressive atelectasis or scarring. 3.  The right middle lobe nodule on prior PET is not confidently seen on the current exam. 4. Unchanged size of 4 mm posterior mediastinal node that was tracer avid on prior PET.   5. Aortic Atherosclerosis (ICD10-I70.0).  CT FFR December 2024: 1. Moderate stenosis distal LAD, borderline hemodynamic significance by FFR.   3. Moderate stenosis distal RCA into bifurcation of PLV and PDA. Could not be assessed by FFR. Looks to be of borderline hemodynamic significance.  Click Here to Calculate/Change CHADS2VASc Score The patient's CHADS2-VASc score is 3, indicating a 3.2% annual risk of stroke.   CHF History: No HTN History: No Diabetes History: No Stroke History: No Vascular Disease History: Yes   Labs:    Latest Ref Rng & Units 09/15/2023    3:37 PM 11/28/2014    5:38 AM 11/27/2014    4:00 AM  CBC  WBC 3.4 - 10.8 x10E3/uL 6.3  11.3  10.6   Hemoglobin 13.0 - 17.7 g/dL 09.8  11.9  14.7   Hematocrit 37.5 - 51.0 % 40.6  38.0  37.5   Platelets 150 - 450 x10E3/uL 253  163  176        Latest Ref Rng & Units 09/15/2023    3:55 PM 09/15/2023    3:37 PM 11/28/2014    5:38 AM  BMP  Glucose 70 - 99 mg/dL 829  562  130   BUN 8 - 27 mg/dL 14  15  15    Creatinine 0.76 - 1.27 mg/dL 8.65  7.84  6.96   BUN/Creat Ratio 10 - 24 16  17     Sodium 134 - 144 mmol/L 140  141  135   Potassium 3.5 - 5.2 mmol/L 4.7  4.7  3.8   Chloride 96 - 106 mmol/L 98  99  100   CO2 20 - 29 mmol/L 24  25  29    Calcium 8.6 - 10.2 mg/dL 9.7  9.6  8.5       Latest Ref Rng & Units 09/15/2023    3:55 PM 09/15/2023    3:37 PM 11/28/2014    5:38 AM  CMP  Glucose 70 - 99 mg/dL 295  284  132   BUN 8 - 27 mg/dL 14  15  15    Creatinine 0.76 - 1.27 mg/dL 4.40  1.02  7.25   Sodium 134 - 144 mmol/L 140  141  135   Potassium 3.5 - 5.2 mmol/L 4.7  4.7  3.8   Chloride 96 - 106 mmol/L 98  99  100   CO2 20 - 29 mmol/L 24  25  29    Calcium 8.6 - 10.2 mg/dL 9.7  9.6  8.5   Total Protein 6.0 - 8.5 g/dL 7.1   5.8   Total Bilirubin 0.0 -  1.2 mg/dL 0.8   1.5   Alkaline Phos 44 - 121 IU/L 101   57   AST 0 - 40 IU/L 18   20   ALT 0 - 44 IU/L 18   15     Lab Results  Component Value Date   CHOL 230 (H) 09/15/2023   HDL 67 09/15/2023   LDLCALC 146 (H) 09/15/2023   LDLDIRECT 145 (H) 09/15/2023   TRIG 96 09/15/2023   CHOLHDL 3.4 09/15/2023   No results for input(s): "LIPOA" in the last 8760 hours. No components found for: "NTPROBNP" No results for input(s): "PROBNP" in the last 8760 hours. No results for input(s): "TSH" in the last 8760 hours.  Physical Exam:    Today's Vitals   09/15/23 1424  BP: 120/78  Pulse: 74  Resp: 16  SpO2: 96%  Weight: 190 lb (86.2 kg)  Height: 5\' 10"  (1.778 m)   Body mass index is 27.26 kg/m. Wt Readings from Last 3 Encounters:  09/15/23 190 lb (86.2 kg)  08/01/23 184 lb (83.5 kg)  05/02/23 185 lb (83.9 kg)    Physical Exam  Constitutional: No distress.  hemodynamically stable  Neck: No JVD present.  Cardiovascular: Normal rate, regular rhythm, S1 normal and S2 normal. Exam reveals no gallop, no S3 and no S4.  No murmur heard. Pulmonary/Chest: Effort normal and breath sounds normal. No stridor. He has no wheezes. He has no rales.  Abdominal: Soft. Bowel sounds are normal. He exhibits no distension. There is no abdominal tenderness.  Musculoskeletal:        General: No edema.     Cervical back: Neck supple.  Neurological: He is alert and oriented to person, place, and time. He has intact cranial nerves (2-12).  Skin: Skin is warm.     Impression & Recommendation(s):  Impression:   ICD-10-CM   1. Coronary atherosclerosis due to calcified coronary lesion  I25.10 Basic metabolic panel   W10.27 CBC    CBC    Basic metabolic panel    rosuvastatin (CRESTOR) 20 MG tablet    aspirin EC 81 MG tablet    Lipid panel    LDL cholesterol, direct    Comprehensive metabolic panel    Comprehensive metabolic panel    LDL cholesterol, direct    Lipid panel    DISCONTINUED: aspirin  EC 81 MG tablet    DISCONTINUED: rosuvastatin (CRESTOR) 20 MG tablet    2. Agatston CAC score, >400  R93.1     3. Dyspnea on exertion  R06.09 Basic metabolic panel    CBC    CBC    Basic metabolic panel    4. Pure hypercholesterolemia  E78.00     5. Preoperative cardiovascular examination  Z01.810 Basic metabolic panel    CBC    CBC    Basic metabolic panel    6. Paroxysmal atrial fibrillation (HCC)  I48.0     7. S/P ablation of atrial fibrillation  Z98.890    Z86.79     8. Aortic root dilatation (HCC)  I77.810        Recommendation(s):  Coronary atherosclerosis due to calcified coronary lesion Agatston CAC score, >400 Dyspnea on exertion Presented to the office for preoperative risk stratification prior to upcoming surgery.  Clinically experiencing progressive dyspnea.  Subtle EKG changes.  And multiple cardiovascular risk factors.  Shared decision was  to proceed with coronary CTA to evaluate for CAC/CAD. Total CAC is 3221 -consistent with severe CAC.  He at least has moderate CAD based on coronary CTA in the LAD and RCA distribution. I independently reviewed the coronary CTA results and also reviewed the images with the patient.  He has diffuse LAD disease with predominant calcified disease in the distal one third of the vessel. CTFFR values are within the intermediate zone at the distal LAD and I suspected secondary to vessel tapering as opposed to true obstructive disease.  However, when evaluating the RCA disease he has bifurcating disease as a distal RCA bifurcates into the PLV and PDA.  CT FFR was not able to evaluate/model disease in the PLV/PDA distribution.  Given the fact that he has symptoms of dyspnea which could be his coronary equivalent and upcoming elective noncardiac surgery having a clear understanding of his coronary anatomy is important prior to subjecting him to anesthesia. I have explained to [name] my concerns in details. To better evaluate for the  severity of CAD recommended coronary angiography with possible intervention.  Risks, benefits, and alternatives were discussed with the patient during today's office visit.  Patient wanted to discuss with his close friend who is a former cardiologist at heart care prior to making a decision.  Prior to completing this office note he did reach out back to [place] stating that he would like to proceed with left heart catheterization with possible intervention.  Echo notes preserved LVEF, without any significant valvular heart disease, but dilatation of the aortic root and proximal ascending aorta.  This was not commented on during his recent coronary CTA.  Will follow disease progression with annual echocardiogram and if significant change in dimensions will confirm with CT chest.  Start aspirin 81 mg p.o. daily. Start Crestor 20 mg p.o. nightly, follow-up fasting lipids in 6 weeks to reevaluate therapy and CMP.  We also discussed using sublingual nitroglycerin as needed-but he would like to hold off on it.  Educated him on seeking medical attention sooner by going to the closest ER via EMS if the symptoms increase in intensity, frequency, duration, or has typical chest pain as discussed in the office.  Patient verbalized understanding.  Pure hypercholesterolemia Direct LDL 145 mg/dL as of January 2130. Given the severe CAC had a prolonged discussion with the patient the role of lipid-lowering agents.  Patient states that he has heard a lot about statins and its side effects and has chose not to be on this particular drug class in the past.  However given the severity of coronary artery disease I have strongly encouraged him to reconsider.  After discussing the risks, benefits, alternatives patient is willing to start statin therapy. If he is truly intolerant to statin therapy other options that we have discussed are nonstatin therapies such as Zetia, Nexlizet, Nexletol +/- PCSK9 inhibitors.  Preoperative  cardiovascular examination Tentatively scheduled to have a left knee replacement in February 2024 with Dr. Antony Odea.  Further recommendations to follow  Paroxysmal atrial fibrillation (HCC) S/P ablation of atrial fibrillation Patient states that he has had an atrial fibrillation ablation approximately 16 years ago and since then has been in normal sinus rhythm.   He is not on AV nodal blocking agents OR thromboembolic prophylaxis as his CHA2DS2-VASc score was 2.  However, given his CCTA he does have Aortic atherosclerosis and his CHA2DS2-VASc score is now 3.  We discuss the role of anticoagulation given his risk score - however, now he  is agreeable with ASA 81mg  po day.  We will discuss this further at later visit. An alternative to longterm OAC could be ASA + implantable loop recorder to monitor Afib burden (if present it would give the patient more objective data to make an more informed decision).  Given the fact that he is not on [facility] he verbalizes understanding that he is at risk of stoke; therefore, if he has stroke like symptoms he should go to the nearest ER via EMS for evaluation.   Of note, I reviewed the radiology over read from the recent coronary CTA.  I have asked him to follow-up with PCP to see if additional testing and/or monitoring is required.  Patient verbalized understanding.  Orders Placed:  Orders Placed This Encounter  Procedures   Basic metabolic panel    Standing Status:   Future    Number of Occurrences:   1    Expected Date:   09/15/2023    Expiration Date:   09/14/2024   CBC    Standing Status:   Future    Number of Occurrences:   1    Expected Date:   09/15/2023    Expiration Date:   09/14/2024   Lipid panel    Standing Status:   Future    Number of Occurrences:   1    Expected Date:   10/27/2023    Expiration Date:   09/14/2024   LDL cholesterol, direct    Standing Status:   Future    Number of Occurrences:   1    Expected Date:   10/27/2023    Expiration Date:    09/14/2024   Comprehensive metabolic panel    Standing Status:   Future    Number of Occurrences:   1    Expected Date:   10/27/2023    Expiration Date:   09/14/2024    Final Medication List:    Meds ordered this encounter  Medications   DISCONTD: aspirin EC 81 MG tablet    Sig: Take 1 tablet (81 mg total) by mouth daily. Swallow whole.    Dispense:  30 tablet    Refill:  12   DISCONTD: rosuvastatin (CRESTOR) 20 MG tablet    Sig: Take 1 tablet (20 mg total) by mouth daily.    Dispense:  90 tablet    Refill:  3   rosuvastatin (CRESTOR) 20 MG tablet    Sig: Take 1 tablet (20 mg total) by mouth at bedtime.    Dispense:  90 tablet    Refill:  3   aspirin EC 81 MG tablet    Sig: Take 1 tablet (81 mg total) by mouth daily. Swallow whole.    Medications Discontinued During This Encounter  Medication Reason   rosuvastatin (CRESTOR) 20 MG tablet    aspirin EC 81 MG tablet      Current Outpatient Medications:    aspirin EC 81 MG tablet, Take 1 tablet (81 mg total) by mouth daily. Swallow whole., Disp: , Rfl:    ibuprofen (ADVIL) 100 MG tablet, Take 100 mg by mouth every 6 (six) hours as needed for fever (PRN)., Disp: , Rfl:    metoprolol tartrate (LOPRESSOR) 25 MG tablet, Take 1 tablet (25 mg total) by mouth 2 (two) times daily. HOLD medication if systolic blood pressure (top number) is less than 100 OR if your heart rate is less than 55 beats per minute. Start taking 1 week before your cardiac CT scan. Once your scan  is complete, STOP taking this medication completely., Disp: 14 tablet, Rfl: 0   rosuvastatin (CRESTOR) 20 MG tablet, Take 1 tablet (20 mg total) by mouth at bedtime., Disp: 90 tablet, Rfl: 3  Current Facility-Administered Medications:    0.9 %  sodium chloride infusion, 500 mL, Intravenous, Continuous, Hilarie Fredrickson, MD  Consent:   Informed Consent   Shared Decision Making/Informed Consent The risks [stroke (1 in 1000), death (1 in 1000), kidney failure [usually temporary]  (1 in 500), bleeding (1 in 200), allergic reaction [possibly serious] (1 in 200)], benefits (diagnostic support and management of coronary artery disease) and alternatives of a cardiac catheterization were discussed in detail with Mr. Vodicka and he is willing to proceed.     Disposition:   Follow-up in 6 weeks, sooner if needed  His questions and concerns were addressed to his satisfaction. He voices understanding of the recommendations provided during this encounter.    Signed, Tessa Lerner, DO, [facility]  [facility]  [address] [place], [place] [address]

## 2023-09-19 NOTE — Telephone Encounter (Signed)
 Pt calling to f/u on date and time of Cath. He would like a c/b regarding this matter. Please advise

## 2023-09-19 NOTE — Telephone Encounter (Signed)
 Spoke with pt over the phone and let him know that his [facility] has been scheduled for 09/29/23 at 7:30 AM (needs to be there at 5:30 AM) with Dr. Jordan. Explained to pt that a copy of the cath instructions will be sent via MyChart as well as a physical copy sent to the address we have on file. Pt verbalized understanding and had no further questions at this time.

## 2023-09-21 ENCOUNTER — Other Ambulatory Visit: Payer: Self-pay

## 2023-09-21 DIAGNOSIS — R931 Abnormal findings on diagnostic imaging of heart and coronary circulation: Secondary | ICD-10-CM

## 2023-09-21 NOTE — Progress Notes (Signed)
 Order for referral to PharmD for lipids placed per Dr. Odis Hollingshead.

## 2023-09-27 ENCOUNTER — Telehealth: Payer: Self-pay | Admitting: *Deleted

## 2023-09-27 NOTE — Telephone Encounter (Signed)
 Cardiac Catheterization scheduled at [facility] for: Friday September 29, 2023 7:30 AM Arrival time [facility] Main Entrance A at: 5:30 AM  Nothing to eat after midnight prior to procedure, clear liquids until 5 AM day of procedure.  Medication instructions: -Usual morning medications can be taken with sips of water including aspirin  81 mg.  Plan to go home the same day, you will only stay overnight if medically necessary.  You must have responsible adult to drive you home.  Someone must be with you the first 24 hours after you arrive home.  Reviewed procedure instructions with patient.

## 2023-09-29 ENCOUNTER — Encounter (HOSPITAL_COMMUNITY)
Admission: RE | Disposition: A | Discharge status: Home / Self Care | Payer: Self-pay | Source: Home / Self Care | Attending: Cardiology

## 2023-09-29 ENCOUNTER — Ambulatory Visit (HOSPITAL_COMMUNITY)
Admission: RE | Admit: 2023-09-29 | Discharge: 2023-09-29 | Disposition: A | Discharge status: Home / Self Care | Payer: PPO | Attending: Cardiology | Admitting: Cardiology

## 2023-09-29 ENCOUNTER — Other Ambulatory Visit: Payer: Self-pay

## 2023-09-29 DIAGNOSIS — I480 Paroxysmal atrial fibrillation: Secondary | ICD-10-CM | POA: Diagnosis not present

## 2023-09-29 DIAGNOSIS — I77810 Thoracic aortic ectasia: Secondary | ICD-10-CM | POA: Diagnosis not present

## 2023-09-29 DIAGNOSIS — Z7982 Long term (current) use of aspirin: Secondary | ICD-10-CM | POA: Insufficient documentation

## 2023-09-29 DIAGNOSIS — I2510 Atherosclerotic heart disease of native coronary artery without angina pectoris: Secondary | ICD-10-CM | POA: Insufficient documentation

## 2023-09-29 DIAGNOSIS — R0609 Other forms of dyspnea: Secondary | ICD-10-CM | POA: Diagnosis present

## 2023-09-29 DIAGNOSIS — I2584 Coronary atherosclerosis due to calcified coronary lesion: Secondary | ICD-10-CM | POA: Diagnosis not present

## 2023-09-29 DIAGNOSIS — E7800 Pure hypercholesterolemia, unspecified: Secondary | ICD-10-CM | POA: Insufficient documentation

## 2023-09-29 DIAGNOSIS — Z8546 Personal history of malignant neoplasm of prostate: Secondary | ICD-10-CM | POA: Insufficient documentation

## 2023-09-29 DIAGNOSIS — Z79899 Other long term (current) drug therapy: Secondary | ICD-10-CM | POA: Diagnosis not present

## 2023-09-29 HISTORY — PX: LEFT HEART CATH AND CORONARY ANGIOGRAPHY: CATH118249

## 2023-09-29 SURGERY — LEFT HEART CATH AND CORONARY ANGIOGRAPHY
Anesthesia: LOCAL

## 2023-09-29 MED ORDER — LIDOCAINE HCL (PF) 1 % IJ SOLN
INTRAMUSCULAR | Status: DC | PRN
  Administered 2023-09-29: 2 mL

## 2023-09-29 MED ORDER — SODIUM CHLORIDE 0.9 % IV SOLN
250.0000 mL | INTRAVENOUS | Status: DC | PRN
Start: 2023-09-29 — End: 2023-09-29

## 2023-09-29 MED ORDER — SODIUM CHLORIDE 0.9% FLUSH: 3.0000 mL | INTRAVENOUS | Status: DC | PRN

## 2023-09-29 MED ORDER — HEPARIN SODIUM (PORCINE) 1000 UNIT/ML IJ SOLN
INTRAMUSCULAR | Status: DC | PRN
  Administered 2023-09-29: 4000 [IU] via INTRAVENOUS

## 2023-09-29 MED ORDER — VERAPAMIL HCL 2.5 MG/ML IV SOLN
INTRAVENOUS | Status: AC
  Filled 2023-09-29: qty 2

## 2023-09-29 MED ORDER — FENTANYL CITRATE (PF) 100 MCG/2ML IJ SOLN
INTRAMUSCULAR | Status: AC
  Filled 2023-09-29: qty 2

## 2023-09-29 MED ORDER — HEPARIN SODIUM (PORCINE) 1000 UNIT/ML IJ SOLN
INTRAMUSCULAR | Status: AC
  Filled 2023-09-29: qty 10

## 2023-09-29 MED ORDER — SODIUM CHLORIDE 0.9 % WEIGHT BASED INFUSION: 3.0000 mL/kg/h | INTRAVENOUS | Status: AC

## 2023-09-29 MED ORDER — MIDAZOLAM HCL 2 MG/2ML IJ SOLN
INTRAMUSCULAR | Status: AC
  Filled 2023-09-29: qty 2

## 2023-09-29 MED ORDER — SODIUM CHLORIDE 0.9 % WEIGHT BASED INFUSION: 1.0000 mL/kg/h | INTRAVENOUS | Status: DC

## 2023-09-29 MED ORDER — SODIUM CHLORIDE 0.9% FLUSH: 3.0000 mL | Freq: Two times a day (BID) | INTRAVENOUS | Status: DC

## 2023-09-29 MED ORDER — IOHEXOL 350 MG/ML SOLN
INTRAVENOUS | Status: DC | PRN
  Administered 2023-09-29: 43 mL

## 2023-09-29 MED ORDER — LIDOCAINE HCL (PF) 1 % IJ SOLN
INTRAMUSCULAR | Status: AC
  Filled 2023-09-29: qty 30

## 2023-09-29 MED ORDER — ASPIRIN 81 MG PO CHEW: 81.0000 mg | CHEWABLE_TABLET | ORAL | Status: AC

## 2023-09-29 MED ORDER — MIDAZOLAM HCL 2 MG/2ML IJ SOLN
INTRAMUSCULAR | Status: DC | PRN
  Administered 2023-09-29: 1 mg via INTRAVENOUS

## 2023-09-29 MED ORDER — ACETAMINOPHEN 325 MG PO TABS: 650.0000 mg | ORAL_TABLET | ORAL | Status: DC | PRN

## 2023-09-29 MED ORDER — ONDANSETRON HCL 4 MG/2ML IJ SOLN: 4.0000 mg | Freq: Four times a day (QID) | INTRAMUSCULAR | Status: DC | PRN

## 2023-09-29 MED ORDER — FENTANYL CITRATE (PF) 100 MCG/2ML IJ SOLN
INTRAMUSCULAR | Status: DC | PRN
  Administered 2023-09-29: 25 ug via INTRAVENOUS

## 2023-09-29 MED ORDER — VERAPAMIL HCL 2.5 MG/ML IV SOLN
INTRAVENOUS | Status: DC | PRN
  Administered 2023-09-29: 10 mL via INTRA_ARTERIAL

## 2023-09-29 MED ORDER — HEPARIN (PORCINE) IN NACL 1000-0.9 UT/500ML-% IV SOLN
INTRAVENOUS | Status: DC | PRN
  Administered 2023-09-29 (×2): 500 mL

## 2023-09-29 SURGICAL SUPPLY — 10 items
CATH 5FR JL3.5 JR4 ANG PIG MP (CATHETERS) IMPLANT
DEVICE RAD COMP TR BAND LRG (VASCULAR PRODUCTS) IMPLANT
GLIDESHEATH SLEND SS 6F .021 (SHEATH) IMPLANT
GUIDEWIRE INQWIRE 1.5J.035X260 (WIRE) IMPLANT
INQWIRE 1.5J .035X260CM (WIRE) ×1 IMPLANT
KIT SINGLE USE MANIFOLD (KITS) IMPLANT
KIT SYRINGE INJ CVI SPIKEX1 (MISCELLANEOUS) IMPLANT
PACK CARDIAC CATHETERIZATION (CUSTOM PROCEDURE TRAY) ×1 IMPLANT
SET ATX-X65L (MISCELLANEOUS) IMPLANT
SHEATH PROBE COVER 6X72 (BAG) IMPLANT

## 2023-09-29 NOTE — Discharge Instructions (Signed)
Radial Site Care  This sheet gives you information about how to care for yourself after your procedure. Your health care provider may also give you more specific instructions. If you have problems or questions, contact your health care provider. What can I expect after the procedure? After the procedure, it is common to have: Bruising and tenderness at the catheter insertion area. Follow these instructions at home: Medicines Take over-the-counter and prescription medicines only as told by your health care provider. Insertion site care Follow instructions from your health care provider about how to take care of your insertion site. Make sure you: Wash your hands with soap and water before you remove your bandage (dressing). If soap and water are not available, use hand sanitizer. May remove dressing in 24 hours. Check your insertion site every day for signs of infection. Check for: Redness, swelling, or pain. Fluid or blood. Pus or a bad smell. Warmth. Do no take baths, swim, or use a hot tub for 5 days. You may shower 24-48 hours after the procedure. Remove the dressing and gently wash the site with plain soap and water. Pat the area dry with a clean towel. Do not rub the site. That could cause bleeding. Do not apply powder or lotion to the site. Activity  For 24 hours after the procedure, or as directed by your health care provider: Do not flex or bend the affected arm. Do not push or pull heavy objects with the affected arm. Do not drive yourself home from the hospital or clinic. You may drive 24 hours after the procedure. Do not operate machinery or power tools. KEEP ARM ELEVATED THE REMAINDER OF THE DAY. Do not push, pull or lift anything that is heavier than 10 lb for 5 days. Ask your health care provider when it is okay to: Return to work or school. Resume usual physical activities or sports. Resume sexual activity. General instructions If the catheter site starts to  bleed, raise your arm and put firm pressure on the site. If the bleeding does not stop, get help right away. This is a medical emergency. DRINK PLENTY OF FLUIDS FOR THE NEXT 2-3 DAYS. No alcohol consumption for 24 hours after receiving sedation. If you went home on the same day as your procedure, a responsible adult should be with you for the first 24 hours after you arrive home. Keep all follow-up visits as told by your health care provider. This is important. Contact a health care provider if: You have a fever. You have redness, swelling, or yellow drainage around your insertion site. Get help right away if: You have unusual pain at the radial site. The catheter insertion area swells very fast. The insertion area is bleeding, and the bleeding does not stop when you hold steady pressure on the area. Your arm or hand becomes pale, cool, tingly, or numb. These symptoms may represent a serious problem that is an emergency. Do not wait to see if the symptoms will go away. Get medical help right away. Call your local emergency services (911 in the [place] Do not drive yourself to the hospital. Summary After the procedure, it is common to have bruising and tenderness at the site. Follow instructions from your health care provider about how to take care of your radial site wound. Check the wound every day for signs of infection.  This information is not intended to replace advice given to you by your health care provider. Make sure you discuss any questions you have with   your health care provider. Document Revised: 10/04/2017 Document Reviewed: 10/04/2017 Elsevier Patient Education  2020 Elsevier Inc.  

## 2023-09-29 NOTE — Interval H&P Note (Signed)
History and Physical Interval Note:  09/29/2023 7:20 AM  [name] [name]  has presented today for surgery, with the diagnosis of cad.  The various methods of treatment have been discussed with the patient and family. After consideration of risks, benefits and other options for treatment, the patient has consented to  Procedure(s): LEFT HEART CATH AND CORONARY ANGIOGRAPHY (N/A) as a surgical intervention.  The patient's history has been reviewed, patient examined, no change in status, stable for surgery.  I have reviewed the patient's chart and labs.  Questions were answered to the patient's satisfaction.   Cath Lab Visit (complete for each Cath Lab visit)  Clinical Evaluation Leading to the Procedure:   ACS: No.  Non-ACS:    Anginal Classification: CCS II  Anti-ischemic medical therapy: No Therapy  Non-Invasive Test Results: Low-risk stress test findings: cardiac mortality <1%/year  Prior CABG: No previous CABG        Theron Arista [facility] 09/29/2023 7:20 AM

## 2023-10-02 ENCOUNTER — Encounter (HOSPITAL_COMMUNITY): Payer: Self-pay | Admitting: Cardiology

## 2023-10-03 NOTE — H&P (Signed)
TOTAL KNEE ADMISSION H&P  Patient is being admitted for left total knee arthroplasty.  Subjective:  Chief Complaint: Left knee pain.  HPI: [name] [name], 83 y.o. male has a history of pain and functional disability in the left knee due to arthritis and has failed non-surgical conservative treatments for greater than 12 weeks to include NSAID's and/or analgesics and activity modification. Onset of symptoms was gradual, starting >10 years ago with gradually worsening course since that time. The patient noted no past surgery on the left knee.  Patient currently rates pain in the left knee at 8 out of 10 with activity. Patient has night pain, worsening of pain with activity and weight bearing, pain with passive range of motion, and crepitus. Patient has evidence of  severe tricompartmental bone-on-bone changes in the left knee with some erosion on the medial side  by imaging studies. There is no active infection.  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   Vocal cord paralysis 10/07/2015   Hypercapnic respiratory failure, chronic (HCC) 05/01/2015   Chronic respiratory failure with hypercapnia (HCC) 05/01/2015   Thymoma 04/16/2015   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   Erectile dysfunction following radical prostatectomy    Myofascial pain 08/12/2014   Lumbar spondylosis with myelopathy 08/11/2014   Spinal stenosis 07/29/2014   Degeneration of lumbar intervertebral disc 07/16/2014   Lumbar radiculopathy 07/16/2014    Prostate cancer (HCC) 08/13/2012   Atrial fibrillation (HCC) 08/13/2012   Right elbow pain 07/05/2012    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    Colon polyps    hyperplastic and tubular adenomatous   Coronary atherosclerosis due to calcified coronary lesion    Diverticulosis    Dysrhythmia    hx at fib- ablation 8 yrs ago   Erectile dysfunction following radical prostatectomy    Hypercapnic respiratory failure, chronic (HCC) 05/01/2015   Mediastinal mass    per CT CHEST 11/07/14 @ [facility].[name] KitchenANTERIOR...4.2 X 3.3 cm soft tissue   Neuromuscular disorder (HCC)    Prostate cancer [facility])    prostate cancer   Radiation    for prostate cancer   Radiation proctitis    rectum   Recurrent laryngeal nerve palsy    Respiratory failure (HCC)    s/p phrenic nerve palsy    Thrombocytopenia (HCC)    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     Past Surgical History:  Procedure Laterality Date   ATRIAL ABLATION SURGERY     for a fib x 5 years ago   BACK SURGERY     COLONOSCOPY     CYSTOSCOPY N/A 11/26/2014   Procedure: CYSTOSCOPY FLEXIBLE;  Surgeon: Alfredo Martinez, MD;  Location: [facility] OR;  Service: Urology;  Laterality: N/A;   CYSTOSCOPY WITH URETHRAL DILATATION N/A 11/26/2014   Procedure: CYSTOSCOPY WITH URETHRAL DILATATION WITH INSERTION OF FOLEY;  Surgeon:  Alfredo Martinez, MD;  Location[facility] OR;  Service: Urology;  Laterality: N/A;   FLEXIBLE SIGMOIDOSCOPY  02/10/2012   Procedure: FLEXIBLE SIGMOIDOSCOPY;  Surgeon: Hilarie Fredrickson, MD;  Location: [facility];  Service: Endoscopy;  Laterality: N/A;  need APC    KNEE ARTHROSCOPY Left    LEAD REMOVAL N/A 11/26/2014   Procedure: CRYO INTERCOSTAL NERVE BLOCK;  Surgeon: Loreli Slot, MD;  Location: [facility] OR;  Service:  Thoracic;  Laterality: N/A;   LEFT HEART CATH AND CORONARY ANGIOGRAPHY N/A 09/29/2023   Procedure: LEFT HEART CATH AND CORONARY ANGIOGRAPHY;  Surgeon: Swaziland, Peter M, MD;  Location: [facility] INVASIVE CV LAB;  Service: Cardiovascular;  Laterality: N/A;   POLYPECTOMY     PROSTATE SURGERY     RESECTION OF MEDIASTINAL MASS N/A 11/26/2014   Procedure: RESECTION OF ANTERIOR MEDIASTINAL MASS;  Surgeon: Loreli Slot, MD;  Location: [facility] OR;  Service: Thoracic;  Laterality: N/A;   ROBOT ASSISTED LAPAROSCOPIC RADICAL PROSTATECTOMY  2009   radiation Tx 2011   TONSILLECTOMY     VASECTOMY     VIDEO ASSISTED THORACOSCOPY Left 11/26/2014   Procedure: VIDEO ASSISTED THORACOSCOPY;  Surgeon: Loreli Slot, MD;  Location: [facility] OR;  Service: Thoracic;  Laterality: Left;    Prior to Admission medications   Medication Sig Start Date End Date Taking? Authorizing Provider  aspirin EC 81 MG tablet Take 1 tablet (81 mg total) by mouth daily. Swallow whole. 09/15/23   Tolia, Sunit, DO  rosuvastatin (CRESTOR) 20 MG tablet Take 1 tablet (20 mg total) by mouth at bedtime. 09/15/23   Tolia, Sunit, DO    No Known Allergies  Social History   Socioeconomic History   Marital status: Married    Spouse name: Not on file   Number of children: 4   Years of education: Not on file   Highest education level: Not on file  Occupational History   Occupation: orhodontist  Tobacco Use   Smoking status: Never   Smokeless tobacco: Never  Vaping Use   Vaping status: Never Used  Substance and Sexual Activity   Alcohol use: No    Alcohol/week: 0.0 standard drinks of alcohol   Drug use: No   Sexual activity: Never  Other Topics Concern   Not on file  Social History Narrative   Not on file   Social Drivers of Health   Financial Resource Strain: Not on file  Food Insecurity: Not on file  Transportation Needs: Not on file  Physical Activity: Not on file  Stress: Not on file  Social Connections: Not on file  Intimate  Partner Violence: Not on file    Tobacco Use: Low Risk  (09/19/2023)   Patient History    Smoking Tobacco Use: Never    Smokeless Tobacco Use: Never    Passive Exposure: Not on file   Social History   Substance and Sexual Activity  Alcohol Use No   Alcohol/week: 0.0 standard drinks of alcohol    Family History  Problem Relation Age of Onset   Prostate cancer Father    Heart disease Mother    Heart attack Mother    Healthy Sister    Healthy Sister    Colon cancer Neg Hx    Esophageal cancer Neg Hx    Rectal cancer Neg Hx    Stomach cancer Neg Hx    Pancreatic cancer Neg Hx     Review of Systems  Constitutional:  Negative for chills and fever.  HENT:  Negative  for congestion, sore throat and tinnitus.   Eyes:  Negative for double vision, photophobia and pain.  Respiratory:  Negative for cough, shortness of breath and wheezing.   Cardiovascular:  Negative for chest pain, palpitations and orthopnea.  Gastrointestinal:  Negative for heartburn, nausea and vomiting.  Genitourinary:  Negative for dysuria, frequency and urgency.  Musculoskeletal:  Positive for joint pain.  Neurological:  Negative for dizziness, weakness and headaches.    Objective:  Physical Exam: Well nourished and well developed.  General: Alert and oriented x3, cooperative and pleasant, no acute distress.  Head: normocephalic, atraumatic, neck supple.  Eyes: EOMI.  Musculoskeletal:  Left knee: No effusion, range of motion 0-130 degrees, marked crepitus on range of motion, tenderness medially, no lateral tenderness or instability.  Calves soft and nontender. Motor function intact in LE. Strength 5/5 LE bilaterally. Neuro: Distal pulses 2+. Sensation to light touch intact in LE.   Imaging Review Plain radiographs demonstrate severe degenerative joint disease of the left knee. The overall alignment is neutral. The bone quality appears to be adequate for age and reported activity  level.  Assessment/Plan:  End stage arthritis, left knee   The patient history, physical examination, clinical judgment of the provider and imaging studies are consistent with end stage degenerative joint disease of the left knee and total knee arthroplasty is deemed medically necessary. The treatment options including medical management, injection therapy arthroscopy and arthroplasty were discussed at length. The risks and benefits of total knee arthroplasty were presented and reviewed. The risks due to aseptic loosening, infection, stiffness, patella tracking problems, thromboembolic complications and other imponderables were discussed. The patient acknowledged the explanation, agreed to proceed with the plan and consent was signed. Patient is being admitted for inpatient treatment for surgery, pain control, PT, OT, prophylactic antibiotics, VTE prophylaxis, progressive ambulation and ADLs and discharge planning. The patient is planning to be discharged  home .   Patient's anticipated LOS is less than 2 midnights, meeting these requirements: - Lives within 1 hour of care - Has a competent adult at home to recover with post-op recover - NO history of  - Chronic pain requiring opiods  - Diabetes  - Heart failure  - Stroke  - DVT/VTE  - Respiratory Failure/COPD  - Renal failure  - Anemia  - Advanced Liver disease  Therapy Plans: Outpatient therapy at [facility] Disposition: Home with wife Planned DVT Prophylaxis: Xarelto 10 mg QD DME Needed: Dan Humphreys PCP: Nadyne Coombes, MD  Cardiologist: Peter Swaziland, MD (underwent heart cath on 1/17 - clearance received) TXA: IV Allergies: NKDA Metal Allergy: None Anesthesia Concerns: None BMI: 28.1 Last HgbA1c: Not diabetic  Pain Regimen: Oxycodone, tramadol Pharmacy: [facility] (have brought to room)  Other: - PMH: CAD, paroxysmal afib (s/p ablation 16 years ago), prostate CA - Calling Dr. Wendee Copp office tomorrow regarding clearance. Last seen a  few months ago for yearly.  - Patient was instructed on what medications to stop prior to surgery. - Follow-up visit in 2 weeks with Dr. Lequita Halt - Begin physical therapy following surgery - Pre-operative lab work as pre-surgical testing - Prescriptions will be provided in hospital at time of discharge  Arther Abbott, PA-C Orthopedic Surgery [facility] [place]

## 2023-10-05 ENCOUNTER — Encounter: Payer: Self-pay | Admitting: Pharmacist

## 2023-10-05 ENCOUNTER — Ambulatory Visit: Payer: PPO | Attending: Internal Medicine | Admitting: Pharmacist

## 2023-10-05 DIAGNOSIS — E7800 Pure hypercholesterolemia, unspecified: Secondary | ICD-10-CM | POA: Diagnosis not present

## 2023-10-05 DIAGNOSIS — R931 Abnormal findings on diagnostic imaging of heart and coronary circulation: Secondary | ICD-10-CM | POA: Diagnosis not present

## 2023-10-05 DIAGNOSIS — I2584 Coronary atherosclerosis due to calcified coronary lesion: Secondary | ICD-10-CM

## 2023-10-05 DIAGNOSIS — I2510 Atherosclerotic heart disease of native coronary artery without angina pectoris: Secondary | ICD-10-CM

## 2023-10-05 DIAGNOSIS — I700 Atherosclerosis of aorta: Secondary | ICD-10-CM

## 2023-10-05 NOTE — Progress Notes (Signed)
Patient ID: [name] [name]                 DOB: 1941-06-03                    MRN: [id number]     HPI: [name] [name] is a 83 y.o. male patient referred to lipid clinic by Dr Odis Hollingshead. PMH is significant for A fib, aortic atherosclerosis, elevated coronary calcium score, and prostate cancer. Started on rosuvastatin earlier this month.  Patient presents today in good spirits. Has been historically resistant to starting statins. Is tolerating well. Reports occasional muscle pain, unclear if this is from rosuvastatin or not.  He and his wife have been working on improving their diet. Eating Mediterranean diet now however he does eat ice cream. Denies alcohol use.  Coronary calcium score extremely elevated. He is wondering it is correct.  Current Medications:  Rosuvastatin 20mg   Intolerances: N/A  Risk Factors:  CAD Coronary calcium Aortic atherosclerosis  LDL goal: <55  Labs: TC 230, Trigs 96, HDL 67, LDL 146, Direct LDL 145 (09/16/23)  Imaging: IMPRESSION: 1. Coronary artery calcium score 3221 Agatston units. This suggests high risk for future cardiac events.   2. Moderate stenosis distal LAD, borderline hemodynamic significance by FFR.   3. Moderate stenosis distal RCA into bifurcation of PLV and PDA. Could not be assessed by FFR. Looks to be of borderline hemodynamic significance.  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    Colon polyps    hyperplastic and tubular adenomatous   Coronary atherosclerosis due to calcified coronary lesion    Diverticulosis    Dysrhythmia    hx at fib- ablation 8 yrs ago   Erectile dysfunction following radical prostatectomy    Hypercapnic respiratory failure, chronic (HCC) 05/01/2015   Mediastinal mass    per CT CHEST 11/07/14 @ [facility].[name] KitchenANTERIOR...4.2 X 3.3 cm soft tissue   Neuromuscular disorder (HCC)    Prostate cancer (HCC)    prostate cancer   Radiation    for prostate cancer   Radiation proctitis    rectum   Recurrent laryngeal nerve palsy    Respiratory failure (HCC)    s/p phrenic nerve palsy    Thrombocytopenia (HCC)    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 Swallow whole.     rosuvastatin (CRESTOR) 20 MG tablet Take 1 tablet (20 mg total) by mouth at bedtime. 90 tablet 3   Current Facility-Administered Medications on File Prior to Visit  Medication Dose Route Frequency Provider Last Rate Last Admin   0.9 %  sodium chloride infusion  500 mL Intravenous Continuous Hilarie Fredrickson, MD        No Known Allergies  Assessment/Plan:  1. Hyperlipidemia - Patient's last LDL 145 which is above goal of <55. This was drawn prior to starting rosuvastatin. Advised that next steps in lipid management would be to increase rosuvastatin or consider addition of other agents such as PCSK9i or inclisiran. Due to degree of coronary calcium, recommend addition of PCSK9i. Will place order for updated fasting lipid panel after two months of  rosuvastatin and if above goal, pursue PCSK9i therapy. Patient in agreement with plan.  Continue rosuvastatin 20mg  daily Recheck lipid panel in 2 months Complete PA for Repatha if LDL above goal  Laural Golden, PharmD, BCACP, CDCES, CPP [address], [address] [place], [place], [address] Phone: [phone], Fax: [phone]

## 2023-10-05 NOTE — Patient Instructions (Signed)
It was nice meeting you today  We would like your LDL (bad cholesterol) to be less than 55  Please continue your rosuvastatin 20mg  daily  I have placed a lab order for you to have your lipid panel rechecked in 2 months  If your LDL is still above goal we will pursue Repatha which is the injection you would take once every 2 weeks  We will contact you when your lab results come back  Please let us know if you have any questions  Laural Golden, PharmD, BCACP, CDCES, CPP [address] [place], [place], [address] Phone: [phone], Fax: [phone]

## 2023-10-08 ENCOUNTER — Encounter: Payer: Self-pay | Admitting: Cardiology

## 2023-10-09 ENCOUNTER — Telehealth: Payer: Self-pay

## 2023-10-09 NOTE — Telephone Encounter (Signed)
Spoke with pt and went over pre-op clearance letter with pt. Pt verbalized understanding and had no further questions. Informed pt that we would be forwarding this letter to the office of Ollen Gross, MD.

## 2023-10-09 NOTE — Telephone Encounter (Signed)
-----   Message from [facility] sent at 10/08/2023 11:38 AM EST ----- Regarding: preop letter See his chart and convey the pre-op letter to patient and Dr. Homero Fellers Aluicio.   Sunit Patterson, DO, [facility]

## 2023-10-16 NOTE — Patient Instructions (Signed)
SURGICAL WAITING ROOM VISITATION  Patients having surgery or a procedure may have no more than 2 support people in the waiting area - these visitors may rotate.    Children under the age of 59 must have an adult with them who is not the patient.  Due to an increase in RSV and influenza rates and associated hospitalizations, children ages 40 and under may not visit patients in [facility] hospitals.  Visitors with respiratory illnesses are discouraged from visiting and should remain at home.  If the patient needs to stay at the hospital during part of their recovery, the visitor guidelines for inpatient rooms apply. Pre-op nurse will coordinate an appropriate time for 1 support person to accompany patient in pre-op.  This support person may not rotate.    Please refer to the [facility] website for the visitor guidelines for Inpatients (after your surgery is over and you are in a regular room).       Your procedure is scheduled on:  10/30/23    Report to [facility] Main Entrance    Report to admitting at  0515 AM   Call this number if you have problems the morning of surgery [phone]   Do not eat food :After Midnight.   After Midnight you may have the following liquids until _ 0415_____ AM  DAY OF SURGERY  Water Non-Citrus Juices (without pulp, NO RED-Apple, White grape, White cranberry) Black Coffee (NO MILK/CREAM OR CREAMERS, sugar ok)  Clear Tea (NO MILK/CREAM OR CREAMERS, sugar ok) regular and decaf                             Plain Jell-O (NO RED)                                           Fruit ices (not with fruit pulp, NO RED)                                     Popsicles (NO RED)                                                               Sports drinks like Gatorade (NO RED)                   The day of surgery:  Drink ONE (1) Pre-Surgery Clear Ensure or G2 at 0415 AM  ( have completed by ) the morning of surgery. Drink in one sitting. Do not sip.   This drink was given to you during your hospital  pre-op appointment visit. Nothing else to drink after completing the  Pre-Surgery Clear Ensure or G2.          If you have questions, please contact your surgeon's office.      Oral Hygiene is also important to reduce your risk of infection.  Remember - BRUSH YOUR TEETH THE MORNING OF SURGERY WITH YOUR REGULAR TOOTHPASTE  DENTURES WILL BE REMOVED PRIOR TO SURGERY PLEASE DO NOT APPLY "Poly grip" OR ADHESIVES!!!   Do NOT smoke after Midnight   Stop all vitamins and herbal supplements 7 days before surgery.   Take these medicines the morning of surgery with A SIP OF WATER:  none   DO NOT TAKE ANY ORAL DIABETIC MEDICATIONS DAY OF YOUR SURGERY  Bring CPAP mask and tubing day of surgery.                              You may not have any metal on your body including hair pins, jewelry, and body piercing             Do not wear make-up, lotions, powders, perfumes/cologne, or deodorant  Do not wear nail polish including gel and S&S, artificial/acrylic nails, or any other type of covering on natural nails including finger and toenails. If you have artificial nails, gel coating, etc. that needs to be removed by a nail salon please have this removed prior to surgery or surgery may need to be canceled/ delayed if the surgeon/ anesthesia feels like they are unable to be safely monitored.   Do not shave  48 hours prior to surgery.               Men may shave face and neck.   Do not bring valuables to the hospital.  IS NOT             RESPONSIBLE   FOR VALUABLES.   Contacts, glasses, dentures or bridgework may not be worn into surgery.   Bring small overnight bag day of surgery.   DO NOT BRING YOUR HOME MEDICATIONS TO THE HOSPITAL. PHARMACY WILL DISPENSE MEDICATIONS LISTED ON YOUR MEDICATION LIST TO YOU DURING YOUR ADMISSION IN THE HOSPITAL!    Patients discharged on the day of surgery will  not be allowed to drive home.  Someone NEEDS to stay with you for the first 24 hours after anesthesia.   Special Instructions: Bring a copy of your healthcare power of attorney and living will documents the day of surgery if you haven't scanned them before.              Please read over the following fact sheets you were given: IF YOU HAVE QUESTIONS ABOUT YOUR PRE-OP INSTRUCTIONS PLEASE CALL [phone]   If you received a COVID test during your pre-op visit  it is requested that you wear a mask when out in public, stay away from anyone that may not be feeling well and notify your surgeon if you develop symptoms. If you test positive for Covid or have been in contact with anyone that has tested positive in the last 10 days please notify you surgeon.      Pre-operative 5 CHG Bath Instructions   You can play a key role in reducing the risk of infection after surgery. Your skin needs to be as free of germs as possible. You can reduce the number of germs on your skin by washing with CHG (chlorhexidine gluconate) soap before surgery. CHG is an antiseptic soap that kills germs and continues to kill germs even after washing.   DO NOT use if you have an allergy to chlorhexidine/CHG or antibacterial soaps. If your skin becomes reddened or irritated, stop using the CHG and notify one of our RNs at  [phone].   Please shower with the CHG soap starting 4 days before surgery using the following schedule:     Please keep in mind the following:  DO NOT shave, including legs and underarms, starting the day of your first shower.   You may shave your face at any point before/day of surgery.  Place clean sheets on your bed the day you start using CHG soap. Use a clean washcloth (not used since being washed) for each shower. DO NOT sleep with pets once you start using the CHG.   CHG Shower Instructions:  If you choose to wash your hair and private area, wash first with your normal shampoo/soap.  After  you use shampoo/soap, rinse your hair and body thoroughly to remove shampoo/soap residue.  Turn the water OFF and apply about 3 tablespoons (45 ml) of CHG soap to a CLEAN washcloth.  Apply CHG soap ONLY FROM YOUR NECK DOWN TO YOUR TOES (washing for 3-5 minutes)  DO NOT use CHG soap on face, private areas, open wounds, or sores.  Pay special attention to the area where your surgery is being performed.  If you are having back surgery, having someone wash your back for you may be helpful. Wait 2 minutes after CHG soap is applied, then you may rinse off the CHG soap.  Pat dry with a clean towel  Put on clean clothes/pajamas   If you choose to wear lotion, please use ONLY the CHG-compatible lotions on the back of this paper.     Additional instructions for the day of surgery: DO NOT APPLY any lotions, deodorants, cologne, or perfumes.   Put on clean/comfortable clothes.  Brush your teeth.  Ask your nurse before applying any prescription medications to the skin.      CHG Compatible Lotions   Aveeno Moisturizing lotion  Cetaphil Moisturizing Cream  Cetaphil Moisturizing Lotion  Clairol Herbal Essence Moisturizing Lotion, Dry Skin  Clairol Herbal Essence Moisturizing Lotion, Extra Dry Skin  Clairol Herbal Essence Moisturizing Lotion, Normal Skin  Curel Age Defying Therapeutic Moisturizing Lotion with Alpha Hydroxy  Curel Extreme Care Body Lotion  Curel Soothing Hands Moisturizing Hand Lotion  Curel Therapeutic Moisturizing Cream, Fragrance-Free  Curel Therapeutic Moisturizing Lotion, Fragrance-Free  Curel Therapeutic Moisturizing Lotion, Original Formula  Eucerin Daily Replenishing Lotion  Eucerin Dry Skin Therapy Plus Alpha Hydroxy Crme  Eucerin Dry Skin Therapy Plus Alpha Hydroxy Lotion  Eucerin Original Crme  Eucerin Original Lotion  Eucerin Plus Crme Eucerin Plus Lotion  Eucerin TriLipid Replenishing Lotion  Keri Anti-Bacterial Hand Lotion  Keri Deep Conditioning Original  Lotion Dry Skin Formula Softly Scented  Keri Deep Conditioning Original Lotion, Fragrance Free Sensitive Skin Formula  Keri Lotion Fast Absorbing Fragrance Free Sensitive Skin Formula  Keri Lotion Fast Absorbing Softly Scented Dry Skin Formula  Keri Original Lotion  Keri Skin Renewal Lotion Keri Silky Smooth Lotion  Keri Silky Smooth Sensitive Skin Lotion  Nivea Body Creamy Conditioning Oil  Nivea Body Extra Enriched Teacher, adult education Moisturizing Lotion Nivea Crme  Nivea Skin Firming Lotion  NutraDerm 30 Skin Lotion  NutraDerm Skin Lotion  NutraDerm Therapeutic Skin Cream  NutraDerm Therapeutic Skin Lotion  ProShield Protective Hand Cream  Provon moisturizing lotion

## 2023-10-16 NOTE — Progress Notes (Addendum)
 Anesthesia Review:  PCP: Darice Henle LOV 10/09/23 on chart - clearance in note and in Media tab dated 10/17/23.   Cardiologist : Sunit Tolia- LOV 09/15/23  DR Elspeth Millers LOV 05/02/23- hx of thymectomy  Chest x-ray : 05/02/23 - 2 view  EKG : 09/29/23  Echo : 09/11/23  CT Cors- 08/27/23  Stress test: Cardiac Cath :  09/29/23 - DR Peter Jordan  Activity level: can do a flight of stairs without difficulty  Sleep Study/ CPAP : none  Fasting Blood Sugar :      / Checks Blood Sugar -- times a day:   Blood Thinner/ Instructions /Last Dose: ASA / Instructions/ Last Dose :    Hgba1c-6.6 dated 10/10/23. On chart and in Media tab.    81 mg aspirin  09/15/23- cbc and CMP  Labs done at Dr Henle  and OV note done week of 10/10/23 requested and they are to fax over. Rerequested agaon pm of 10/17/23.  They are to fax.  On chart and in Media tab.

## 2023-10-17 ENCOUNTER — Other Ambulatory Visit: Payer: Self-pay

## 2023-10-17 ENCOUNTER — Encounter (HOSPITAL_COMMUNITY): Payer: Self-pay

## 2023-10-17 ENCOUNTER — Encounter (HOSPITAL_COMMUNITY)
Admission: RE | Admit: 2023-10-17 | Discharge: 2023-10-17 | Disposition: A | Discharge status: Home / Self Care | Payer: PPO | Source: Ambulatory Visit | Attending: Orthopedic Surgery | Admitting: Orthopedic Surgery

## 2023-10-17 VITALS — BP 119/71 | HR 68 | Temp 98.0°F | Resp 16 | Ht 69.0 in | Wt 178.0 lb

## 2023-10-17 DIAGNOSIS — Z01812 Encounter for preprocedural laboratory examination: Secondary | ICD-10-CM | POA: Insufficient documentation

## 2023-10-17 DIAGNOSIS — I4891 Unspecified atrial fibrillation: Secondary | ICD-10-CM | POA: Insufficient documentation

## 2023-10-17 DIAGNOSIS — Z01818 Encounter for other preprocedural examination: Secondary | ICD-10-CM

## 2023-10-17 DIAGNOSIS — Z923 Personal history of irradiation: Secondary | ICD-10-CM | POA: Insufficient documentation

## 2023-10-17 DIAGNOSIS — I2510 Atherosclerotic heart disease of native coronary artery without angina pectoris: Secondary | ICD-10-CM | POA: Diagnosis not present

## 2023-10-17 DIAGNOSIS — C61 Malignant neoplasm of prostate: Secondary | ICD-10-CM | POA: Insufficient documentation

## 2023-10-17 DIAGNOSIS — J3800 Paralysis of vocal cords and larynx, unspecified: Secondary | ICD-10-CM | POA: Diagnosis not present

## 2023-10-17 DIAGNOSIS — M1712 Unilateral primary osteoarthritis, left knee: Secondary | ICD-10-CM | POA: Insufficient documentation

## 2023-10-17 HISTORY — DX: Dyspnea, unspecified: R06.00

## 2023-10-17 LAB — SURGICAL PCR SCREEN
MRSA, PCR: NEGATIVE
Staphylococcus aureus: NEGATIVE

## 2023-10-18 NOTE — Anesthesia Preprocedure Evaluation (Addendum)
Anesthesia Evaluation  Patient identified by MRN, date of birth, ID band Patient awake    Reviewed: Allergy & Precautions, H&P , NPO status , Patient's Chart, lab work & pertinent test results  Airway Mallampati: II  TM Distance: >3 FB Neck ROM: Full    Dental no notable dental hx.    Pulmonary neg pulmonary ROS   Pulmonary exam normal breath sounds clear to auscultation       Cardiovascular + CAD  Normal cardiovascular exam+ dysrhythmias Atrial Fibrillation  Rhythm:Regular Rate:Normal     Neuro/Psych negative neurological ROS  negative psych ROS   GI/Hepatic negative GI ROS, Neg liver ROS,,,  Endo/Other  negative endocrine ROS    Renal/GU negative Renal ROS  negative genitourinary   Musculoskeletal  (+) Arthritis ,    Abdominal   Peds negative pediatric ROS (+)  Hematology negative hematology ROS (+)   Anesthesia Other Findings   Reproductive/Obstetrics negative OB ROS                             Anesthesia Physical Anesthesia Plan  ASA: 3  Anesthesia Plan: Spinal and Regional   Post-op Pain Management: Regional block*   Induction:   PONV Risk Score and Plan: Treatment may vary due to age or medical condition  Airway Management Planned: Natural Airway  Additional Equipment:   Intra-op Plan:   Post-operative Plan:   Informed Consent: I have reviewed the patients History and Physical, chart, labs and discussed the procedure including the risks, benefits and alternatives for the proposed anesthesia with the patient or authorized representative who has indicated his/her understanding and acceptance.     Dental advisory given  Plan Discussed with: CRNA  Anesthesia Plan Comments: (See PAT note from 2/4 by K Gekas PA-C    [name] [name] is an 83 yo male who presents to PAT prior to surgery above. 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 He had resection in 2016 which was c/b left phrenic and laryngeal nerve palsy. He is still followed by CT surgery and last seen on 05/02/23. Per Dr. Dorris Fetch: "He has pretty good exercise tolerance on level ground but rapidly gets short of breath with walking up stairs or an incline." Advised to f/u in 1 year.   Patient saw Cardiology for pre op clearance. He reported DOE which is chronic however due to his multiple co-morbidities and abnormal CTA of coronaries he underwent LHC on 09/29/23. This showed nonobstructive CAD with normal LV function. Medical management was recommended and he was cleared for knee surgery.   Patient has had recurrence of his metastatic prostate cancer and follows with Oncology at [facility]. Last seen on 07/25/23. He completed 6 months of orgovyx and xtandi and now PSA levels are being monitored. )        Anesthesia Quick Evaluation

## 2023-10-18 NOTE — Progress Notes (Signed)
 Case: [id number] Date/Time: 10/30/23 0700   Procedure: TOTAL KNEE ARTHROPLASTY (Left: Knee)   Anesthesia type: Choice   Pre-op diagnosis: left knee osteoarthritis   [facility]: [facility] ROOM 09 / [facility]   Surgeons: Melodi Lerner, MD       DISCUSSION: [name] [name] is an 83 yo male who presents to PAT prior to surgery above. 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 He had resection in 2016 which was c/b left phrenic and laryngeal nerve palsy. He is still followed by CT surgery and last seen on 05/02/23. Per Dr. Kerrin: He has pretty good exercise tolerance on level ground but rapidly gets short of breath with walking up stairs or an incline. Advised to f/u in 1 year.  Patient saw Cardiology for pre op clearance. He reported DOE which is chronic however due to his multiple co-morbidities and abnormal CTA of coronaries he underwent LHC on 09/29/23. This showed nonobstructive CAD with normal LV function. Medical management was recommended and he was cleared for knee surgery.  Patient has had recurrence of his metastatic prostate cancer and follows with Oncology at [facility] Last seen on 07/25/23. He completed 6 months of orgovyx and xtandi and now PSA levels are being monitored.  VS: BP 119/71   Pulse 68   Temp 36.7 C (Oral)   Resp 16   Ht 5' 9 (1.753 m)   Wt 80.7 kg   SpO2 96%   BMI 26.29 kg/m   PROVIDERS: Burney Darice CROME, MD Cardiologist: Madonna Large, DO CT surgery: Elspeth Kerrin, MD Oncology: Toribio Bare, MD ([facility])  LABS: Labs reviewed: Acceptable for surgery. (all labs ordered are listed, but only abnormal results are displayed)  Labs Reviewed  SURGICAL PCR SCREEN     IMAGES: CXR 05/02/23:  FINDINGS: Clear lungs. Stable cardiac and mediastinal  contours with surgical clips in the anterior mediastinum. No pleural effusion or pneumothorax. Visualized bones and upper abdomen are unremarkable.   IMPRESSION: No evidence of acute cardiopulmonary disease.  EKG 09/29/23:  Sinus bradycardia with 1st degree A-V block rate 54  CV:  LHC 09/29/23:  Nonobstructive CAD Normal LV function Normal LVEDP   Plan: risk factor modification. Patient is cleared for knee surgery.  Echo 09/11/23:  IMPRESSIONS    1. Left ventricular ejection fraction, by estimation, is 55 to 60%. The left ventricle has normal function. The left ventricle has no regional wall motion abnormalities. Left ventricular diastolic function could not be evaluated.  2. Right ventricular systolic function is normal. The right ventricular size is normal. There is normal pulmonary artery systolic pressure.  3. The mitral valve is normal in structure. No evidence of mitral valve regurgitation. No evidence of mitral stenosis.  4. The aortic valve is normal in structure. There is mild calcification of the aortic valve. Aortic valve regurgitation is not visualized. No aortic stenosis is present.  5. Aortic dilatation noted. There is dilatation of the aortic root, measuring 41 mm. There is dilatation of the ascending aorta, measuring 39 mm.  6. The inferior vena cava is normal in size with greater than 50% respiratory variability, suggesting right atrial pressure of 3 mmHg.  CTA coronaries 08/17/23:  IMPRESSION: 1. Ill-defined areas of ground-glass within the left lower lobe, not seen on prior exam, may be infectious  or inflammatory. This is not in a configuration typical of hypoventilatory change. 2. Elevated right hemidiaphragm with adjacent compressive atelectasis or scarring. 3. The right middle lobe nodule on prior PET is not confidently seen on the current exam. 4. Unchanged size of 4 mm posterior mediastinal node that was tracer avid on prior PET.   Aortic  Atherosclerosis (ICD10-I70.0).  FFR:  IMPRESSION: 1. Moderate stenosis distal LAD, borderline hemodynamic significance by FFR.   3. Moderate stenosis distal RCA into bifurcation of PLV and PDA. Could not be assessed by FFR. Looks to be of borderline hemodynamic   significance. 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    Colon polyps    hyperplastic and tubular adenomatous   Coronary atherosclerosis due to calcified coronary lesion    Diverticulosis    Dyspnea    Dysrhythmia    hx at fib- ablation 8 yrs ago   Erectile dysfunction following radical prostatectomy    Hypercapnic respiratory failure, chronic (HCC) 05/01/2015   Mediastinal mass    per CT CHEST 11/07/14 @ [facility].SABRAANTERIOR...4.2 X 3.3 cm soft tissue   Neuromuscular disorder (HCC)    Prostate cancer [facility])    prostate cancer   Radiation    for prostate cancer   Radiation proctitis    rectum   Recurrent laryngeal nerve palsy    Respiratory failure (HCC)    s/p phrenic nerve palsy    Thrombocytopenia (HCC)    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     Past Surgical History:  Procedure Laterality Date   ATRIAL ABLATION SURGERY     for a fib x 5 years ago   BACK SURGERY     cataract surgery      COLONOSCOPY     CYSTOSCOPY N/A 11/26/2014   Procedure: CYSTOSCOPY FLEXIBLE;  Surgeon: Glendia Elizabeth, MD;  Location: [facility] OR;  Service: Urology;  Laterality: N/A;   CYSTOSCOPY WITH URETHRAL DILATATION N/A 11/26/2014   Procedure: CYSTOSCOPY WITH URETHRAL DILATATION WITH INSERTION OF FOLEY;  Surgeon: Glendia Elizabeth, MD;  Location: [facility] OR;  Service: Urology;  Laterality: N/A;   FLEXIBLE SIGMOIDOSCOPY  02/10/2012   Procedure: FLEXIBLE SIGMOIDOSCOPY;  Surgeon: Norleen LOISE Kiang, MD;  Location: [facility];   Service: Endoscopy;  Laterality: N/A;  need APC    KNEE ARTHROSCOPY Left    LEAD REMOVAL N/A 11/26/2014   Procedure: CRYO INTERCOSTAL NERVE BLOCK;  Surgeon: Elspeth JAYSON Millers, MD;  Location: [facility] OR;  Service: Thoracic;  Laterality: N/A;   LEFT HEART CATH AND CORONARY ANGIOGRAPHY N/A 09/29/2023   Procedure: LEFT HEART CATH AND CORONARY ANGIOGRAPHY;  Surgeon: Jordan, Peter M, MD;  Location: [facility] INVASIVE CV LAB;  Service: Cardiovascular;  Laterality: N/A;   POLYPECTOMY     PROSTATE SURGERY     RESECTION OF MEDIASTINAL MASS N/A 11/26/2014   Procedure: RESECTION OF ANTERIOR MEDIASTINAL MASS;  Surgeon: Elspeth JAYSON Millers, MD;  Location: [facility] OR;  Service: Thoracic;  Laterality: N/A;   ROBOT ASSISTED LAPAROSCOPIC RADICAL PROSTATECTOMY  09/13/2007   radiation Tx 2011   TONSILLECTOMY     VASECTOMY     VIDEO ASSISTED THORACOSCOPY Left 11/26/2014   Procedure: VIDEO ASSISTED THORACOSCOPY;  Surgeon: Elspeth JAYSON Millers, MD;  Location: [facility] OR;  Service: Thoracic;  Laterality: Left;    MEDICATIONS:  aspirin   EC 81 MG tablet   rosuvastatin  (CRESTOR ) 20 MG tablet    0.9 %  sodium chloride  infusion   Burnard CHRISTELLA Odis DEVONNA [facility] Surgical Short Stay/Anesthesiology [facility] Phone [phone] 10/18/2023 1:32 PM

## 2023-10-30 ENCOUNTER — Encounter (HOSPITAL_COMMUNITY): Payer: Self-pay | Admitting: Orthopedic Surgery

## 2023-10-30 ENCOUNTER — Ambulatory Visit (HOSPITAL_COMMUNITY): Payer: PPO | Admitting: Certified Registered"

## 2023-10-30 ENCOUNTER — Other Ambulatory Visit: Payer: Self-pay

## 2023-10-30 ENCOUNTER — Ambulatory Visit (HOSPITAL_COMMUNITY): Payer: PPO | Admitting: Medical

## 2023-10-30 ENCOUNTER — Encounter (HOSPITAL_COMMUNITY)
Admission: RE | Disposition: A | Discharge status: Home / Self Care | Payer: Self-pay | Source: Home / Self Care | Attending: Orthopedic Surgery

## 2023-10-30 ENCOUNTER — Inpatient Hospital Stay (HOSPITAL_COMMUNITY)
Admission: RE | Admit: 2023-10-30 | Discharge: 2023-11-04 | DRG: 470 | Disposition: A | Discharge status: Home / Self Care | Payer: PPO | Attending: Orthopedic Surgery | Admitting: Orthopedic Surgery

## 2023-10-30 DIAGNOSIS — Z7901 Long term (current) use of anticoagulants: Secondary | ICD-10-CM

## 2023-10-30 DIAGNOSIS — I4891 Unspecified atrial fibrillation: Secondary | ICD-10-CM

## 2023-10-30 DIAGNOSIS — Z01818 Encounter for other preprocedural examination: Secondary | ICD-10-CM

## 2023-10-30 DIAGNOSIS — I2510 Atherosclerotic heart disease of native coronary artery without angina pectoris: Secondary | ICD-10-CM | POA: Diagnosis present

## 2023-10-30 DIAGNOSIS — Z923 Personal history of irradiation: Secondary | ICD-10-CM

## 2023-10-30 DIAGNOSIS — Z7982 Long term (current) use of aspirin: Secondary | ICD-10-CM

## 2023-10-30 DIAGNOSIS — Z79899 Other long term (current) drug therapy: Secondary | ICD-10-CM

## 2023-10-30 DIAGNOSIS — M1712 Unilateral primary osteoarthritis, left knee: Secondary | ICD-10-CM

## 2023-10-30 DIAGNOSIS — Z8249 Family history of ischemic heart disease and other diseases of the circulatory system: Secondary | ICD-10-CM

## 2023-10-30 DIAGNOSIS — M179 Osteoarthritis of knee, unspecified: Principal | ICD-10-CM | POA: Diagnosis present

## 2023-10-30 DIAGNOSIS — H905 Unspecified sensorineural hearing loss: Secondary | ICD-10-CM | POA: Diagnosis present

## 2023-10-30 DIAGNOSIS — Z8042 Family history of malignant neoplasm of prostate: Secondary | ICD-10-CM

## 2023-10-30 DIAGNOSIS — Z8546 Personal history of malignant neoplasm of prostate: Secondary | ICD-10-CM

## 2023-10-30 DIAGNOSIS — I480 Paroxysmal atrial fibrillation: Secondary | ICD-10-CM | POA: Diagnosis present

## 2023-10-30 HISTORY — PX: TOTAL KNEE ARTHROPLASTY: SHX125

## 2023-10-30 SURGERY — ARTHROPLASTY, KNEE, TOTAL
Anesthesia: Regional | Site: Knee | Laterality: Left

## 2023-10-30 MED ORDER — DIPHENHYDRAMINE HCL 12.5 MG/5ML PO ELIX: 12.5000 mg | ORAL_SOLUTION | ORAL | Status: DC | PRN

## 2023-10-30 MED ORDER — MENTHOL 3 MG MT LOZG: 1.0000 | LOZENGE | OROMUCOSAL | Status: DC | PRN

## 2023-10-30 MED ORDER — SODIUM CHLORIDE (PF) 0.9 % IJ SOLN
INTRAMUSCULAR | Status: AC
  Filled 2023-10-30: qty 10

## 2023-10-30 MED ORDER — METOCLOPRAMIDE HCL 5 MG PO TABS: 5.0000 mg | ORAL_TABLET | Freq: Three times a day (TID) | ORAL | Status: DC | PRN

## 2023-10-30 MED ORDER — OXYCODONE HCL 5 MG/5ML PO SOLN: 5.0000 mg | Freq: Once | ORAL | Status: DC | PRN

## 2023-10-30 MED ORDER — TRAMADOL HCL 50 MG PO TABS
50.0000 mg | ORAL_TABLET | Freq: Four times a day (QID) | ORAL | Status: DC | PRN
  Administered 2023-10-30: 50 mg via ORAL
  Administered 2023-10-31: 100 mg via ORAL
  Administered 2023-10-31 – 2023-11-02 (×2): 50 mg via ORAL
  Administered 2023-11-03: 100 mg via ORAL
  Administered 2023-11-03: 50 mg via ORAL
  Administered 2023-11-04: 100 mg via ORAL
  Filled 2023-10-30: qty 2
  Filled 2023-10-30 (×2): qty 1
  Filled 2023-10-30 (×2): qty 2
  Filled 2023-10-30: qty 1
  Filled 2023-10-30: qty 2
  Filled 2023-10-30: qty 1

## 2023-10-30 MED ORDER — OXYCODONE HCL 5 MG PO TABS
10.0000 mg | ORAL_TABLET | ORAL | Status: DC | PRN
  Administered 2023-10-30: 10 mg via ORAL

## 2023-10-30 MED ORDER — LACTATED RINGERS IV SOLN: INTRAVENOUS | Status: DC

## 2023-10-30 MED ORDER — SODIUM CHLORIDE 0.9 % IV SOLN: INTRAVENOUS | Status: DC

## 2023-10-30 MED ORDER — BUPIVACAINE LIPOSOME 1.3 % IJ SUSP: 20.0000 mL | Freq: Once | INTRAMUSCULAR | Status: DC

## 2023-10-30 MED ORDER — BUPIVACAINE LIPOSOME 1.3 % IJ SUSP
INTRAMUSCULAR | Status: AC
  Filled 2023-10-30: qty 20

## 2023-10-30 MED ORDER — FENTANYL CITRATE (PF) 100 MCG/2ML IJ SOLN
INTRAMUSCULAR | Status: DC | PRN
  Administered 2023-10-30 (×4): 25 ug via INTRAVENOUS

## 2023-10-30 MED ORDER — SODIUM CHLORIDE 0.9 % IR SOLN
Status: DC | PRN
  Administered 2023-10-30: 1000 mL

## 2023-10-30 MED ORDER — OXYCODONE HCL 5 MG PO TABS: 5.0000 mg | ORAL_TABLET | ORAL | Status: DC | PRN

## 2023-10-30 MED ORDER — BISACODYL 10 MG RE SUPP: 10.0000 mg | Freq: Every day | RECTAL | Status: DC | PRN

## 2023-10-30 MED ORDER — SODIUM CHLORIDE (PF) 0.9 % IJ SOLN
INTRAMUSCULAR | Status: DC | PRN
  Administered 2023-10-30: 60 mL

## 2023-10-30 MED ORDER — PHENOL 1.4 % MT LIQD: 1.0000 | OROMUCOSAL | Status: DC | PRN

## 2023-10-30 MED ORDER — ONDANSETRON HCL 4 MG PO TABS: 4.0000 mg | ORAL_TABLET | Freq: Four times a day (QID) | ORAL | Status: DC | PRN

## 2023-10-30 MED ORDER — CEFAZOLIN SODIUM-DEXTROSE 2-4 GM/100ML-% IV SOLN
2.0000 g | Freq: Four times a day (QID) | INTRAVENOUS | Status: AC
  Administered 2023-10-30 (×2): 2 g via INTRAVENOUS
  Filled 2023-10-30 (×2): qty 100

## 2023-10-30 MED ORDER — ACETAMINOPHEN 500 MG PO TABS: 1000.0000 mg | ORAL_TABLET | Freq: Once | ORAL | Status: DC

## 2023-10-30 MED ORDER — 0.9 % SODIUM CHLORIDE (POUR BTL) OPTIME
TOPICAL | Status: DC | PRN
  Administered 2023-10-30: 1000 mL

## 2023-10-30 MED ORDER — TRANEXAMIC ACID-NACL 1000-0.7 MG/100ML-% IV SOLN
1000.0000 mg | INTRAVENOUS | Status: AC
  Administered 2023-10-30: 1000 mg via INTRAVENOUS
  Filled 2023-10-30: qty 100

## 2023-10-30 MED ORDER — BUPIVACAINE IN DEXTROSE 0.75-8.25 % IT SOLN
INTRATHECAL | Status: DC | PRN
  Administered 2023-10-30: 1.6 mL via INTRATHECAL

## 2023-10-30 MED ORDER — FENTANYL CITRATE (PF) 100 MCG/2ML IJ SOLN
INTRAMUSCULAR | Status: AC
  Filled 2023-10-30: qty 2

## 2023-10-30 MED ORDER — FENTANYL CITRATE PF 50 MCG/ML IJ SOSY: 25.0000 ug | PREFILLED_SYRINGE | INTRAMUSCULAR | Status: DC | PRN

## 2023-10-30 MED ORDER — ACETAMINOPHEN 500 MG PO TABS
1000.0000 mg | ORAL_TABLET | Freq: Four times a day (QID) | ORAL | Status: AC
  Administered 2023-10-30 – 2023-10-31 (×4): 1000 mg via ORAL
  Filled 2023-10-30 (×4): qty 2

## 2023-10-30 MED ORDER — POVIDONE-IODINE 10 % EX SWAB: 2.0000 | Freq: Once | CUTANEOUS | Status: DC

## 2023-10-30 MED ORDER — EPHEDRINE SULFATE-NACL 50-0.9 MG/10ML-% IV SOSY
PREFILLED_SYRINGE | INTRAVENOUS | Status: DC | PRN
Start: 2023-10-30 — End: 2023-10-30
  Administered 2023-10-30: 5 mg via INTRAVENOUS

## 2023-10-30 MED ORDER — DROPERIDOL 2.5 MG/ML IJ SOLN: 0.6250 mg | Freq: Once | INTRAMUSCULAR | Status: DC | PRN

## 2023-10-30 MED ORDER — ACETAMINOPHEN 10 MG/ML IV SOLN: 1000.0000 mg | Freq: Once | INTRAVENOUS | Status: DC | PRN

## 2023-10-30 MED ORDER — POLYETHYLENE GLYCOL 3350 17 G PO PACK
17.0000 g | PACK | Freq: Every day | ORAL | Status: DC | PRN
  Filled 2023-10-30: qty 1

## 2023-10-30 MED ORDER — ROPIVACAINE HCL 5 MG/ML IJ SOLN
INTRAMUSCULAR | Status: DC | PRN
  Administered 2023-10-30: 20 mL via PERINEURAL

## 2023-10-30 MED ORDER — CHLORHEXIDINE GLUCONATE 0.12 % MT SOLN
15.0000 mL | Freq: Once | OROMUCOSAL | Status: AC
  Administered 2023-10-30: 15 mL via OROMUCOSAL

## 2023-10-30 MED ORDER — GLYCOPYRROLATE PF 0.2 MG/ML IJ SOSY
PREFILLED_SYRINGE | INTRAMUSCULAR | Status: DC | PRN
  Administered 2023-10-30: .2 mg via INTRAVENOUS

## 2023-10-30 MED ORDER — METHOCARBAMOL 1000 MG/10ML IJ SOLN
500.0000 mg | Freq: Four times a day (QID) | INTRAMUSCULAR | Status: DC | PRN
  Filled 2023-10-30: qty 10

## 2023-10-30 MED ORDER — DOCUSATE SODIUM 100 MG PO CAPS
100.0000 mg | ORAL_CAPSULE | Freq: Two times a day (BID) | ORAL | Status: DC
  Administered 2023-10-30 – 2023-11-02 (×7): 100 mg via ORAL
  Filled 2023-10-30 (×8): qty 1

## 2023-10-30 MED ORDER — SODIUM CHLORIDE (PF) 0.9 % IJ SOLN
INTRAMUSCULAR | Status: AC
  Filled 2023-10-30: qty 50

## 2023-10-30 MED ORDER — CEFAZOLIN SODIUM-DEXTROSE 2-4 GM/100ML-% IV SOLN
2.0000 g | INTRAVENOUS | Status: AC
  Administered 2023-10-30: 2 g via INTRAVENOUS
  Filled 2023-10-30: qty 100

## 2023-10-30 MED ORDER — OXYCODONE HCL 5 MG PO TABS
10.0000 mg | ORAL_TABLET | ORAL | Status: DC | PRN
  Administered 2023-10-30 (×2): 10 mg via ORAL
  Administered 2023-10-31 (×2): 15 mg via ORAL
  Filled 2023-10-30 (×2): qty 2
  Filled 2023-10-30 (×2): qty 3

## 2023-10-30 MED ORDER — DEXAMETHASONE SODIUM PHOSPHATE 10 MG/ML IJ SOLN
10.0000 mg | Freq: Once | INTRAMUSCULAR | Status: AC
  Administered 2023-10-31: 10 mg via INTRAVENOUS
  Filled 2023-10-30: qty 1

## 2023-10-30 MED ORDER — METOCLOPRAMIDE HCL 5 MG/ML IJ SOLN: 5.0000 mg | Freq: Three times a day (TID) | INTRAMUSCULAR | Status: DC | PRN

## 2023-10-30 MED ORDER — MORPHINE SULFATE (PF) 2 MG/ML IV SOLN
1.0000 mg | INTRAVENOUS | Status: DC | PRN
  Administered 2023-10-31 (×2): 2 mg via INTRAVENOUS
  Filled 2023-10-30 (×2): qty 1

## 2023-10-30 MED ORDER — PHENYLEPHRINE HCL-NACL 20-0.9 MG/250ML-% IV SOLN
INTRAVENOUS | Status: DC | PRN
  Administered 2023-10-30: 30 ug/min via INTRAVENOUS

## 2023-10-30 MED ORDER — RIVAROXABAN 10 MG PO TABS
10.0000 mg | ORAL_TABLET | Freq: Every day | ORAL | Status: DC
  Administered 2023-10-31 – 2023-11-04 (×5): 10 mg via ORAL
  Filled 2023-10-30 (×5): qty 1

## 2023-10-30 MED ORDER — BUPIVACAINE LIPOSOME 1.3 % IJ SUSP
INTRAMUSCULAR | Status: DC | PRN
  Administered 2023-10-30: 20 mL

## 2023-10-30 MED ORDER — DEXAMETHASONE SODIUM PHOSPHATE 10 MG/ML IJ SOLN
8.0000 mg | Freq: Once | INTRAMUSCULAR | Status: AC
  Administered 2023-10-30: 8 mg via INTRAVENOUS

## 2023-10-30 MED ORDER — PROPOFOL 10 MG/ML IV BOLUS
INTRAVENOUS | Status: DC | PRN
Start: 2023-10-30 — End: 2023-10-30
  Administered 2023-10-30: 20 mg via INTRAVENOUS

## 2023-10-30 MED ORDER — STERILE WATER FOR IRRIGATION IR SOLN
Status: DC | PRN
  Administered 2023-10-30: 1000 mL

## 2023-10-30 MED ORDER — ONDANSETRON HCL 4 MG/2ML IJ SOLN
INTRAMUSCULAR | Status: DC | PRN
  Administered 2023-10-30: 4 mg via INTRAVENOUS

## 2023-10-30 MED ORDER — PROPOFOL 500 MG/50ML IV EMUL
INTRAVENOUS | Status: DC | PRN
  Administered 2023-10-30: 30 ug/kg/min via INTRAVENOUS

## 2023-10-30 MED ORDER — OXYCODONE HCL 5 MG PO TABS: 5.0000 mg | ORAL_TABLET | Freq: Once | ORAL | Status: DC | PRN

## 2023-10-30 MED ORDER — FLEET ENEMA RE ENEM: 1.0000 | ENEMA | Freq: Once | RECTAL | Status: DC | PRN

## 2023-10-30 MED ORDER — ONDANSETRON HCL 4 MG/2ML IJ SOLN: 4.0000 mg | Freq: Four times a day (QID) | INTRAMUSCULAR | Status: DC | PRN

## 2023-10-30 MED ORDER — ACETAMINOPHEN 325 MG PO TABS
325.0000 mg | ORAL_TABLET | Freq: Four times a day (QID) | ORAL | Status: DC | PRN
  Administered 2023-11-02 – 2023-11-04 (×4): 650 mg via ORAL
  Filled 2023-10-30 (×4): qty 2

## 2023-10-30 MED ORDER — METHOCARBAMOL 500 MG PO TABS
500.0000 mg | ORAL_TABLET | Freq: Four times a day (QID) | ORAL | Status: DC | PRN
  Administered 2023-10-30 – 2023-11-04 (×9): 500 mg via ORAL
  Filled 2023-10-30 (×9): qty 1

## 2023-10-30 MED ORDER — ACETAMINOPHEN 10 MG/ML IV SOLN
1000.0000 mg | Freq: Four times a day (QID) | INTRAVENOUS | Status: DC
  Administered 2023-10-30: 1000 mg via INTRAVENOUS
  Filled 2023-10-30: qty 100

## 2023-10-30 MED ORDER — ORAL CARE MOUTH RINSE: 15.0000 mL | Freq: Once | OROMUCOSAL | Status: AC

## 2023-10-30 MED ORDER — OXYCODONE HCL 5 MG PO TABS
5.0000 mg | ORAL_TABLET | ORAL | Status: DC | PRN
  Administered 2023-10-30: 10 mg via ORAL
  Filled 2023-10-30 (×2): qty 2

## 2023-10-30 SURGICAL SUPPLY — 44 items
ATTUNE MED DOME PAT 38 KNEE (Knees) IMPLANT
ATTUNE PS FEM LT SZ 6 CEM KNEE (Femur) IMPLANT
ATTUNE PSRP INSR SZ6 8 KNEE (Insert) IMPLANT
BAG COUNTER SPONGE SURGICOUNT (BAG) IMPLANT
BAG ZIPLOCK 12X15 (MISCELLANEOUS) ×1 IMPLANT
BASE TIBIA ATTUNE KNEE SYS SZ6 (Knees) IMPLANT
BLADE SAG 18X100X1.27 (BLADE) ×1 IMPLANT
BLADE SAW SGTL 11.0X1.19X90.0M (BLADE) ×1 IMPLANT
BNDG ELASTIC 6INX 5YD STR LF (GAUZE/BANDAGES/DRESSINGS) ×1 IMPLANT
BOWL SMART MIX CTS (DISPOSABLE) ×1 IMPLANT
CEMENT HV SMART SET (Cement) ×2 IMPLANT
COVER SURGICAL LIGHT HANDLE (MISCELLANEOUS) ×1 IMPLANT
CUFF TRNQT CYL 34X4.125X (TOURNIQUET CUFF) ×1 IMPLANT
DERMABOND ADVANCED .7 DNX12 (GAUZE/BANDAGES/DRESSINGS) ×1 IMPLANT
DRAPE U-SHAPE 47X51 STRL (DRAPES) ×1 IMPLANT
DRSG AQUACEL AG ADV 3.5X10 (GAUZE/BANDAGES/DRESSINGS) ×1 IMPLANT
DURAPREP 26ML APPLICATOR (WOUND CARE) ×1 IMPLANT
ELECT REM PT RETURN 15FT ADLT (MISCELLANEOUS) ×1 IMPLANT
GLOVE BIO SURGEON STRL SZ 6.5 (GLOVE) IMPLANT
GLOVE BIO SURGEON STRL SZ7 (GLOVE) IMPLANT
GLOVE BIO SURGEON STRL SZ8 (GLOVE) ×1 IMPLANT
GLOVE BIOGEL PI IND STRL 7.0 (GLOVE) IMPLANT
GLOVE BIOGEL PI IND STRL 8 (GLOVE) ×1 IMPLANT
GOWN STRL REUS W/ TWL LRG LVL3 (GOWN DISPOSABLE) ×1 IMPLANT
HOLDER FOLEY CATH W/STRAP (MISCELLANEOUS) IMPLANT
IMMOBILIZER KNEE 20 (SOFTGOODS) ×1 IMPLANT
IMMOBILIZER KNEE 20 THIGH 36 (SOFTGOODS) ×1 IMPLANT
KIT TURNOVER KIT A (KITS) IMPLANT
MANIFOLD NEPTUNE II (INSTRUMENTS) ×1 IMPLANT
NS IRRIG 1000ML POUR BTL (IV SOLUTION) ×1 IMPLANT
PACK TOTAL KNEE CUSTOM (KITS) ×1 IMPLANT
PADDING CAST COTTON 6X4 STRL (CAST SUPPLIES) ×2 IMPLANT
PIN STEINMAN FIXATION KNEE (PIN) IMPLANT
PROTECTOR NERVE ULNAR (MISCELLANEOUS) ×1 IMPLANT
SET HNDPC FAN SPRY TIP SCT (DISPOSABLE) ×1 IMPLANT
SUT MNCRL AB 4-0 PS2 18 (SUTURE) ×1 IMPLANT
SUT STRATAFIX 0 PDS 27 VIOLET (SUTURE) ×1 IMPLANT
SUT VIC AB 2-0 CT1 TAPERPNT 27 (SUTURE) ×3 IMPLANT
SUTURE STRATFX 0 PDS 27 VIOLET (SUTURE) ×1 IMPLANT
TIBIA ATTUNE KNEE SYS BASE SZ6 (Knees) ×1 IMPLANT
TRAY FOLEY MTR SLVR 16FR STAT (SET/KITS/TRAYS/PACK) IMPLANT
TUBE SUCTION HIGH CAP CLEAR NV (SUCTIONS) ×1 IMPLANT
WATER STERILE IRR 1000ML POUR (IV SOLUTION) ×2 IMPLANT
WRAP KNEE MAXI GEL POST OP (GAUZE/BANDAGES/DRESSINGS) ×1 IMPLANT

## 2023-10-30 NOTE — Evaluation (Signed)
 Physical Therapy Evaluation Patient Details Name: [name] [name] MRN: [id number] DOB: Oct 18, 1940 Today's Date: 10/30/2023  History of Present Illness  83 yo male s/p L TKA on 10/30/23. PMH: lumbar laminectomy, prostate CA s/p prostatectomy, vocal cord paralysis, afib-s/p ablation, CAD, thymectomy, resection mediastinal mass.  Clinical Impression  Pt is s/p TKA resulting in the deficits listed below (see PT Problem List).  PT very pleasant and motivated to work with PT. Amb 1' with RW and CGA-min assist; anticipate steady progress in acute setting. Reviewed importance of terminal knee extension and initiated HEP  Pt will benefit from acute skilled PT to increase their independence and safety with mobility to allow discharge.          If plan is discharge home, recommend the following: Help with stairs or ramp for entrance;Assistance with cooking/housework;Assist for transportation   Can travel by private vehicle        Equipment Recommendations None recommended by PT  Recommendations for Other Services       Functional Status Assessment Patient has had a recent decline in their functional status and demonstrates the ability to make significant improvements in function in a reasonable and predictable amount of time.     Precautions / Restrictions Precautions Precautions: Knee Required Braces or Orthoses: Knee Immobilizer - Left      Mobility  Bed Mobility Overal bed mobility: Needs Assistance Bed Mobility: Supine to Sit     Supine to sit: Contact guard, Supervision     General bed mobility comments: incr time, no physical assist    Transfers Overall transfer level: Needs assistance Equipment used: Rolling walker (2 wheels) Transfers: Sit to/from Stand Sit to Stand: Min assist           General transfer comment: cues for proper hand placement and LLE position    Ambulation/Gait Ambulation/Gait assistance: Min assist, Contact guard assist Gait Distance (Feet):  50 Feet Assistive device: Rolling walker (2 wheels) Gait Pattern/deviations: Step-to pattern, Decreased weight shift to left       General Gait Details: cues for sequence, RW position  Stairs            Wheelchair Mobility     Tilt Bed    Modified Rankin (Stroke Patients Only)       Balance                                             Pertinent Vitals/Pain Pain Assessment Pain Assessment: 0-10 Pain Score: 3  Pain Location: L knee Pain Descriptors / Indicators: Aching, Sore Pain Intervention(s): Limited activity within patient's tolerance, Monitored during session, Premedicated before session, Repositioned    Home Living Family/patient expects to be discharged to:: Private residence Living Arrangements: Spouse/significant other Available Help at Discharge: Family Type of Home: House Home Access: Ramped entrance       Home Layout: Two level Home Equipment: Agricultural consultant (2 wheels)      Prior Function Prior Level of Function : Independent/Modified Independent                     Extremity/Trunk Assessment   Upper Extremity Assessment Upper Extremity Assessment: Overall WFL for tasks assessed    Lower Extremity Assessment Lower Extremity Assessment: LLE deficits/detail LLE Deficits / Details: ankle WFL, knee extension and hip flexion 3/5       Communication   Communication Communication:  No apparent difficulties    Cognition Arousal: Alert Behavior During Therapy: WFL for tasks assessed/performed   PT - Cognitive impairments: No apparent impairments                         Following commands: Intact       Cueing Cueing Techniques: Verbal cues     General Comments      Exercises Total Joint Exercises Ankle Circles/Pumps: AROM, Both Quad Sets: AROM, 5 reps, Left   Assessment/Plan    PT Assessment Patient needs continued PT services  PT Problem List Decreased strength;Decreased activity  tolerance;Decreased knowledge of use of DME;Decreased mobility;Decreased range of motion       PT Treatment Interventions DME instruction;Gait training;Functional mobility training;Therapeutic activities;Patient/family education;Therapeutic exercise    PT Goals (Current goals can be found in the Care Plan section)  Acute Rehab PT Goals Patient Stated Goal: get back to golfing PT Goal Formulation: With patient Time For Goal Achievement: 11/06/23 Potential to Achieve Goals: Good    Frequency 7X/week     Co-evaluation               AM-PAC PT "6 Clicks" Mobility  Outcome Measure Help needed turning from your back to your side while in a flat bed without using bedrails?: A Little Help needed moving from lying on your back to sitting on the side of a flat bed without using bedrails?: A Little Help needed moving to and from a bed to a chair (including a wheelchair)?: A Little Help needed standing up from a chair using your arms (e.g., wheelchair or bedside chair)?: A Little Help needed to walk in hospital room?: A Little Help needed climbing 3-5 steps with a railing? : A Lot 6 Click Score: 17    End of Session Equipment Utilized During Treatment: Gait belt Activity Tolerance: Patient tolerated treatment well Patient left: with call bell/phone within reach;in chair;with chair alarm set Nurse Communication: Mobility status PT Visit Diagnosis: Other abnormalities of gait and mobility (R26.89)    Time: 1610-9604 PT Time Calculation (min) (ACUTE ONLY): 27 min   Charges:   PT Evaluation $PT Eval Low Complexity: 1 Low PT Treatments $Gait Training: 8-22 mins PT General Charges $$ ACUTE PT VISIT: 1 Visit         Jvon Meroney, PT  Acute Rehab Dept [facility]) [phone]  10/30/2023   [facility] [place] 10/30/2023, 4:58 PM

## 2023-10-30 NOTE — Interval H&P Note (Signed)
 History and Physical Interval Note:  10/30/2023 6:33 AM  [name] [name]  has presented today for surgery, with the diagnosis of left knee osteoarthritis.  The various methods of treatment have been discussed with the patient and family. After consideration of risks, benefits and other options for treatment, the patient has consented to  Procedure(s): TOTAL KNEE ARTHROPLASTY (Left) as a surgical intervention.  The patient's history has been reviewed, patient examined, no change in status, stable for surgery.  I have reviewed the patient's chart and labs.  Questions were answered to the patient's satisfaction.     [name]

## 2023-10-30 NOTE — Progress Notes (Signed)
 Orthopedic Tech Progress Note Patient Details:  [name] [name] [birth year] [id number]  CPM Left Knee CPM Left Knee: Off Left Knee Flexion (Degrees): 40 Left Knee Extension (Degrees): 10  Post Interventions Patient Tolerated: Well  Darleen Crocker 10/30/2023, 12:52 PM

## 2023-10-30 NOTE — Anesthesia Procedure Notes (Signed)
 Anesthesia Regional Block: Adductor canal block   Pre-Anesthetic Checklist: , timeout performed,  Correct Patient, Correct Site, Correct Laterality,  Correct Procedure, Correct Position, site marked,  Risks and benefits discussed,  Surgical consent,  Pre-op evaluation,  At surgeon's request and post-op pain management  Laterality: Left  Prep: chloraprep       Needles:  Injection technique: Single-shot  Needle Type: Echogenic Stimulator Needle     Needle Length: 9cm  Needle Gauge: 21     Additional Needles:   Procedures:,,,, ultrasound used (permanent image in chart),,    Narrative:  Start time: 10/30/2023 6:50 AM End time: 10/30/2023 6:55 AM Injection made incrementally with aspirations every 5 mL.  Performed by: Personally  Anesthesiologist:  Nation, MD  Additional Notes: Discussed risks and benefits of the nerve block in detail, including but not limited vascular injury, permanent nerve damage and infection.   Patient tolerated the procedure well. Local anesthetic introduced in an incremental fashion under minimal resistance after negative aspirations. No paresthesias were elicited. After completion of the procedure, no acute issues were identified and patient continued to be monitored by RN.

## 2023-10-30 NOTE — Discharge Instructions (Addendum)
 Ollen Gross, MD Total Joint Specialist [facility] [place] [address] [place], [place] [address] [phone]  TOTAL KNEE REPLACEMENT POSTOPERATIVE DIRECTIONS    Knee Rehabilitation, Guidelines Following Surgery  Results after knee surgery are often greatly improved when you follow the exercise, range of motion and muscle strengthening exercises prescribed by your doctor. Safety measures are also important to protect the knee from further injury. If any of these exercises cause you to have increased pain or swelling in your knee joint, decrease the amount until you are comfortable again and slowly increase them. If you have problems or questions, call your caregiver or physical therapist for advice.   BLOOD CLOT PREVENTION Take a 10 mg Xarelto once a day for three weeks following surgery. Then resume 81 mg Aspirin once a day. You may resume your vitamins/supplements once you have discontinued the Xarelto. Do not take any NSAIDs (Advil, Aleve, Ibuprofen, Meloxicam, etc.) until you have discontinued the Xarelto.   HOME CARE INSTRUCTIONS  Remove items at home which could result in a fall. This includes throw rugs or furniture in walking pathways.  ICE to the affected knee as much as tolerated. Icing helps control swelling. If the swelling is well controlled you will be more comfortable and rehab easier. Continue to use ice on the knee for pain and swelling from surgery. You may notice swelling that will progress down to the foot and ankle. This is normal after surgery. Elevate the leg when you are not up walking on it.    Continue to use the breathing machine which will help keep your temperature down. It is common for your temperature to cycle up and down following surgery, especially at night when you are not up moving around and exerting yourself. The breathing machine keeps your lungs expanded and your temperature down. Do not place pillow under the operative  knee, focus on keeping the knee straight while resting   DIET You may resume your previous home diet once you are discharged from the hospital.  DRESSING / WOUND CARE / SHOWERING Keep your bulky bandage on for 2 days. On the third post-operative day you may remove the Ace bandage and gauze. There is a waterproof adhesive bandage on your skin which will stay in place until your first follow-up appointment. Once you remove this you will not need to place another bandage You may begin showering 3 days following surgery, but do not submerge the incision under water.  ACTIVITY For the first 5 days, the key is rest and control of pain and swelling Do your home exercises twice a day starting on post-operative day 3. On the days you go to physical therapy, just do the home exercises once that day. You should rest, ice and elevate the leg for 50 minutes out of every hour. Get up and walk/stretch for 10 minutes per hour. After 5 days you can increase your activity slowly as tolerated. Walk with your walker as instructed. Use the walker until you are comfortable transitioning to a cane. Walk with the cane in the opposite hand of the operative leg. You may discontinue the cane once you are comfortable and walking steadily. Avoid periods of inactivity such as sitting longer than an hour when not asleep. This helps prevent blood clots.  You may discontinue the knee immobilizer once you are able to perform a straight leg raise while lying down. You may resume a sexual relationship in one month or when given the OK by your  doctor.  You may return to work once you are cleared by your doctor.  Do not drive a car for 6 weeks or until released by your surgeon.  Do not drive while taking narcotics.  TED HOSE STOCKINGS Wear the elastic stockings on both legs for three weeks following surgery during the day. You may remove them at night for sleeping.  WEIGHT BEARING Weight bearing as tolerated with assist device  (walker, cane, etc) as directed, use it as long as suggested by your surgeon or therapist, typically at least 4-6 weeks.  POSTOPERATIVE CONSTIPATION PROTOCOL Constipation - defined medically as fewer than three stools per week and severe constipation as less than one stool per week.  One of the most common issues patients have following surgery is constipation.  Even if you have a regular bowel pattern at home, your normal regimen is likely to be disrupted due to multiple reasons following surgery.  Combination of anesthesia, postoperative narcotics, change in appetite and fluid intake all can affect your bowels.  In order to avoid complications following surgery, here are some recommendations in order to help you during your recovery period.  Colace (docusate) - Pick up an over-the-counter form of Colace or another stool softener and take twice a day as long as you are requiring postoperative pain medications.  Take with a full glass of water daily.  If you experience loose stools or diarrhea, hold the colace until you stool forms back up. If your symptoms do not get better within 1 week or if they get worse, check with your doctor. Dulcolax (bisacodyl) - Pick up over-the-counter and take as directed by the product packaging as needed to assist with the movement of your bowels.  Take with a full glass of water.  Use this product as needed if not relieved by Colace only.  MiraLax (polyethylene glycol) - Pick up over-the-counter to have on hand. MiraLax is a solution that will increase the amount of water in your bowels to assist with bowel movements.  Take as directed and can mix with a glass of water, juice, soda, coffee, or tea. Take if you go more than two days without a movement. Do not use MiraLax more than once per day. Call your doctor if you are still constipated or irregular after using this medication for 7 days in a row.  If you continue to have problems with postoperative constipation, please  contact the office for further assistance and recommendations.  If you experience "the worst abdominal pain ever" or develop nausea or vomiting, please contact the office immediatly for further recommendations for treatment.  ITCHING If you experience itching with your medications, try taking only a single pain pill, or even half a pain pill at a time.  You can also use Benadryl over the counter for itching or also to help with sleep.   MEDICATIONS See your medication summary on the "After Visit Summary" that the nursing staff will review with you prior to discharge.  You may have some home medications which will be placed on hold until you complete the course of blood thinner medication.  It is important for you to complete the blood thinner medication as prescribed by your surgeon.  Continue your approved medications as instructed at time of discharge.  PRECAUTIONS If you experience chest pain or shortness of breath - call 911 immediately for transfer to the hospital emergency department.  If you develop a fever greater that 101 F, purulent drainage from wound, increased redness or  drainage from wound, foul odor from the wound/dressing, or calf pain - CONTACT YOUR SURGEON.                                                   FOLLOW-UP APPOINTMENTS Make sure you keep all of your appointments after your operation with your surgeon and caregivers. You should call the office at the above phone number and make an appointment for approximately two weeks after the date of your surgery or on the date instructed by your surgeon outlined in the "After Visit Summary".  RANGE OF MOTION AND STRENGTHENING EXERCISES  Rehabilitation of the knee is important following a knee injury or an operation. After just a few days of immobilization, the muscles of the thigh which control the knee become weakened and shrink (atrophy). Knee exercises are designed to build up the tone and strength of the thigh muscles and to  improve knee motion. Often times heat used for twenty to thirty minutes before working out will loosen up your tissues and help with improving the range of motion but do not use heat for the first two weeks following surgery. These exercises can be done on a training (exercise) mat, on the floor, on a table or on a bed. Use what ever works the best and is most comfortable for you Knee exercises include:  Leg Lifts - While your knee is still immobilized in a splint or cast, you can do straight leg raises. Lift the leg to 60 degrees, hold for 3 sec, and slowly lower the leg. Repeat 10-20 times 2-3 times daily. Perform this exercise against resistance later as your knee gets better.  Quad and Hamstring Sets - Tighten up the muscle on the front of the thigh (Quad) and hold for 5-10 sec. Repeat this 10-20 times hourly. Hamstring sets are done by pushing the foot backward against an object and holding for 5-10 sec. Repeat as with quad sets.  Leg Slides: Lying on your back, slowly slide your foot toward your buttocks, bending your knee up off the floor (only go as far as is comfortable). Then slowly slide your foot back down until your leg is flat on the floor again. Angel Wings: Lying on your back spread your legs to the side as far apart as you can without causing discomfort.  A rehabilitation program following serious knee injuries can speed recovery and prevent re-injury in the future due to weakened muscles. Contact your doctor or a physical therapist for more information on knee rehabilitation.   POST-OPERATIVE OPIOID TAPER INSTRUCTIONS: It is important to wean off of your opioid medication as soon as possible. If you do not need pain medication after your surgery it is ok to stop day one. Opioids include: Codeine, Hydrocodone(Norco, Vicodin), Oxycodone(Percocet, oxycontin) and hydromorphone amongst others.  Long term and even short term use of opiods can cause: Increased pain  response Dependence Constipation Depression Respiratory depression And more.  Withdrawal symptoms can include Flu like symptoms Nausea, vomiting And more Techniques to manage these symptoms Hydrate well Eat regular healthy meals Stay active Use relaxation techniques(deep breathing, meditating, yoga) Do Not substitute Alcohol to help with tapering If you have been on opioids for less than two weeks and do not have pain than it is ok to stop all together.  Plan to wean off of opioids This plan  should start within one week post op of your joint replacement. Maintain the same interval or time between taking each dose and first decrease the dose.  Cut the total daily intake of opioids by one tablet each day Next start to increase the time between doses. The last dose that should be eliminated is the evening dose.   IF YOU ARE TRANSFERRED TO A SKILLED REHAB FACILITY If the patient is transferred to a skilled rehab facility following release from the hospital, a list of the current medications will be sent to the facility for the patient to continue.  When discharged from the skilled rehab facility, please have the facility set up the patient's Home Health Physical Therapy prior to being released. Also, the skilled facility will be responsible for providing the patient with their medications at time of release from the facility to include their pain medication, the muscle relaxants, and their blood thinner medication. If the patient is still at the rehab facility at time of the two week follow up appointment, the skilled rehab facility will also need to assist the patient in arranging follow up appointment in our office and any transportation needs.  MAKE SURE YOU:  Understand these instructions.  Get help right away if you are not doing well or get worse.   DENTAL ANTIBIOTICS:  In most cases prophylactic antibiotics for Dental procdeures after total joint surgery are not  necessary.  Exceptions are as follows:  1. History of prior total joint infection  2. Severely immunocompromised (Organ Transplant, cancer chemotherapy, Rheumatoid biologic meds such as Humera)  3. Poorly controlled diabetes (A1C &gt; 8.0, blood glucose over 200)  If you have one of these conditions, contact your surgeon for an antibiotic prescription, prior to your dental procedure.    Pick up stool softner and laxative for home use following surgery while on pain medications. Do not submerge incision under water. Please use good hand washing techniques while changing dressing each day. May shower starting three days after surgery. Please use a clean towel to pat the incision dry following showers. Continue to use ice for pain and swelling after surgery. Do not use any lotions or creams on the incision until instructed by your surgeon.   Information on my medicine - XARELTO (Rivaroxaban)  This medication education was reviewed with me or my healthcare representative as part of my discharge preparation.  The pharmacist that spoke with me during my hospital stay was:    Why was Xarelto prescribed for you? Xarelto was prescribed for you to reduce the risk of blood clots forming after orthopedic surgery. The medical term for these abnormal blood clots is venous thromboembolism (VTE).  What do you need to know about xarelto ? Take your Xarelto ONCE DAILY at the same time every day. You may take it either with or without food.  If you have difficulty swallowing the tablet whole, you may crush it and mix in applesauce just prior to taking your dose.  Take Xarelto exactly as prescribed by your doctor and DO NOT stop taking Xarelto without talking to the doctor who prescribed the medication.  Stopping without other VTE prevention medication to take the place of Xarelto may increase your risk of developing a clot.  After discharge, you should have regular check-up appointments  with your healthcare provider that is prescribing your Xarelto.    What do you do if you miss a dose? If you miss a dose, take it as soon as you remember on the  same day then continue your regularly scheduled once daily regimen the next day. Do not take two doses of Xarelto on the same day.   Important Safety Information A possible side effect of Xarelto is bleeding. You should call your healthcare provider right away if you experience any of the following: Bleeding from an injury or your nose that does not stop. Unusual colored urine (red or dark brown) or unusual colored stools (red or black). Unusual bruising for unknown reasons. A serious fall or if you hit your head (even if there is no bleeding).  Some medicines may interact with Xarelto and might increase your risk of bleeding while on Xarelto. To help avoid this, consult your healthcare provider or pharmacist prior to using any new prescription or non-prescription medications, including herbals, vitamins, non-steroidal anti-inflammatory drugs (NSAIDs) and supplements.  This website has more information on Xarelto: VisitDestination.com.br.

## 2023-10-30 NOTE — Transfer of Care (Signed)
 Immediate Anesthesia Transfer of Care Note  Patient: [name] [name]  Procedure(s) Performed: TOTAL KNEE ARTHROPLASTY (Left: Knee)  Patient Location: PACU  Anesthesia Type:Regional and MAC combined with regional for post-op pain  Level of Consciousness: awake, alert , oriented, and patient cooperative  Airway & Oxygen Therapy: Patient Spontanous Breathing and Patient connected to face mask oxygen  Post-op Assessment: Report given to RN and Post -op Vital signs reviewed and stable  Post vital signs: Reviewed and stable  Last Vitals:  Vitals Value Taken Time  BP 91/44 10/30/23 0855  Temp    Pulse 56 10/30/23 0857  Resp 19 10/30/23 0857  SpO2 93 % 10/30/23 0857  Vitals shown include unfiled device data.  Last Pain:  Vitals:   10/30/23 0616  TempSrc: Oral  PainSc:          Complications: No notable events documented.

## 2023-10-30 NOTE — Anesthesia Postprocedure Evaluation (Signed)
 Anesthesia Post Note  Patient: [name] [name]  Procedure(s) Performed: TOTAL KNEE ARTHROPLASTY (Left: Knee)     Patient location during evaluation: PACU Anesthesia Type: Regional and Spinal Level of consciousness: oriented and awake and alert Pain management: pain level controlled Vital Signs Assessment: post-procedure vital signs reviewed and stable Respiratory status: spontaneous breathing, respiratory function stable and patient connected to nasal cannula oxygen Cardiovascular status: blood pressure returned to baseline and stable Postop Assessment: no headache, no backache and no apparent nausea or vomiting Anesthetic complications: no   No notable events documented.  Last Vitals:  Vitals:   10/30/23 0940 10/30/23 0945  BP: 96/62 112/72  Pulse: (!) 56 (!) 53  Resp: 18 17  Temp:    SpO2: 95% 98%    Last Pain:  Vitals:   10/30/23 0945  TempSrc:   PainSc: 0-No pain                  [place]

## 2023-10-30 NOTE — Anesthesia Procedure Notes (Signed)
 Spinal  Patient location during procedure: OR Start time: 10/30/2023 7:15 AM End time: 10/30/2023 7:18 AM Reason for block: surgical anesthesia Staffing Performed: anesthesiologist  Anesthesiologist: Irwin Nation, MD Performed by: Daisy Nation, MD Authorized by: Gotebo Nation, MD   Preanesthetic Checklist Completed: patient identified, IV checked, site marked, risks and benefits discussed, surgical consent, monitors and equipment checked, pre-op evaluation and timeout performed Spinal Block Patient position: sitting Prep: DuraPrep Patient monitoring: heart rate, cardiac monitor, continuous pulse ox and blood pressure Approach: midline Location: L4-5 Needle Needle type: Pencan  Needle gauge: 24 G Assessment Sensory level: T6 Additional Notes Functioning IV was confirmed and monitors were applied. Sterile prep and drape, including hand hygiene and sterile gloves were used. The patient was positioned and the spine was prepped. The skin was anesthetized with lidocaine.  Free flow of clear CSF was obtained prior to injecting local anesthetic into the CSF.  The spinal needle aspirated freely following injection.  The needle was carefully withdrawn.  The patient tolerated the procedure well.

## 2023-10-30 NOTE — Progress Notes (Signed)
 Orthopedic Tech Progress Note Patient Details:  [name] [name] 1941/06/21 [id number]  CPM Left Knee CPM Left Knee: On Left Knee Flexion (Degrees): 40 Left Knee Extension (Degrees): 10  Post Interventions Patient Tolerated: Well  Darleen Crocker 10/30/2023, 9:21 AM

## 2023-10-30 NOTE — Op Note (Signed)
 OPERATIVE REPORT-TOTAL KNEE ARTHROPLASTY   Pre-operative diagnosis- Osteoarthritis  Left knee(s)  Post-operative diagnosis- Osteoarthritis Left knee(s)  Procedure-  Left  Total Knee Arthroplasty  Surgeon- [name] Rankin. Cornelius Marullo, MD  Assistant- Weston Brass, PA-C   Anesthesia-   Adductor canal block and spinal  EBL-50 mL   Drains None  Tourniquet time-  Total Tourniquet Time Documented: Thigh (Left) - 41 minutes Total: Thigh (Left) - 41 minutes     Complications- None  Condition-PACU - hemodynamically stable.   Brief Clinical Note   [name] [name] is a 83 y.o. year old male with end stage OA of his left knee with progressively worsening pain and dysfunction. He has constant pain, with activity and at rest and significant functional deficits with difficulties even with ADLs. He has had extensive non-op management including analgesics, injections of cortisone and viscosupplements, and home exercise program, but remains in significant pain with significant dysfunction. Radiographs show bone on bone arthritis medial and patellofemoral. He presents now for left Total Knee Arthroplasty.     Procedure in detail---   The patient is brought into the operating room and positioned supine on the operating table. After successful administration of  Adductor canal block and spinal,   a tourniquet is placed high on the  Left thigh(s) and the lower extremity is prepped and draped in the usual sterile fashion. Time out is performed by the operating team and then the  Left lower extremity is wrapped in Esmarch, knee flexed and the tourniquet inflated to 300 mmHg.       A midline incision is made with a ten blade through the subcutaneous tissue to the level of the extensor mechanism. A fresh blade is used to make a medial parapatellar arthrotomy. Soft tissue over the proximal medial tibia is subperiosteally elevated to the joint line with a knife and into the semimembranosus bursa with a Cobb elevator.  Soft tissue over the proximal lateral tibia is elevated with attention being paid to avoiding the patellar tendon on the tibial tubercle. The patella is everted, knee flexed 90 degrees and the ACL and PCL are removed. Findings are bone on bone medial and patellofemoral with large global osteophytes He also had 2 ossicles embedded  in the distal patellar tendon which were carefully removed without damaging the tendon.      The drill is used to create a starting hole in the distal femur and the canal is thoroughly irrigated with sterile saline to remove the fatty contents. The 5 degree Left  valgus alignment guide is placed into the femoral canal and the distal femoral cutting block is pinned to remove 10 mm off the distal femur. Resection is made with an oscillating saw.      The tibia is subluxed forward and the menisci are removed. The extramedullary alignment guide is placed referencing proximally at the medial aspect of the tibial tubercle and distally along the second metatarsal axis and tibial crest. The block is pinned to remove 2mm off the more deficient medial  side. Resection is made with an oscillating saw. Size 6is the most appropriate size for the tibia and the proximal tibia is prepared with the modular drill and keel punch for that size.      The femoral sizing guide is placed and size 6 is most appropriate. Rotation is marked off the epicondylar axis and confirmed by creating a rectangular flexion gap at 90 degrees. The size 6 cutting block is pinned in this rotation and the anterior, posterior and  chamfer cuts are made with the oscillating saw. The intercondylar block is then placed and that cut is made.      Trial size 6 tibial component, trial size 6 posterior stabilized femur and a 8  mm posterior stabilized rotating platform insert trial is placed. Full extension is achieved with excellent varus/valgus and anterior/posterior balance throughout full range of motion. The patella is everted and  thickness measured to be 24  mm. Free hand resection is taken to 14 mm, a 28 template is placed, lug holes are drilled, trial patella is placed, and it tracks normally. Osteophytes are removed off the posterior femur with the trial in place. All trials are removed and the cut bone surfaces prepared with pulsatile lavage. Cement is mixed and once ready for implantation, the size 6 tibial implant, size  6 posterior stabilized femoral component, and the size 38 patella are cemented in place and the patella is held with the clamp. The trial insert is placed and the knee held in full extension. The Exparel (20 ml mixed with 60 ml saline) is injected into the extensor mechanism, posterior capsule, medial and lateral gutters and subcutaneous tissues.  All extruded cement is removed and once the cement is hard the permanent 8 mm posterior stabilized rotating platform insert is placed into the tibial tray.      The wound is copiously irrigated with saline solution and the extensor mechanism closed with # 0 Stratofix suture. The tourniquet is released for a total tourniquet time of 41  minutes. Flexion against gravity is 140 degrees and the patella tracks normally. Subcutaneous tissue is closed with 2.0 vicryl and subcuticular with running 4.0 Monocryl. The incision is cleaned and dried and steri-strips and a bulky sterile dressing are applied. The limb is placed into a knee immobilizer and the patient is awakened and transported to recovery in stable condition.      Please note that a surgical assistant was a medical necessity for this procedure in order to perform it in a safe and expeditious manner. Surgical assistant was necessary to retract the ligaments and vital neurovascular structures to prevent injury to them and also necessary for proper positioning of the limb to allow for anatomic placement of the prosthesis.   [name] Rankin [name] Wittke, MD    10/30/2023, 8:41 AM

## 2023-10-31 ENCOUNTER — Encounter (HOSPITAL_COMMUNITY): Payer: Self-pay | Admitting: Orthopedic Surgery

## 2023-10-31 DIAGNOSIS — Z8249 Family history of ischemic heart disease and other diseases of the circulatory system: Secondary | ICD-10-CM | POA: Diagnosis not present

## 2023-10-31 DIAGNOSIS — M1712 Unilateral primary osteoarthritis, left knee: Secondary | ICD-10-CM | POA: Diagnosis present

## 2023-10-31 DIAGNOSIS — H905 Unspecified sensorineural hearing loss: Secondary | ICD-10-CM | POA: Diagnosis present

## 2023-10-31 DIAGNOSIS — Z7982 Long term (current) use of aspirin: Secondary | ICD-10-CM | POA: Diagnosis not present

## 2023-10-31 DIAGNOSIS — Z79899 Other long term (current) drug therapy: Secondary | ICD-10-CM | POA: Diagnosis not present

## 2023-10-31 DIAGNOSIS — I2510 Atherosclerotic heart disease of native coronary artery without angina pectoris: Secondary | ICD-10-CM | POA: Diagnosis present

## 2023-10-31 DIAGNOSIS — Z8042 Family history of malignant neoplasm of prostate: Secondary | ICD-10-CM | POA: Diagnosis not present

## 2023-10-31 DIAGNOSIS — Z7901 Long term (current) use of anticoagulants: Secondary | ICD-10-CM | POA: Diagnosis not present

## 2023-10-31 DIAGNOSIS — Z8546 Personal history of malignant neoplasm of prostate: Secondary | ICD-10-CM | POA: Diagnosis not present

## 2023-10-31 DIAGNOSIS — Z923 Personal history of irradiation: Secondary | ICD-10-CM | POA: Diagnosis not present

## 2023-10-31 DIAGNOSIS — I480 Paroxysmal atrial fibrillation: Secondary | ICD-10-CM | POA: Diagnosis present

## 2023-10-31 LAB — BASIC METABOLIC PANEL
Anion gap: 8 (ref 5–15)
BUN: 11 mg/dL (ref 8–23)
CO2: 26 mmol/L (ref 22–32)
Calcium: 8.8 mg/dL — ABNORMAL LOW (ref 8.9–10.3)
Chloride: 99 mmol/L (ref 98–111)
Creatinine, Ser: 0.71 mg/dL (ref 0.61–1.24)
GFR, Estimated: >60 mL/min (ref >60)
Glucose, Bld: 138 mg/dL — ABNORMAL HIGH (ref 70–99)
Potassium: 4.3 mmol/L (ref 3.5–5.1)
Sodium: 133 mmol/L — ABNORMAL LOW (ref 135–145)

## 2023-10-31 LAB — CBC
HCT: 32.7 % — ABNORMAL LOW (ref 39.0–52.0)
Hemoglobin: 10.8 g/dL — ABNORMAL LOW (ref 13.0–17.0)
MCH: 30.4 pg (ref 26.0–34.0)
MCHC: 33 g/dL (ref 30.0–36.0)
MCV: 92.1 fL (ref 80.0–100.0)
Platelets: 151 10*3/uL (ref 150–400)
RBC: 3.55 MIL/uL — ABNORMAL LOW (ref 4.22–5.81)
RDW: 13.2 % (ref 11.5–15.5)
WBC: 9.6 10*3/uL (ref 4.0–10.5)
nRBC: 0 % (ref 0.0–0.2)

## 2023-10-31 MED ORDER — HYDROMORPHONE HCL 2 MG PO TABS
2.0000 mg | ORAL_TABLET | ORAL | Status: DC | PRN
  Administered 2023-10-31 – 2023-11-02 (×9): 4 mg via ORAL
  Filled 2023-10-31: qty 2
  Filled 2023-10-31: qty 1
  Filled 2023-10-31 (×3): qty 2
  Filled 2023-10-31: qty 1
  Filled 2023-10-31 (×5): qty 2

## 2023-10-31 NOTE — Progress Notes (Signed)
 Unable to control patient pain despite the use of prn  pain medication. Patient rates his pain level 7 -10 through out the evening shift. Ice applied as ordered. Patient decline to get in the bed preferred the recliner. Oxygen applied 2/L due to patient O2 Sat's  dropping. Discuss with patient the order to remove foley, Patient stated that the foley needs to stay in place because of prostate cancer he has to wear pads. Patient then asked, "when would the doctor been in."

## 2023-10-31 NOTE — Progress Notes (Signed)
 Physical Therapy Treatment Patient Details Name: [name] [name] MRN: [id number] DOB: 31-May-1941 Today's Date: 10/31/2023   History of Present Illness 83 yo male s/p L TKA on 10/30/23. PMH: lumbar laminectomy, prostate CA s/p prostatectomy, vocal cord paralysis, afib-s/p ablation, CAD, thymectomy, resection mediastinal mass.    PT Comments  Pt reports incr pain today, agreeable to therapy as able. Amb short distance in room, limited by pain with WBing. Returned to recliner and performed APs and QS, flexion deferred at this time d/t pain. Ice to knee and RN notified of pt request for pain meds. Continue PT in acute setting   If plan is discharge home, recommend the following: Help with stairs or ramp for entrance;Assistance with cooking/housework;Assist for transportation   Can travel by private vehicle        Equipment Recommendations  None recommended by PT    Recommendations for Other Services       Precautions / Restrictions Precautions Precautions: Knee Recall of Precautions/Restrictions: Intact Required Braces or Orthoses: Knee Immobilizer - Left Knee Immobilizer - Left: Discontinue once straight leg raise with < 10 degree lag Restrictions Weight Bearing Restrictions Per Provider Order: No LLE Weight Bearing Per Provider Order: Weight bearing as tolerated     Mobility  Bed Mobility               General bed mobility comments:  (pt in recliner)    Transfers Overall transfer level: Needs assistance Equipment used: Rolling walker (2 wheels) Transfers: Sit to/from Stand Sit to Stand: Min assist           General transfer comment: cues for proper hand placement and LLE position    Ambulation/Gait Ambulation/Gait assistance: Min assist Gait Distance (Feet): 15 Feet Assistive device: Rolling walker (2 wheels) Gait Pattern/deviations: Step-to pattern, Antalgic       General Gait Details: cues for sequence, RW position and use of UEs to offload LLE for  improved pain control   Stairs             Wheelchair Mobility     Tilt Bed    Modified Rankin (Stroke Patients Only)       Balance                                            Communication Communication Communication: No apparent difficulties  Cognition Arousal: Alert Behavior During Therapy: WFL for tasks assessed/performed   PT - Cognitive impairments: No apparent impairments                                Cueing Cueing Techniques: Verbal cues  Exercises Total Joint Exercises Ankle Circles/Pumps: AROM, Both, 10 reps Quad Sets: AROM, Both, 10 reps, Limitations Quad Sets Limitations: pain, flexion also deferred d/t elevated pain    General Comments        Pertinent Vitals/Pain Pain Assessment Pain Assessment: Faces Faces Pain Scale: Hurts whole lot Pain Location: L knee Pain Descriptors / Indicators: Aching, Sore Pain Intervention(s): Limited activity within patient's tolerance, Monitored during session, Repositioned, Ice applied    Home Living                          Prior Function            PT Goals (current goals  can now be found in the care plan section) Acute Rehab PT Goals Patient Stated Goal: get back to golfing PT Goal Formulation: With patient Time For Goal Achievement: 11/06/23 Potential to Achieve Goals: Good Progress towards PT goals: Progressing toward goals    Frequency    7X/week      PT Plan      Co-evaluation              AM-PAC PT "6 Clicks" Mobility   Outcome Measure  Help needed turning from your back to your side while in a flat bed without using bedrails?: A Little Help needed moving from lying on your back to sitting on the side of a flat bed without using bedrails?: A Little Help needed moving to and from a bed to a chair (including a wheelchair)?: A Little Help needed standing up from a chair using your arms (e.g., wheelchair or bedside chair)?: A  Little Help needed to walk in hospital room?: A Little Help needed climbing 3-5 steps with a railing? : A Lot 6 Click Score: 17    End of Session Equipment Utilized During Treatment: Gait belt Activity Tolerance: Patient tolerated treatment well Patient left: with call bell/phone within reach;in chair;with chair alarm set Nurse Communication: Mobility status PT Visit Diagnosis: Other abnormalities of gait and mobility (R26.89)     Time: 1610-9604 PT Time Calculation (min) (ACUTE ONLY): 27 min  Charges:    $Gait Training: 23-37 mins PT General Charges $$ ACUTE PT VISIT: 1 Visit                     Ronon Ferger, PT  Acute Rehab Dept ([facility]) [phone]  10/31/2023    [facility] 10/31/2023, 1:30 PM

## 2023-10-31 NOTE — Progress Notes (Signed)
 Physical Therapy Treatment Patient Details Name: [name] [name] MRN: [id number] DOB: 13-May-1941 Today's Date: 10/31/2023   History of Present Illness 83 yo male s/p L TKA on 10/30/23. PMH: lumbar laminectomy, prostate CA s/p prostatectomy, vocal cord paralysis, afib-s/p ablation, CAD, thymectomy, resection mediastinal mass.    PT Comments  Pt is progressing slowly but steadily. Amb 29' with RW and min to CGA, requiring extra time d/t pain/fatigue.  SpO2=85% on RA after amb, replaced at 2L, incr to 93% with proper breathing and use of IS. Friend who is retired [facility] Charity fundraiser present on PT departure.  Pt states he cannot go home tomorrow bc spouse is sick. Will continue efforts to progress mobility.     If plan is discharge home, recommend the following: Help with stairs or ramp for entrance;Assistance with cooking/housework;Assist for transportation   Can travel by private vehicle        Equipment Recommendations  None recommended by PT    Recommendations for Other Services       Precautions / Restrictions Precautions Precautions: Knee Recall of Precautions/Restrictions: Intact Precaution/Restrictions Comments: encouraged terminal knee extension Required Braces or Orthoses: Knee Immobilizer - Left Knee Immobilizer - Left: Discontinue once straight leg raise with < 10 degree lag Restrictions Weight Bearing Restrictions Per Provider Order: No LLE Weight Bearing Per Provider Order: Weight bearing as tolerated     Mobility  Bed Mobility               General bed mobility comments:  (pt in recliner)    Transfers Overall transfer level: Needs assistance Equipment used: Rolling walker (2 wheels) Transfers: Sit to/from Stand Sit to Stand: Min assist, Contact guard assist           General transfer comment: cues for proper hand placement and LLE position    Ambulation/Gait Ambulation/Gait assistance: Min assist, Contact guard assist Gait Distance (Feet): 45 Feet Assistive  device: Rolling walker (2 wheels) Gait Pattern/deviations: Step-to pattern, Antalgic, Decreased weight shift to left Gait velocity: significantly decr     General Gait Details: incr time; cues for sequence, RW position and use of UEs to offload LLE for improved pain control. wt shift to L improving slightly with incr distance; SpO2=85% on RW after amb, replaced at 2L, incr to 93% with proper breathing and use of IS   Stairs             Wheelchair Mobility     Tilt Bed    Modified Rankin (Stroke Patients Only)       Balance                                            Communication Communication Communication: No apparent difficulties  Cognition Arousal: Alert Behavior During Therapy: WFL for tasks assessed/performed   PT - Cognitive impairments: No apparent impairments                                Cueing Cueing Techniques: Verbal cues  Exercises Total Joint Exercises Ankle Circles/Pumps: AROM, Both, 10 reps Quad Sets: AROM, Both, Limitations, 5 reps Quad Sets Limitations: pain, flexion also deferred d/t elevated pain Goniometric ROM: ~ 15 to 80 degrees knee flexion    General Comments        Pertinent Vitals/Pain Pain Assessment Pain Assessment: Faces Faces Pain Scale:  Hurts whole lot Pain Location: L knee Pain Descriptors / Indicators: Aching, Sore, Grimacing, Guarding Pain Intervention(s): Limited activity within patient's tolerance, Monitored during session, Premedicated before session, Repositioned, Ice applied    Home Living                          Prior Function            PT Goals (current goals can now be found in the care plan section) Acute Rehab PT Goals Patient Stated Goal: get back to golfing PT Goal Formulation: With patient Time For Goal Achievement: 11/06/23 Potential to Achieve Goals: Good Progress towards PT goals: Progressing toward goals    Frequency    7X/week      PT Plan       Co-evaluation              AM-PAC PT "6 Clicks" Mobility   Outcome Measure  Help needed turning from your back to your side while in a flat bed without using bedrails?: A Little Help needed moving from lying on your back to sitting on the side of a flat bed without using bedrails?: A Little Help needed moving to and from a bed to a chair (including a wheelchair)?: A Little Help needed standing up from a chair using your arms (e.g., wheelchair or bedside chair)?: A Little Help needed to walk in hospital room?: A Little Help needed climbing 3-5 steps with a railing? : A Lot 6 Click Score: 17    End of Session Equipment Utilized During Treatment: Gait belt Activity Tolerance: Patient tolerated treatment well Patient left: with call bell/phone within reach;in chair;with chair alarm set Nurse Communication: Mobility status PT Visit Diagnosis: Other abnormalities of gait and mobility (R26.89)     Time: 4098-1191 PT Time Calculation (min) (ACUTE ONLY): 43 min  Charges:    $Gait Training: 23-37 mins $Therapeutic Exercise: 8-22 mins PT General Charges $$ ACUTE PT VISIT: 1 Visit                     Delice Bison, PT  Acute Rehab Dept [facility]) [phone]  10/31/2023    [facility] 10/31/2023, 3:55 PM

## 2023-10-31 NOTE — TOC Transition Note (Signed)
 Transition of Care [facility]) - Discharge Note   Patient Details  Name: [name] [name] MRN: [id number] Date of Birth: 19-Apr-1941  [facility] CM/SW Contact:  Amada Jupiter, LCSW Phone Number: 10/31/2023, 10:29 AM   Clinical Narrative:     Met with pt who confirms he has needed DME in the home.  OPPT already arranged with [facility].  No further TOC needs.  Final next level of care: OP Rehab Barriers to Discharge: No Barriers Identified   Patient Goals and CMS Choice Patient states their goals for this hospitalization and ongoing recovery are:: return home          Discharge Placement                       Discharge Plan and Services Additional resources added to the After Visit Summary for                  DME Arranged: N/A DME Agency: NA                  Social Drivers of Health (SDOH) Interventions SDOH Screenings   Food Insecurity: No Food Insecurity (10/30/2023)  Housing: Low Risk  (10/30/2023)  Transportation Needs: No Transportation Needs (10/30/2023)  Utilities: Not At Risk (10/30/2023)  Social Connections: Unknown (10/30/2023)  Tobacco Use: Low Risk  (10/30/2023)     Readmission Risk Interventions     No data to display

## 2023-10-31 NOTE — Progress Notes (Signed)
 Subjective: 1 Day Post-Op Procedure(s) (LRB): TOTAL KNEE ARTHROPLASTY (Left) Patient reports pain as moderate.   Patient seen in rounds by Dr. Lequita Halt. Patient is having issues with pain control, switching PO oxycodone to dilaudid.  We will continue therapy today.  Objective: Vital signs in last 24 hours: Temp:  [96.7 F (35.9 C)-98.5 F (36.9 C)] 98.5 F (36.9 C) (02/18 0525) Pulse Rate:  [52-66] 63 (02/18 0525) Resp:  [14-18] 18 (02/18 0525) BP: (80-125)/(44-72) 125/62 (02/18 0525) SpO2:  [91 %-100 %] 100 % (02/18 0525)  Intake/Output from previous day:  Intake/Output Summary (Last 24 hours) at 10/31/2023 0843 Last data filed at 10/31/2023 0600 Gross per 24 hour  Intake 2370.77 ml  Output 2855 ml  Net -484.23 ml     Intake/Output this shift: No intake/output data recorded.  Labs: Recent Labs    10/31/23 0342  HGB 10.8*   Recent Labs    10/31/23 0342  WBC 9.6  RBC 3.55*  HCT 32.7*  PLT 151   Recent Labs    10/31/23 0342  NA 133*  K 4.3  CL 99  CO2 26  BUN 11  CREATININE 0.71  GLUCOSE 138*  CALCIUM 8.8*   No results for input(s): "LABPT", "INR" in the last 72 hours.  Exam: General - Patient is Alert and Oriented Extremity - Neurologically intact Neurovascular intact Sensation intact distally Dorsiflexion/Plantar flexion intact Dressing - dressing C/D/I Motor Function - intact, moving foot and toes well on exam.   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    Colon polyps    hyperplastic and tubular adenomatous   Coronary atherosclerosis due to calcified coronary lesion    Diverticulosis    Dyspnea    Dysrhythmia    hx at fib- ablation 8 yrs ago   Erectile dysfunction following radical prostatectomy    Hypercapnic respiratory failure, chronic (HCC)  05/01/2015   Mediastinal mass    per CT CHEST 11/07/14 @ [facility].[name] KitchenANTERIOR...4.2 X 3.3 cm soft tissue   Neuromuscular disorder (HCC)    Prostate cancer (HCC)    prostate cancer   Radiation    for prostate cancer   Radiation proctitis    rectum   Recurrent laryngeal nerve palsy    Respiratory failure (HCC)    s/p phrenic nerve palsy    Thrombocytopenia (HCC)    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   Weight as of this encounter: 80 kg. Advance diet Up with therapy D/C IV fluids   Patient's anticipated LOS is less than 2 midnights, meeting these requirements: - Lives within 1 hour of care - Has a competent adult at home to recover with post-op recover - NO history of  - Chronic pain requiring opiods  - Diabetes  - Heart failure  - Heart attack  - Stroke  - DVT/VTE  - Respiratory Failure/COPD  - Renal failure  - Anemia  - Advanced Liver disease  DVT Prophylaxis - Xarelto Weight bearing as  tolerated. Continue therapy.  Plan is to go Home after hospital stay. Switched pain meds this AM. Will stay until at least tomorrow, just found out that his wife has the flu. Discharge tomorrow versus Thursday depending on weather.  Arther Abbott, PA-C Orthopedic Surgery [phone] 10/31/2023, 8:43 AM

## 2023-11-01 LAB — CBC
HCT: 30 % — ABNORMAL LOW (ref 39.0–52.0)
Hemoglobin: 9.6 g/dL — ABNORMAL LOW (ref 13.0–17.0)
MCH: 30.5 pg (ref 26.0–34.0)
MCHC: 32 g/dL (ref 30.0–36.0)
MCV: 95.2 fL (ref 80.0–100.0)
Platelets: 150 10*3/uL (ref 150–400)
RBC: 3.15 MIL/uL — ABNORMAL LOW (ref 4.22–5.81)
RDW: 13.2 % (ref 11.5–15.5)
WBC: 9.9 10*3/uL (ref 4.0–10.5)
nRBC: 0 % (ref 0.0–0.2)

## 2023-11-01 NOTE — Progress Notes (Signed)
 Physical Therapy Treatment Patient Details Name: [name] [name] MRN: [id number] DOB: 06-04-1941 Today's Date: 11/01/2023   History of Present Illness 83 yo male s/p L TKA on 10/30/23. PMH: lumbar laminectomy, prostate CA s/p prostatectomy, vocal cord paralysis, afib-s/p ablation, CAD, thymectomy, resection mediastinal mass. 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 Pt appears AxO x 3 but present with intermittent confusion/word scramble/loss of thought.  Required repeat directions.  can also be overcome with pain. Pt having a "bad day".  Increased pain.  Pt NOT progressing due to increased pain despite premedication.  General bed mobility comments: Pt stated he slept in recliner.  General transfer comment: cues for proper hand placement and LLE position and increased effort to rise. General Gait Details: decreased amb distance today due to 10/10 pain despite premedicated.  Very limited self WBing through L LE.  Excessive lean on walker.  Very short "hopping" steps showing at most TTWBing.  RA avg 89 - 91% and HR increased to 122 most likely due to effort/pain. Unable to tolerate much TE's.  AAROM knee flexion limited to approx 30 degrees due to pain.   Will see Pt again this afternoon.    If plan is discharge home, recommend the following: Help with stairs or ramp for entrance;Assistance with cooking/housework;Assist for transportation   Can travel by private vehicle        Equipment Recommendations  None recommended by PT    Recommendations for Other Services       Precautions / Restrictions Precautions Precautions: Knee Recall of Precautions/Restrictions: Intact Precaution/Restrictions Comments: no pillow under knee Restrictions Weight Bearing Restrictions Per Provider Order: No LLE Weight Bearing Per Provider Order: Weight bearing as tolerated     Mobility  Bed Mobility               General bed mobility comments:  Pt stated he slept in recliner    Transfers Overall transfer level: Needs assistance Equipment used: Rolling walker (2 wheels) Transfers: Sit to/from Stand Sit to Stand: Min assist, Mod assist           General transfer comment: cues for proper hand placement and LLE position and increased effort to rise.    Ambulation/Gait Ambulation/Gait assistance: Min assist, Mod assist Gait Distance (Feet): 6 Feet Assistive device: Rolling walker (2 wheels) Gait Pattern/deviations: Step-to pattern, Antalgic, Decreased weight shift to left Gait velocity: significantly decr     General Gait Details: decreased amb distance today due to 10/10 pain despite premedicated.  Very limited self WBing through L LE.  Excessive lean on walker.  Very short "hopping" steps showing at most TTWBing.  RA avg 89 - 91% and HR increased to 122 most likely due to effort/pain.   Stairs             Wheelchair Mobility     Tilt Bed    Modified Rankin (Stroke Patients Only)       Balance                                            Communication    Cognition Arousal: Alert, Lethargic Behavior During Therapy: WFL for tasks assessed/performed   PT - Cognitive impairments: Difficult to assess, Memory, Awareness, Problem solving, Safety/Judgement, Sequencing  PT - Cognition Comments: Pt appears AxO x 3 but present with intermittent confusion/word scramble/loss of thought.  Required repeat directions.  can also be overcome with pain.        Cueing Cueing Techniques: Verbal cues  Exercises  Total Knee Replacement TE's following HEP handout 10 reps B LE ankle pumps 05 reps towel squeezes 05 reps knee presses 05 reps heel slides AAROM Unable to tolerate more due to pain Educated on use of gait belt to assist with TE's Followed by ICE     General Comments        Pertinent Vitals/Pain Pain Assessment Pain Assessment: 0-10 Pain Score:  10-Worst pain ever Pain Location: L knee Pain Descriptors / Indicators: Aching, Sore, Grimacing, Guarding, Operative site guarding Pain Intervention(s): Monitored during session, Premedicated before session, Patient requesting pain meds-RN notified, Ice applied    Home Living                          Prior Function            PT Goals (current goals can now be found in the care plan section) Progress towards PT goals: Progressing toward goals    Frequency    7X/week      PT Plan      Co-evaluation              AM-PAC PT "6 Clicks" Mobility   Outcome Measure  Help needed turning from your back to your side while in a flat bed without using bedrails?: A Lot Help needed moving from lying on your back to sitting on the side of a flat bed without using bedrails?: A Lot Help needed moving to and from a bed to a chair (including a wheelchair)?: A Lot Help needed standing up from a chair using your arms (e.g., wheelchair or bedside chair)?: A Lot Help needed to walk in hospital room?: A Lot Help needed climbing 3-5 steps with a railing? : Total 6 Click Score: 11    End of Session Equipment Utilized During Treatment: Gait belt Activity Tolerance: Patient limited by fatigue;Patient limited by pain Patient left: in chair;with call bell/phone within reach;with chair alarm set Nurse Communication: Mobility status PT Visit Diagnosis: Other abnormalities of gait and mobility (R26.89)     Time: 1033-1100 PT Time Calculation (min) (ACUTE ONLY): 27 min  Charges:    $Gait Training: 8-22 mins $Therapeutic Activity: 8-22 mins PT General Charges $$ ACUTE PT VISIT: 1 Visit                     Felecia Shelling  PTA Acute  Rehabilitation Services Office M-F          [phone]

## 2023-11-01 NOTE — Progress Notes (Signed)
   Subjective: 2 Days Post-Op Procedure(s) (LRB): TOTAL KNEE ARTHROPLASTY (Left) Patient seen in rounds for Dr. Lequita Halt. Patient doing well this AM. Feels pain is improving. Denies calf pain. Denies chest pain.  Objective: Vital signs in last 24 hours: Temp:  [98 F (36.7 C)-99.2 F (37.3 C)] 98 F (36.7 C) (02/18 2107) Pulse Rate:  [69-71] 69 (02/18 2107) Resp:  [16-17] 17 (02/18 2107) BP: (110-144)/(60-68) 144/67 (02/18 2107) SpO2:  [86 %-98 %] 96 % (02/18 2107)  Intake/Output from previous day:  Intake/Output Summary (Last 24 hours) at 11/01/2023 0928 Last data filed at 11/01/2023 0600 Gross per 24 hour  Intake 848.97 ml  Output 0 ml  Net 848.97 ml    Intake/Output this shift: No intake/output data recorded.  Labs: Recent Labs    10/31/23 0342 11/01/23 0358  HGB 10.8* 9.6*   Recent Labs    10/31/23 0342 11/01/23 0358  WBC 9.6 9.9  RBC 3.55* 3.15*  HCT 32.7* 30.0*  PLT 151 150   Recent Labs    10/31/23 0342  NA 133*  K 4.3  CL 99  CO2 26  BUN 11  CREATININE 0.71  GLUCOSE 138*  CALCIUM 8.8*   No results for input(s): "LABPT", "INR" in the last 72 hours.  Exam: General - Patient is Alert and Oriented Extremity - Neurologically intact Neurovascular intact Sensation intact distally Dorsiflexion/Plantar flexion intact Dressing/Incision - clean, dry, no drainage Motor Function - intact, moving foot and toes well on exam.  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    Colon polyps    hyperplastic and tubular adenomatous   Coronary atherosclerosis due to calcified coronary lesion    Diverticulosis    Dyspnea    Dysrhythmia    hx at fib- ablation 8 yrs ago   Erectile dysfunction following radical prostatectomy    Hypercapnic respiratory failure, chronic  (HCC) 05/01/2015   Mediastinal mass    per CT CHEST 11/07/14 @ [facility].[name] KitchenANTERIOR...4.2 X 3.3 cm soft tissue   Neuromuscular disorder (HCC)    Prostate cancer (HCC)    prostate cancer   Radiation    for prostate cancer   Radiation proctitis    rectum   Recurrent laryngeal nerve palsy    Respiratory failure (HCC)    s/p phrenic nerve palsy    Thrombocytopenia (HCC)    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   Weight as of this encounter: 80 kg.  DVT Prophylaxis - Xarelto Weight-bearing as tolerated.  Continue physical therapy while admitted to maximize mobility. Will have limited help at home as wife is sick with flu. Discussed discharging home tomorrow pending progress.  Alfonzo Feller, PA-C Orthopedic Surgery 11/01/2023, 9:28 AM

## 2023-11-01 NOTE — Progress Notes (Signed)
 Physical Therapy Treatment Patient Details Name: [name] [name] MRN: [id number] DOB: 17-Aug-1941 Today's Date: 11/01/2023   History of Present Illness 83 yo male s/p L TKA on 10/30/23. PMH: lumbar laminectomy, prostate CA s/p prostatectomy, vocal cord paralysis, afib-s/p ablation, CAD, thymectomy, resection mediastinal mass. 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  Today is a "bad Day" on increased pain and inability to amb in hallway.  General Gait Details: decreased amb distance today due to 10/10 pain despite premedicated.  Very limited self WBing through L LE.  Excessive lean on walker.  Very short "hopping" steps showing at most TTWBing.  Had to move/pull bed up to pt as he was unable to complete any back steps. General bed mobility comments: max assist back to bed due to pain/effort Unable to tolerate any TE's.   Pt has NOT met his mobility goals due to pain and confusion.    If plan is discharge home, recommend the following: Help with stairs or ramp for entrance;Assistance with cooking/housework;Assist for transportation   Can travel by private vehicle        Equipment Recommendations  None recommended by PT    Recommendations for Other Services       Precautions / Restrictions Precautions Precautions: Knee Recall of Precautions/Restrictions: Intact Precaution/Restrictions Comments: no pillow under knee Restrictions Weight Bearing Restrictions Per Provider Order: No LLE Weight Bearing Per Provider Order: Weight bearing as tolerated     Mobility  Bed Mobility Overal bed mobility: Needs Assistance Bed Mobility: Sit to Supine       Sit to supine: Max assist   General bed mobility comments: max assist back to bed due to pain/effort    Transfers Overall transfer level: Needs assistance Equipment used: Rolling walker (2 wheels) Transfers: Sit to/from Stand Sit to Stand: Min assist, Mod assist            General transfer comment: cues for proper hand placement and LLE position and increased effort to rise.    Ambulation/Gait Ambulation/Gait assistance: Min assist, Mod assist Gait Distance (Feet): 2 Feet Assistive device: Rolling walker (2 wheels) Gait Pattern/deviations: Step-to pattern, Antalgic, Decreased weight shift to left Gait velocity: significantly decr     General Gait Details: decreased amb distance today due to 10/10 pain despite premedicated.  Very limited self WBing through L LE.  Excessive lean on walker.  Very short "hopping" steps showing at most TTWBing.  Had to move/pull bed up to pt as he was unable to complete any back steps.   Stairs             Wheelchair Mobility     Tilt Bed    Modified Rankin (Stroke Patients Only)       Balance                                            Communication    Cognition Arousal: Alert, Lethargic Behavior During Therapy: WFL for tasks assessed/performed   PT - Cognitive impairments: Difficult to assess, Memory, Awareness, Problem solving, Safety/Judgement, Sequencing                       PT - Cognition Comments: Pt appears AxO x 3 but present with intermittent confusion/word scramble/loss of thought.  Required repeat directions.  can also be  overcome with pain.        Cueing Cueing Techniques: Verbal cues  Exercises      General Comments        Pertinent Vitals/Pain Pain Assessment Pain Assessment: 0-10 Pain Score: 10-Worst pain ever Pain Location: L knee Pain Descriptors / Indicators: Aching, Sore, Grimacing, Guarding, Operative site guarding Pain Intervention(s): Monitored during session, Premedicated before session, Patient requesting pain meds-RN notified, Ice applied    Home Living                          Prior Function            PT Goals (current goals can now be found in the care plan section) Progress towards PT goals: Progressing toward  goals    Frequency    7X/week      PT Plan      Co-evaluation              AM-PAC PT "6 Clicks" Mobility   Outcome Measure  Help needed turning from your back to your side while in a flat bed without using bedrails?: A Lot Help needed moving from lying on your back to sitting on the side of a flat bed without using bedrails?: A Lot Help needed moving to and from a bed to a chair (including a wheelchair)?: A Lot Help needed standing up from a chair using your arms (e.g., wheelchair or bedside chair)?: A Lot Help needed to walk in hospital room?: A Lot Help needed climbing 3-5 steps with a railing? : Total 6 Click Score: 11    End of Session Equipment Utilized During Treatment: Gait belt Activity Tolerance: Patient limited by fatigue;Patient limited by pain Patient left: in bed;with bed alarm set Nurse Communication: Mobility status PT Visit Diagnosis: Other abnormalities of gait and mobility (R26.89)     Time: 4098-1191 PT Time Calculation (min) (ACUTE ONLY): 24 min  Charges:    $Gait Training: 8-22 mins $Therapeutic Activity: 8-22 mins PT General Charges $$ ACUTE PT VISIT: 1 Visit                     Felecia Shelling  PTA Acute  Rehabilitation Services Office M-F          [phone]

## 2023-11-01 NOTE — Progress Notes (Signed)
 Orthopedic Tech Progress Note Patient Details:  [name] [name] 10/11/1940 [id number]  CPM Left Knee CPM Left Knee: Off Left Knee Flexion (Degrees): 40 Left Knee Extension (Degrees): 10  Post Interventions Patient Tolerated: Unable to use device properly  Kizzie Fantasia 11/01/2023, 4:23 PM

## 2023-11-02 MED ORDER — KETOROLAC TROMETHAMINE 15 MG/ML IJ SOLN
7.5000 mg | Freq: Four times a day (QID) | INTRAMUSCULAR | Status: DC
  Administered 2023-11-02 – 2023-11-03 (×4): 7.5 mg via INTRAVENOUS
  Filled 2023-11-02 (×4): qty 1

## 2023-11-02 NOTE — Progress Notes (Signed)
 Pt consumed about 50% of food brought in by family. Pt stated that scheduled toradol has been effective for pain management. Ice applied to surgical site. Pt currently resting in recliner with call bell in reach. Will continue to monitor

## 2023-11-02 NOTE — Progress Notes (Signed)
 Physical Therapy Treatment Patient Details Name: [name] [name] MRN: [id number] DOB: 1941/06/27 Today's Date: 11/02/2023   History of Present Illness 83 yo male s/p L TKA on 10/30/23. PMH: lumbar laminectomy, prostate CA s/p prostatectomy, vocal cord paralysis, afib-s/p ablation, CAD, thymectomy, resection mediastinal mass. Incont bladder/wears briefs    PT Comments  POD # 3 pm session Cognition and level of alertness slowly improving with weaning of Narcotics.  Pt OOB in recliner.  Assisted with amb.  General transfer comment: slight improved ability to rise with less VC's but still requiring physical Assist. General Gait Details: slight improvement with amb distance of 9 feet then another 6 feet but still very much struggling to tolerate WBing L LE due to pain which he reports a 8/10.  Excessive lean/support on walker.  Max c/o fatigue from effort. Returned to room in recliner. Then returned to room to perform some TE's following HEP handout.  Instructed on proper tech, freq as well as use of ICE.   Pt hopes to D/C to home tomorrow.  Will need to practice stairs.     If plan is discharge home, recommend the following: Help with stairs or ramp for entrance;Assistance with cooking/housework;Assist for transportation   Can travel by private vehicle        Equipment Recommendations  None recommended by PT    Recommendations for Other Services       Precautions / Restrictions Precautions Precautions: Knee Precaution/Restrictions Comments: no pillow under knee Restrictions Weight Bearing Restrictions Per Provider Order: No LLE Weight Bearing Per Provider Order: Weight bearing as tolerated     Mobility  Bed Mobility Overal bed mobility: Needs Assistance Bed Mobility: Supine to Sit     Supine to sit: Mod assist     General bed mobility comments: OOB in recliner    Transfers Overall transfer level: Needs assistance Equipment used: Rolling walker (2 wheels) Transfers: Sit to/from  Stand Sit to Stand: Min assist, Mod assist           General transfer comment: slight improved ability to rise with less VC's but still requiring physical Assist.    Ambulation/Gait Ambulation/Gait assistance: Min assist, Mod assist Gait Distance (Feet): 15 Feet Assistive device: Rolling walker (2 wheels) Gait Pattern/deviations: Step-to pattern, Antalgic, Decreased weight shift to left Gait velocity: significantly decr     General Gait Details: slight improvement with amb distance of 9 feet then another 6 feet but still very much struggling to tolerate WBing L LE due to pain which he reports a 8/10.  Excessive lean/support on walker.  Max c/o fatigue from effort.   Stairs             Wheelchair Mobility     Tilt Bed    Modified Rankin (Stroke Patients Only)       Balance                                            Communication    Cognition Arousal: Alert Behavior During Therapy: WFL for tasks assessed/performed   PT - Cognitive impairments: Memory, Awareness                       PT - Cognition Comments: Improving level of alertness and cognition suspect Narcotis as he is weaning.        Cueing    Exercises  Total  Knee Replacement TE's following HEP handout 10 reps B LE ankle pumps 05 reps towel squeezes 05 reps knee presses 05 reps heel slides  05 reps SLR's 05 reps ABD Educated on use of gait belt to assist with TE's Followed by ICE     General Comments        Pertinent Vitals/Pain Pain Assessment Pain Assessment: 0-10 Pain Score: 8  Pain Location: L knee Pain Descriptors / Indicators: Aching, Sore, Grimacing, Guarding, Operative site guarding Pain Intervention(s): Monitored during session, Premedicated before session, Repositioned, Ice applied    Home Living                          Prior Function            PT Goals (current goals can now be found in the care plan section) Progress  towards PT goals: Progressing toward goals    Frequency    7X/week      PT Plan      Co-evaluation              AM-PAC PT "6 Clicks" Mobility   Outcome Measure  Help needed turning from your back to your side while in a flat bed without using bedrails?: A Lot Help needed moving from lying on your back to sitting on the side of a flat bed without using bedrails?: A Lot Help needed moving to and from a bed to a chair (including a wheelchair)?: A Lot Help needed standing up from a chair using your arms (e.g., wheelchair or bedside chair)?: A Lot Help needed to walk in hospital room?: A Lot Help needed climbing 3-5 steps with a railing? : Total 6 Click Score: 11    End of Session Equipment Utilized During Treatment: Gait belt Activity Tolerance: Patient limited by fatigue;Patient limited by pain Patient left: in chair;with call bell/phone within reach;with family/visitor present Nurse Communication: Mobility status PT Visit Diagnosis: Other abnormalities of gait and mobility (R26.89)     Time: 0981-1914 PT Time Calculation (min) (ACUTE ONLY): 30 min  Charges:    $Gait Training: 8-22 mins $Therapeutic Exercise: 8-22 mins PT General Charges $$ ACUTE PT VISIT: 1 Visit                     Felecia Shelling  PTA Acute  Rehabilitation Services Office M-F          [phone]

## 2023-11-02 NOTE — Progress Notes (Signed)
 Subjective: 3 Days Post-Op Procedure(s) (LRB): TOTAL KNEE ARTHROPLASTY (Left) Patient reports pain as moderate.   Patient seen in rounds for Dr. Lequita Halt. Patient is still endorsing significant pain in the knee that is limiting his ability to rehab. Taking 4 mg of dilaudid Q4, which patient states makes him "foggy" afterwards. He is alert and oriented now, recalling dosage and brand names of medications. No issue with cognition. Plan is to go Home after hospital stay.  Objective: Vital signs in last 24 hours: Temp:  [97.7 F (36.5 C)-99.7 F (37.6 C)] 99 F (37.2 C) (02/20 0556) Pulse Rate:  [78-85] 85 (02/20 0556) Resp:  [17-18] 18 (02/20 0556) BP: (107-132)/(52-69) 125/66 (02/20 0556) SpO2:  [96 %-99 %] 99 % (02/20 0556)  Intake/Output from previous day:  Intake/Output Summary (Last 24 hours) at 11/02/2023 1336 Last data filed at 11/02/2023 1100 Gross per 24 hour  Intake 600 ml  Output 1500 ml  Net -900 ml    Intake/Output this shift: Total I/O In: 360 [P.O.:360] Out: 300 [Urine:300]  Labs: Recent Labs    10/31/23 0342 11/01/23 0358  HGB 10.8* 9.6*   Recent Labs    10/31/23 0342 11/01/23 0358  WBC 9.6 9.9  RBC 3.55* 3.15*  HCT 32.7* 30.0*  PLT 151 150   Recent Labs    10/31/23 0342  NA 133*  K 4.3  CL 99  CO2 26  BUN 11  CREATININE 0.71  GLUCOSE 138*  CALCIUM 8.8*   No results for input(s): "LABPT", "INR" in the last 72 hours.  Exam: General - Patient is Alert and Oriented Extremity - Neurologically intact Neurovascular intact Sensation intact distally Dorsiflexion/Plantar flexion intact Dressing/Incision - clean, dry, no drainage Motor Function - intact, moving foot and toes well on exam.   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    Colon polyps     hyperplastic and tubular adenomatous   Coronary atherosclerosis due to calcified coronary lesion    Diverticulosis    Dyspnea    Dysrhythmia    hx at fib- ablation 8 yrs ago   Erectile dysfunction following radical prostatectomy    Hypercapnic respiratory failure, chronic (HCC) 05/01/2015   Mediastinal mass    per CT CHEST 11/07/14 @ [facility].[name] KitchenANTERIOR...4.2 X 3.3 cm soft tissue   Neuromuscular disorder (HCC)    Prostate cancer (HCC)    prostate cancer   Radiation    for prostate cancer   Radiation proctitis    rectum   Recurrent laryngeal nerve palsy    Respiratory failure (HCC)    s/p phrenic nerve palsy    Thrombocytopenia (HCC)    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   Weight as of this encounter: 80 kg. Up with therapy  DVT Prophylaxis - Xarelto Weight-bearing as tolerated  Ordering toradol to help alleviate pain without increasing narcotic amounts. Decided against switching muscle relaxer, which could add to his grogginess. We discussed discharging tomorrow, he states he is  unsure this is achievable. Will revisit in the AM once he has had the added medication.  Arther Abbott, PA-C Orthopedic Surgery [phone] 11/02/2023, 1:36 PM

## 2023-11-02 NOTE — Progress Notes (Signed)
 Physical Therapy Treatment Patient Details Name: [name] [name] MRN: [id number] DOB: [birth year] Today's Date: 11/02/2023   History of Present Illness 83 yo male s/p L TKA on 10/30/23. PMH: lumbar laminectomy, prostate CA s/p prostatectomy, vocal cord paralysis, afib-s/p ablation, CAD, thymectomy, resection mediastinal mass. Incont bladder/wears briefs    PT Comments  POD # 3 am session Pt is NOT progressing due to PAIN.  Only amb 5 feet.  Pt was given 4mg  Dilaudid and 500mg  Robax prior to session.  POD # 1 pt amb 50 feet.   Assisted OOB required Mod Assist.  Assisted NT with a "wash up" while seated on BSC.  General transfer comment: Repeat cues for proper hand placement and LLE position and increased effort to rise. General Gait Details: decreased amb distance today due to 10/10 pain despite premedicated.  Very limited self WBing through L LE.  Excessive lean on walker.  Very short "hopping" steps showing at most TTWBing. Increased c/o "exhaution" due to excessive effort B UE's on walker.   Cognition Comments: Pt appears AxO x 3 but present with intermittent confusion/word scramble/loss of thought.  Sleepy/groggy. Not interested in eating much. Required repeat directions.   Will see pt again this afternoon.    If plan is discharge home, recommend the following: Help with stairs or ramp for entrance;Assistance with cooking/housework;Assist for transportation   Can travel by private vehicle        Equipment Recommendations  None recommended by PT    Recommendations for Other Services       Precautions / Restrictions Precautions Precautions: Knee Precaution/Restrictions Comments: no pillow under knee Restrictions Weight Bearing Restrictions Per Provider Order: No LLE Weight Bearing Per Provider Order: Weight bearing as tolerated     Mobility  Bed Mobility Overal bed mobility: Needs Assistance Bed Mobility: Supine to Sit     Supine to sit: Mod assist     General bed mobility  comments: required Mod Assist to support L LE and increased time to scoot to EOB.    Transfers Overall transfer level: Needs assistance Equipment used: Rolling walker (2 wheels) Transfers: Sit to/from Stand Sit to Stand: Mod assist           General transfer comment: Repeat cues for proper hand placement and LLE position and increased effort to rise.    Ambulation/Gait Ambulation/Gait assistance: Min assist, Mod assist Gait Distance (Feet): 5 Feet Assistive device: Rolling walker (2 wheels) Gait Pattern/deviations: Step-to pattern, Antalgic, Decreased weight shift to left Gait velocity: significantly decr     General Gait Details: decreased amb distance today due to 10/10 pain despite premedicated.  Very limited self WBing through L LE.  Excessive lean on walker.  Very short "hopping" steps showing at most TTWBing. Increased c/o "exhaution" due to excessive effort B UE's on walker.   Stairs             Wheelchair Mobility     Tilt Bed    Modified Rankin (Stroke Patients Only)       Balance                                            Communication    Cognition Arousal: Alert, Lethargic Behavior During Therapy: WFL for tasks assessed/performed   PT - Cognitive impairments: Difficult to assess, Memory, Awareness, Problem solving, Safety/Judgement, Sequencing  PT - Cognition Comments: Pt appears AxO x 3 but present with intermittent confusion/word scramble/loss of thought.  Required repeat directions. Slight improvement since off OXY.        Cueing    Exercises      General Comments        Pertinent Vitals/Pain Pain Assessment Pain Assessment: 0-10 Pain Score: 10-Worst pain ever Pain Location: L knee Pain Descriptors / Indicators: Aching, Sore, Grimacing, Guarding, Operative site guarding Pain Intervention(s): Premedicated before session, Monitored during session, Repositioned, Ice applied    Home  Living                          Prior Function            PT Goals (current goals can now be found in the care plan section) Progress towards PT goals: Not progressing toward goals - comment (pain)    Frequency    7X/week      PT Plan      Co-evaluation              AM-PAC PT "6 Clicks" Mobility   Outcome Measure  Help needed turning from your back to your side while in a flat bed without using bedrails?: A Lot Help needed moving from lying on your back to sitting on the side of a flat bed without using bedrails?: A Lot Help needed moving to and from a bed to a chair (including a wheelchair)?: A Lot Help needed standing up from a chair using your arms (e.g., wheelchair or bedside chair)?: A Lot Help needed to walk in hospital room?: A Lot Help needed climbing 3-5 steps with a railing? : Total 6 Click Score: 11    End of Session Equipment Utilized During Treatment: Gait belt Activity Tolerance: Patient limited by fatigue;Patient limited by pain Patient left: in chair;with call bell/phone within reach Nurse Communication: Mobility status PT Visit Diagnosis: Other abnormalities of gait and mobility (R26.89)     Time: 4098-1191 PT Time Calculation (min) (ACUTE ONLY): 35 min  Charges:    $Gait Training: 8-22 mins $Therapeutic Activity: 8-22 mins PT General Charges $$ ACUTE PT VISIT: 1 Visit                     Felecia Shelling  PTA Acute  Rehabilitation Services Office M-F          [phone]

## 2023-11-02 NOTE — Plan of Care (Signed)
  Problem: Pain Managment: Goal: General experience of comfort will improve and/or be controlled Outcome: Progressing   Problem: Safety: Goal: Ability to remain free from injury will improve Outcome: Progressing   Problem: Skin Integrity: Goal: Risk for impaired skin integrity will decrease Outcome: Progressing   Problem: Pain Management: Goal: Pain level will decrease with appropriate interventions Outcome: Progressing

## 2023-11-03 ENCOUNTER — Other Ambulatory Visit (HOSPITAL_COMMUNITY): Payer: Self-pay

## 2023-11-03 LAB — URINALYSIS, ROUTINE W REFLEX MICROSCOPIC
Glucose, UA: NEGATIVE mg/dL
Hgb urine dipstick: NEGATIVE
Ketones, ur: NEGATIVE mg/dL
Leukocytes,Ua: NEGATIVE
Nitrite: NEGATIVE
Protein, ur: 30 mg/dL — AB
Specific Gravity, Urine: 1.03 (ref 1.005–1.030)
pH: 5 (ref 5.0–8.0)

## 2023-11-03 MED ORDER — TRAMADOL HCL 50 MG PO TABS
50.0000 mg | ORAL_TABLET | Freq: Four times a day (QID) | ORAL | 0 refills | Status: DC | PRN
Start: 2023-11-03 — End: 2024-01-16
  Filled 2023-11-03: qty 40, 5d supply, fill #0

## 2023-11-03 MED ORDER — RIVAROXABAN 10 MG PO TABS
10.0000 mg | ORAL_TABLET | Freq: Every day | ORAL | 0 refills | Status: AC
  Filled 2023-11-03: qty 17, 17d supply, fill #0

## 2023-11-03 MED ORDER — METHOCARBAMOL 500 MG PO TABS
500.0000 mg | ORAL_TABLET | Freq: Four times a day (QID) | ORAL | 0 refills | Status: DC | PRN
  Filled 2023-11-03: qty 40, 10d supply, fill #0

## 2023-11-03 MED ORDER — HYDROMORPHONE HCL 2 MG PO TABS
2.0000 mg | ORAL_TABLET | Freq: Four times a day (QID) | ORAL | 0 refills | Status: DC | PRN
Start: 2023-11-03 — End: 2023-11-04
  Filled 2023-11-03: qty 42, 6d supply, fill #0

## 2023-11-03 MED ORDER — ONDANSETRON HCL 4 MG PO TABS
4.0000 mg | ORAL_TABLET | Freq: Four times a day (QID) | ORAL | 0 refills | Status: DC | PRN
  Filled 2023-11-03: qty 20, 20d supply, fill #0

## 2023-11-03 NOTE — Progress Notes (Signed)
 Physical Therapy Treatment Patient Details Name: Rawlin Reaume MRN: [id number] DOB: 05-18-1941 Today's Date: 11/03/2023   History of Present Illness 83 yo male s/p L TKA on 10/30/23. PMH: lumbar laminectomy, prostate CA s/p prostatectomy, vocal cord paralysis, afib-s/p ablation, CAD, thymectomy, resection mediastinal mass. Incont bladder/wears briefs    PT Comments  POD # 4 am session MUCH improved cognition and level of alterness now that he is no longer taking Narcotics. Pt was OOB in recliner.  Slept there last night, stated pt.  Assisted with amb in hallway.  General transfer comment: increased self ability to rise from recliner with less VC's needed due to increased cognition.  General Gait Details: tolerated an increased distance and able to weight shift onto his L LE with pain 6/10.  Steps are still short along with a small "hopping" gait with excessive lean on walker.  Distance limited fatigue/effort.  Recliner following for safety. Then returned to room to perform some TE's following HEP handout.  Instructed on proper tech, freq as well as use of ICE.  Tolerating knee flex up to 40 degrees. Will see pt again this afternoon.  Pt will need to tolerate more weight bearing through L LE to attempts stairs. Predict pt will need one more day and D/C Saturday.     If plan is discharge home, recommend the following: Help with stairs or ramp for entrance;Assistance with cooking/housework;Assist for transportation   Can travel by private vehicle        Equipment Recommendations  None recommended by PT    Recommendations for Other Services       Precautions / Restrictions Precautions Precautions: Knee Precaution/Restrictions Comments: no pillow under knee Restrictions Weight Bearing Restrictions Per Provider Order: No LLE Weight Bearing Per Provider Order: Weight bearing as tolerated     Mobility  Bed Mobility               General bed mobility comments: OOB in recliner     Transfers Overall transfer level: Needs assistance   Transfers: Sit to/from Stand Sit to Stand: Supervision, Contact guard assist           General transfer comment: increased self ability to rise from recliner with less VC's needed due to increased cognition    Ambulation/Gait Ambulation/Gait assistance: Contact guard assist Gait Distance (Feet): 35 Feet Assistive device: Rolling walker (2 wheels) Gait Pattern/deviations: Step-to pattern, Antalgic, Decreased weight shift to left Gait velocity: decreased     General Gait Details: tolerated an increased distance and able to weight shift onto his L LE with pain 6/10.  Steps are still short along with a small "hopping" gait with excessive lean on walker.  Distance limited fatigue/effort.  Recliner following for safety.   Stairs             Wheelchair Mobility     Tilt Bed    Modified Rankin (Stroke Patients Only)       Balance                                            Communication    Cognition Arousal: Alert Behavior During Therapy: WFL for tasks assessed/performed   PT - Cognitive impairments: No apparent impairments                       PT - Cognition Comments: MUCH improved cofnition  and level of alterness now that he is no longer taking Narcotics.        Cueing    Exercises  Total Knee Replacement TE's following HEP handout 10 reps B LE ankle pumps 05 reps towel squeezes 05 reps knee presses 05 reps heel slides  05 reps SAQ's 05 reps SLR's 05 reps ABD Educated on use of gait belt to assist with TE's Followed by ICE     General Comments        Pertinent Vitals/Pain Pain Assessment Pain Assessment: 0-10 Pain Score: 6  Pain Location: L knee Pain Descriptors / Indicators: Aching, Sore, Grimacing, Guarding, Operative site guarding Pain Intervention(s): Monitored during session, Premedicated before session, Repositioned, Ice applied    Home Living                           Prior Function            PT Goals (current goals can now be found in the care plan section) Progress towards PT goals: Progressing toward goals    Frequency    7X/week      PT Plan      Co-evaluation              AM-PAC PT "6 Clicks" Mobility   Outcome Measure  Help needed turning from your back to your side while in a flat bed without using bedrails?: A Little Help needed moving from lying on your back to sitting on the side of a flat bed without using bedrails?: A Little Help needed moving to and from a bed to a chair (including a wheelchair)?: A Little Help needed standing up from a chair using your arms (e.g., wheelchair or bedside chair)?: A Little Help needed to walk in hospital room?: A Lot Help needed climbing 3-5 steps with a railing? : Total 6 Click Score: 15    End of Session Equipment Utilized During Treatment: Gait belt Activity Tolerance: Patient limited by fatigue;Patient limited by pain Patient left: in chair;with call bell/phone within reach;with family/visitor present Nurse Communication: Mobility status PT Visit Diagnosis: Other abnormalities of gait and mobility (R26.89)     Time: 1000-1025 PT Time Calculation (min) (ACUTE ONLY): 25 min  Charges:    $Gait Training: 8-22 mins $Therapeutic Exercise: 8-22 mins PT General Charges $$ ACUTE PT VISIT: 1 Visit                     Felecia Shelling  PTA Acute  Rehabilitation Services Office M-F          [phone]

## 2023-11-03 NOTE — Progress Notes (Signed)
   Subjective: 4 Days Post-Op Procedure(s) (LRB): TOTAL KNEE ARTHROPLASTY (Left) Patient seen in rounds for Dr. Lequita Halt. Patient doing well this AM. Is in my better spirits and states pain has significantly improved. He does mention that he has noticed blood in his urine. Hx of incontinence. Cannot tell if painful urination or urinating more frequently.  Objective: Vital signs in last 24 hours: Temp:  [97.8 F (36.6 C)-99.3 F (37.4 C)] 97.8 F (36.6 C) (02/21 0440) Pulse Rate:  [66-79] 66 (02/21 0440) Resp:  [16-18] 16 (02/21 0440) BP: (102-119)/(51-64) 102/52 (02/21 0440) SpO2:  [98 %-100 %] 99 % (02/21 0440)  Intake/Output from previous day:  Intake/Output Summary (Last 24 hours) at 11/03/2023 0734 Last data filed at 11/03/2023 0600 Gross per 24 hour  Intake 780 ml  Output 1400 ml  Net -620 ml    Intake/Output this shift: No intake/output data recorded.  Labs: Recent Labs    11/01/23 0358  HGB 9.6*   Recent Labs    11/01/23 0358  WBC 9.9  RBC 3.15*  HCT 30.0*  PLT 150   No results for input(s): "NA", "K", "CL", "CO2", "BUN", "CREATININE", "GLUCOSE", "CALCIUM" in the last 72 hours. No results for input(s): "LABPT", "INR" in the last 72 hours.  Exam: General - Patient is Alert and Oriented Extremity - Neurologically intact Neurovascular intact Sensation intact distally Dorsiflexion/Plantar flexion intact Dressing/Incision - clean, dry, no drainage Motor Function - intact, moving foot and toes well on exam.  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    Colon polyps    hyperplastic and tubular adenomatous   Coronary atherosclerosis due to calcified coronary lesion    Diverticulosis    Dyspnea    Dysrhythmia    hx at fib- ablation 8 yrs ago   Erectile dysfunction  following radical prostatectomy    Hypercapnic respiratory failure, chronic (HCC) 05/01/2015   Mediastinal mass    per CT CHEST 11/07/14 @ [facility].[name] KitchenANTERIOR...4.2 X 3.3 cm soft tissue   Neuromuscular disorder (HCC)    Prostate cancer (HCC)    prostate cancer   Radiation    for prostate cancer   Radiation proctitis    rectum   Recurrent laryngeal nerve palsy    Respiratory failure (HCC)    s/p phrenic nerve palsy    Thrombocytopenia (HCC)    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   Weight as of this encounter: 80 kg.  DVT Prophylaxis - Xarelto Weight-bearing as tolerated.  Continue physical therapy. Hopeful to discharge home pending progress and pain managed. Scheduled for OPPT at [facility].  Discussed with patient that some blood in urine can happen due to catheterization for procedure. Will attempt UA and continue to monitor. If persists or urination becomes painful/more frequent, patient advised to let us know.  R. Arcola Jansky, PA-C Orthopedic Surgery 11/03/2023, 7:34 AM

## 2023-11-03 NOTE — Plan of Care (Signed)
  Problem: Pain Managment: Goal: General experience of comfort will improve and/or be controlled Outcome: Progressing   Problem: Safety: Goal: Ability to remain free from injury will improve Outcome: Progressing   Problem: Skin Integrity: Goal: Risk for impaired skin integrity will decrease Outcome: Progressing   Problem: Pain Management: Goal: Pain level will decrease with appropriate interventions Outcome: Progressing

## 2023-11-03 NOTE — Progress Notes (Signed)
 Physical Therapy Treatment Patient Details Name: [name] [name] MRN: [id number] DOB: Nov 23, 1940 Today's Date: 11/03/2023   History of Present Illness 83 yo male s/p L TKA on 10/30/23. PMH: lumbar laminectomy, prostate CA s/p prostatectomy, vocal cord paralysis, afib-s/p ablation, CAD, thymectomy, resection mediastinal mass. Incont bladder/wears briefs    PT Comments  POD # 4 pm session PT - Cognition Comments: MUCH improved cofnition and level of alterness now that he is no longer taking Narcotics. Pt c/o feeling "cold".  Under several blankets.  Assisted with amb in hallway.  General transfer comment: increased self ability to rise from recliner with less VC's needed due to increased cognition.  General Gait Details: tolerated an increased distance and able to weight shift onto his L LE with pain 6/10.  Steps are still short along with a small "hopping" gait with excessive lean on walker.  Distance limited fatigue/effort.  Recliner following for safety. Then returned to room to perform some TE's following HEP handout.  Instructed on proper tech, freq as well as use of ICE.   Tolerating knee flex up to 40 degrees. Pt will need to tolerate more weight bearing through L LE to attempts stairs. Predict pt will need one more day/night and D/C Saturday after one or two PT sessions.   If plan is discharge home, recommend the following: Help with stairs or ramp for entrance;Assistance with cooking/housework;Assist for transportation   Can travel by private vehicle        Equipment Recommendations  None recommended by PT    Recommendations for Other Services       Precautions / Restrictions Precautions Precautions: Knee Precaution/Restrictions Comments: no pillow under knee Restrictions Weight Bearing Restrictions Per Provider Order: No LLE Weight Bearing Per Provider Order: Weight bearing as tolerated     Mobility  Bed Mobility               General bed mobility comments: OOB in  recliner    Transfers Overall transfer level: Needs assistance Equipment used: Rolling walker (2 wheels) Transfers: Sit to/from Stand Sit to Stand: Supervision           General transfer comment: increased self ability to rise from recliner with less VC's needed due to increased cognition    Ambulation/Gait Ambulation/Gait assistance: Supervision, Contact guard assist Gait Distance (Feet): 38 Feet Assistive device: Rolling walker (2 wheels) Gait Pattern/deviations: Step-to pattern, Antalgic, Decreased weight shift to left Gait velocity: decreased     General Gait Details: tolerated an increased distance and able to weight shift onto his L LE with pain 6/10.  Steps are still short along with a small "hopping" gait with excessive lean on walker.  Distance limited fatigue/effort.  Recliner following for safety.   Stairs             Wheelchair Mobility     Tilt Bed    Modified Rankin (Stroke Patients Only)       Balance                                            Communication    Cognition Arousal: Alert Behavior During Therapy: WFL for tasks assessed/performed   PT - Cognitive impairments: No apparent impairments                       PT - Cognition Comments: MUCH improved cofnition  and level of alterness now that he is no longer taking Narcotics. Following commands: Intact      Cueing    Exercises  Total Knee Replacement TE's following HEP handout 10 reps B LE ankle pumps 05 reps towel squeezes 05 reps knee presses 05 reps heel slides  05 reps SLR's 05 reps ABD Educated on use of gait belt to assist with TE's Followed by ICE     General Comments        Pertinent Vitals/Pain Pain Assessment Pain Assessment: 0-10 Pain Score: 3  Pain Location: L knee Pain Descriptors / Indicators: Aching, Sore, Grimacing, Guarding, Operative site guarding Pain Intervention(s): Monitored during session, Premedicated before  session, Repositioned, Ice applied    Home Living                          Prior Function            PT Goals (current goals can now be found in the care plan section) Progress towards PT goals: Progressing toward goals    Frequency    7X/week      PT Plan      Co-evaluation              AM-PAC PT "6 Clicks" Mobility   Outcome Measure  Help needed turning from your back to your side while in a flat bed without using bedrails?: A Little Help needed moving from lying on your back to sitting on the side of a flat bed without using bedrails?: A Little Help needed moving to and from a bed to a chair (including a wheelchair)?: A Little Help needed standing up from a chair using your arms (e.g., wheelchair or bedside chair)?: A Little Help needed to walk in hospital room?: A Lot Help needed climbing 3-5 steps with a railing? : Total 6 Click Score: 15    End of Session Equipment Utilized During Treatment: Gait belt Activity Tolerance: Patient limited by fatigue Patient left: in chair;with call bell/phone within reach;with family/visitor present Nurse Communication: Mobility status PT Visit Diagnosis: Other abnormalities of gait and mobility (R26.89)     Time: 1425-1450 PT Time Calculation (min) (ACUTE ONLY): 25 min  Charges:    $Gait Training: 8-22 mins $Therapeutic Exercise: 8-22 mins PT General Charges $$ ACUTE PT VISIT: 1 Visit                     Felecia Shelling  PTA Acute  Rehabilitation Services Office M-F          [phone]

## 2023-11-03 NOTE — Care Management Important Message (Signed)
 Important Message  Patient Details IM Letter given. Name: [name] [name] MRN: [id number] Date of Birth: 04-Sep-1941   Important Message Given:  Yes - Medicare IM     Caren Macadam 11/03/2023, 10:04 AM

## 2023-11-04 ENCOUNTER — Other Ambulatory Visit (HOSPITAL_COMMUNITY): Payer: Self-pay

## 2023-11-04 NOTE — Progress Notes (Signed)
 Patient ID: Taven Strite, male   DOB: 06-09-1941, 83 y.o.   MRN: [id number] Subjective: 5 Days Post-Op Procedure(s) (LRB): TOTAL KNEE ARTHROPLASTY (Left)    Patient reports pain as moderate.  Doing a lot better than initial post op period now taking Tramadol and Robaxin  Objective:   VITALS:   Vitals:   11/03/23 2243 11/04/23 0632  BP: 118/63 108/68  Pulse: 73 78  Resp: 18 16  Temp: 98.9 F (37.2 C) 100.3 F (37.9 C)  SpO2: 94% 95%    Neurovascular intact Incision: dressing C/D/I - left knee  LABS No results for input(s): "HGB", "HCT", "WBC", "PLT" in the last 72 hours.  No results for input(s): "NA", "K", "BUN", "CREATININE", "GLUCOSE" in the last 72 hours.  No results for input(s): "LABPT", "INR" in the last 72 hours.   Assessment/Plan: 5 Days Post-Op Procedure(s) (LRB): TOTAL KNEE ARTHROPLASTY (Left)   Up with therapy Likely home today after therapy given current progress RTC in 2 weeks to see Aluisio Rx will be sent based on current regiment

## 2023-11-04 NOTE — Progress Notes (Signed)
 TOC discharge meds in a secure bag delivered to pt in room - primary nurse in room when this RN delivered

## 2023-11-04 NOTE — Plan of Care (Signed)
  Problem: Clinical Measurements: Goal: Ability to maintain clinical measurements within normal limits will improve Outcome: Progressing   Problem: Pain Managment: Goal: General experience of comfort will improve and/or be controlled Outcome: Progressing   Problem: Safety: Goal: Ability to remain free from injury will improve Outcome: Progressing   Problem: Skin Integrity: Goal: Risk for impaired skin integrity will decrease Outcome: Progressing   Problem: Pain Management: Goal: Pain level will decrease with appropriate interventions Outcome: Progressing

## 2023-11-04 NOTE — Progress Notes (Signed)
 Physical Therapy Treatment Patient Details Name: [name] [name] MRN: [id number] DOB: 1941-06-11 Today's Date: 11/04/2023   History of Present Illness 83 yo male s/p L TKA on 10/30/23. PMH: lumbar laminectomy, prostate CA s/p prostatectomy, vocal cord paralysis, afib-s/p ablation, CAD, thymectomy, resection mediastinal mass. Incont bladder/wears briefs    PT Comments  Pt eager to d/c home today.  Pt ambulated short distance and practiced safe stair technique.  Pt has a couple steps throughout home per his report but does have a ramp to enter home.  Pt provided with stair handout.  Pt feels ready for d/c home today.     If plan is discharge home, recommend the following: Help with stairs or ramp for entrance;Assistance with cooking/housework;Assist for transportation   Can travel by private vehicle        Equipment Recommendations  None recommended by PT    Recommendations for Other Services       Precautions / Restrictions Precautions Precautions: Knee;Fall Restrictions LLE Weight Bearing Per Provider Order: Weight bearing as tolerated     Mobility  Bed Mobility               General bed mobility comments: OOB in recliner    Transfers Overall transfer level: Needs assistance Equipment used: Rolling walker (2 wheels) Transfers: Sit to/from Stand Sit to Stand: Supervision           General transfer comment: cues for UE and LE positioning for pain control    Ambulation/Gait Ambulation/Gait assistance: Contact guard assist, Supervision Gait Distance (Feet): 16 Feet Assistive device: Rolling walker (2 wheels) Gait Pattern/deviations: Step-to pattern, Antalgic, Decreased weight shift to left       General Gait Details: adjusted RW for better fit, pt reports feeling better set at this height today, appears able to take some weight through L LE, cues for sequence, RW positioning, posture; pt self limited distance due to fatigue and wanting to d/c home (save energy)  so ambulated to/from steps   Stairs Stairs: Yes Stairs assistance: Contact guard assist Stair Management: Step to pattern, Backwards, With walker Number of Stairs: 2 General stair comments: verbal cues for sequence and safety; also provided handout on technique; pt declined practicing again   Wheelchair Mobility     Tilt Bed    Modified Rankin (Stroke Patients Only)       Balance                                            Communication Communication Communication: No apparent difficulties  Cognition Arousal: Alert Behavior During Therapy: WFL for tasks assessed/performed   PT - Cognitive impairments: No apparent impairments                         Following commands: Intact      Cueing    Exercises      General Comments        Pertinent Vitals/Pain Pain Assessment Pain Assessment: 0-10 Pain Score: 3  Pain Location: L knee Pain Descriptors / Indicators: Aching, Sore Pain Intervention(s): Monitored during session, Repositioned    Home Living                          Prior Function            PT Goals (current goals can  now be found in the care plan section) Progress towards PT goals: Progressing toward goals    Frequency    7X/week      PT Plan      Co-evaluation              AM-PAC PT "6 Clicks" Mobility   Outcome Measure  Help needed turning from your back to your side while in a flat bed without using bedrails?: A Little Help needed moving from lying on your back to sitting on the side of a flat bed without using bedrails?: A Little Help needed moving to and from a bed to a chair (including a wheelchair)?: A Little Help needed standing up from a chair using your arms (e.g., wheelchair or bedside chair)?: A Little Help needed to walk in hospital room?: A Little Help needed climbing 3-5 steps with a railing? : A Little 6 Click Score: 18    End of Session Equipment Utilized During  Treatment: Gait belt Activity Tolerance: Patient limited by fatigue Patient left: in chair;with call bell/phone within reach;with chair alarm set Nurse Communication: Mobility status PT Visit Diagnosis: Other abnormalities of gait and mobility (R26.89)     Time: 1030-1048 PT Time Calculation (min) (ACUTE ONLY): 18 min  Charges:    $Gait Training: 8-22 mins PT General Charges $$ ACUTE PT VISIT: 1 Visit                    Paulino Door, DPT Physical Therapist Acute Rehabilitation Services Office: [phone]   [name] [name] 11/04/2023, 2:23 PM

## 2023-11-06 NOTE — Discharge Summary (Signed)
 Patient ID: [name] [name] MRN: [id number] DOB/AGE: 82-Jan-1942 82 y.o.  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    Colon polyps    hyperplastic and tubular adenomatous   Coronary atherosclerosis due to calcified coronary lesion    Diverticulosis    Dyspnea    Dysrhythmia    hx at fib- ablation 8 yrs ago   Erectile dysfunction following radical prostatectomy    Hypercapnic respiratory failure, chronic (HCC) 05/01/2015   Mediastinal mass    per CT CHEST 11/07/14 @ [facility].[name] KitchenANTERIOR...4.2 X 3.3 cm soft tissue   Neuromuscular disorder (HCC)    Prostate cancer [facility])    prostate cancer   Radiation    for prostate cancer   Radiation proctitis    rectum   Recurrent laryngeal nerve palsy    Respiratory failure (HCC)    s/p phrenic nerve palsy    Thrombocytopenia (HCC)    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 male who was admitted 10/30/2023 for operative treatment ofOA (osteoarthritis) of knee. Patient has severe unremitting pain that affects sleep, daily activities, and work/hobbies. After pre-op clearance the patient was taken to the operating room on 10/30/2023 and underwent  Procedure(s): TOTAL KNEE ARTHROPLASTY.    Patient was given perioperative antibiotics:  Anti-infectives (From admission, onward)    Start     Dose/Rate Route Frequency Ordered Stop   10/30/23 1330  ceFAZolin (ANCEF)  IVPB 2g/100 mL premix        2 g 200 mL/hr over 30 Minutes Intravenous Every 6 hours 10/30/23 1031 10/30/23 2036   10/30/23 0600  ceFAZolin (ANCEF) IVPB 2g/100 mL premix        2 g 200 mL/hr over 30 Minutes Intravenous On call to O.R. 10/30/23 0545 10/30/23 0725        Patient was given sequential compression devices, early ambulation, and chemoprophylaxis to prevent DVT.  Patient benefited maximally from hospital stay and there were no complications.    Recent vital signs: No data found.   Recent laboratory studies: No results for input(s): "WBC", "HGB", "HCT", "PLT", "NA", "K", "CL", "CO2", "BUN", "CREATININE", "GLUCOSE", "INR", "CALCIUM" in the last 72 hours.  Invalid input(s): "PT", "2"   Discharge Medications:   Allergies as of 11/04/2023   No Known Allergies      Medication List     STOP taking these medications    aspirin EC 81 MG tablet       TAKE these medications    methocarbamol 500 MG tablet Commonly known as: ROBAXIN Take 1 tablet (500 mg total) by mouth every 6 (six) hours as needed for muscle spasms.   ondansetron 4 MG tablet Commonly known as: ZOFRAN Take 1 tablet (4 mg total) by mouth every 6 (six) hours as needed for nausea.   rosuvastatin 20 MG tablet Commonly known as: CRESTOR Take 1 tablet (20 mg total) by mouth at bedtime.   traMADol 50 MG tablet Commonly known  as: ULTRAM Take 1-2 tablets (50-100 mg total) by mouth every 6 (six) hours as needed for moderate pain (pain score 4-6).   Xarelto 10 MG Tabs tablet Generic drug: rivaroxaban Take 1 tablet (10 mg total) by mouth daily with breakfast for 17 days. Then resume one 81 mg aspirin once a day               Discharge Care Instructions  (From admission, onward)           Start     Ordered   11/04/23 0000  Change dressing       Comments: Maintain surgical dressing until follow up in the clinic. If the edges start to pull up, may reinforce with tape. If the dressing is no  longer working, may remove and cover with gauze and tape, but must keep the area dry and clean.  Call with any questions or concerns.   11/04/23 0749   11/03/23 0000  Weight bearing as tolerated        11/03/23 0812   11/03/23 0000  Change dressing       Comments: You may remove the bulky bandage (ACE wrap and gauze) two days after surgery. You will have an adhesive waterproof bandage underneath. Leave this in place until your first follow-up appointment.   11/03/23 8119            Diagnostic Studies: No results found.  Disposition: Discharge disposition: 01-Home or Self Care       Discharge Instructions     Call MD / Call 911   Complete by: As directed    If you experience chest pain or shortness of breath, CALL 911 and be transported to the hospital emergency room.  If you develope a fever above 101 F, pus (white drainage) or increased drainage or redness at the wound, or calf pain, call your surgeon's office.   Call MD / Call 911   Complete by: As directed    If you experience chest pain or shortness of breath, CALL 911 and be transported to the hospital emergency room.  If you develope a fever above 101 F, pus (white drainage) or increased drainage or redness at the wound, or calf pain, call your surgeon's office.   Change dressing   Complete by: As directed    You may remove the bulky bandage (ACE wrap and gauze) two days after surgery. You will have an adhesive waterproof bandage underneath. Leave this in place until your first follow-up appointment.   Change dressing   Complete by: As directed    Maintain surgical dressing until follow up in the clinic. If the edges start to pull up, may reinforce with tape. If the dressing is no longer working, may remove and cover with gauze and tape, but must keep the area dry and clean.  Call with any questions or concerns.   Constipation Prevention   Complete by: As directed    Drink plenty of fluids.  Prune juice may be helpful.   You may use a stool softener, such as Colace (over the counter) 100 mg twice a day.  Use MiraLax (over the counter) for constipation as needed.   Constipation Prevention   Complete by: As directed    Drink plenty of fluids.  Prune juice may be helpful.  You may use a stool softener, such as Colace (over the counter) 100 mg twice a day.  Use MiraLax (over the counter) for constipation as needed.   Diet -  low sodium heart healthy   Complete by: As directed    Diet - low sodium heart healthy   Complete by: As directed    Do not put a pillow under the knee. Place it under the heel.   Complete by: As directed    Driving restrictions   Complete by: As directed    No driving for two weeks   Increase activity slowly as tolerated   Complete by: As directed    Weight bearing as tolerated with assist device (walker, cane, etc) as directed, use it as long as suggested by your surgeon or therapist, typically at least 4-6 weeks.   Post-operative opioid taper instructions:   Complete by: As directed    POST-OPERATIVE OPIOID TAPER INSTRUCTIONS: It is important to wean off of your opioid medication as soon as possible. If you do not need pain medication after your surgery it is ok to stop day one. Opioids include: Codeine, Hydrocodone(Norco, Vicodin), Oxycodone(Percocet, oxycontin) and hydromorphone amongst others.  Long term and even short term use of opiods can cause: Increased pain response Dependence Constipation Depression Respiratory depression And more.  Withdrawal symptoms can include Flu like symptoms Nausea, vomiting And more Techniques to manage these symptoms Hydrate well Eat regular healthy meals Stay active Use relaxation techniques(deep breathing, meditating, yoga) Do Not substitute Alcohol to help with tapering If you have been on opioids for less than two weeks and do not have pain than it is ok to stop all together.  Plan to wean off of opioids This plan should start within  one week post op of your joint replacement. Maintain the same interval or time between taking each dose and first decrease the dose.  Cut the total daily intake of opioids by one tablet each day Next start to increase the time between doses. The last dose that should be eliminated is the evening dose.      Post-operative opioid taper instructions:   Complete by: As directed    POST-OPERATIVE OPIOID TAPER INSTRUCTIONS: It is important to wean off of your opioid medication as soon as possible. If you do not need pain medication after your surgery it is ok to stop day one. Opioids include: Codeine, Hydrocodone(Norco, Vicodin), Oxycodone(Percocet, oxycontin) and hydromorphone amongst others.  Long term and even short term use of opiods can cause: Increased pain response Dependence Constipation Depression Respiratory depression And more.  Withdrawal symptoms can include Flu like symptoms Nausea, vomiting And more Techniques to manage these symptoms Hydrate well Eat regular healthy meals Stay active Use relaxation techniques(deep breathing, meditating, yoga) Do Not substitute Alcohol to help with tapering If you have been on opioids for less than two weeks and do not have pain than it is ok to stop all together.  Plan to wean off of opioids This plan should start within one week post op of your joint replacement. Maintain the same interval or time between taking each dose and first decrease the dose.  Cut the total daily intake of opioids by one tablet each day Next start to increase the time between doses. The last dose that should be eliminated is the evening dose.      TED hose   Complete by: As directed    Use stockings (TED hose) for three weeks on both leg(s).  You may remove them at night for sleeping.   TED hose   Complete by: As directed    Use stockings (TED hose) for 2 weeks on both leg(s).  You  may remove them at night for sleeping.   Weight bearing as tolerated    Complete by: As directed         Follow-up Information     Aluisio, Homero Fellers, MD Follow up in 2 week(s).   Specialty: Orthopedic Surgery Contact information: [address] [place] [address] [phone]                  Signed: Arther Abbott 11/06/2023, 7:15 AM

## 2023-11-13 ENCOUNTER — Encounter: Payer: Self-pay | Admitting: Pharmacist

## 2023-11-13 ENCOUNTER — Other Ambulatory Visit (HOSPITAL_COMMUNITY): Payer: Self-pay

## 2023-11-15 LAB — LIPID PANEL
Chol/HDL Ratio: 2.5 ratio (ref 0.0–5.0)
Cholesterol, Total: 134 mg/dL (ref 100–199)
HDL: 54 mg/dL (ref >39)
LDL Chol Calc (NIH): 61 mg/dL (ref 0–99)
Triglycerides: 100 mg/dL (ref 0–149)
VLDL Cholesterol Cal: 19 mg/dL (ref 5–40)

## 2023-11-19 ENCOUNTER — Encounter: Payer: Self-pay | Admitting: Cardiology

## 2023-11-25 ENCOUNTER — Encounter (HOSPITAL_BASED_OUTPATIENT_CLINIC_OR_DEPARTMENT_OTHER): Payer: Self-pay

## 2023-11-25 ENCOUNTER — Emergency Department (HOSPITAL_BASED_OUTPATIENT_CLINIC_OR_DEPARTMENT_OTHER)

## 2023-11-25 ENCOUNTER — Emergency Department (HOSPITAL_BASED_OUTPATIENT_CLINIC_OR_DEPARTMENT_OTHER)
Admission: EM | Admit: 2023-11-25 | Discharge: 2023-11-25 | Disposition: A | Discharge status: Home / Self Care | Attending: Emergency Medicine | Admitting: Emergency Medicine

## 2023-11-25 DIAGNOSIS — R002 Palpitations: Secondary | ICD-10-CM | POA: Diagnosis present

## 2023-11-25 DIAGNOSIS — I4891 Unspecified atrial fibrillation: Secondary | ICD-10-CM | POA: Diagnosis not present

## 2023-11-25 LAB — BASIC METABOLIC PANEL
Anion gap: 11 (ref 5–15)
BUN: 16 mg/dL (ref 8–23)
CO2: 25 mmol/L (ref 22–32)
Calcium: 9.6 mg/dL (ref 8.9–10.3)
Chloride: 100 mmol/L (ref 98–111)
Creatinine, Ser: 0.72 mg/dL (ref 0.61–1.24)
GFR, Estimated: >60 mL/min (ref >60)
Glucose, Bld: 173 mg/dL — ABNORMAL HIGH (ref 70–99)
Potassium: 3.8 mmol/L (ref 3.5–5.1)
Sodium: 136 mmol/L (ref 135–145)

## 2023-11-25 LAB — CBC
HCT: 35.2 % — ABNORMAL LOW (ref 39.0–52.0)
Hemoglobin: 11.2 g/dL — ABNORMAL LOW (ref 13.0–17.0)
MCH: 29.4 pg (ref 26.0–34.0)
MCHC: 31.8 g/dL (ref 30.0–36.0)
MCV: 92.4 fL (ref 80.0–100.0)
Platelets: 391 10*3/uL (ref 150–400)
RBC: 3.81 MIL/uL — ABNORMAL LOW (ref 4.22–5.81)
RDW: 15.9 % — ABNORMAL HIGH (ref 11.5–15.5)
WBC: 5.6 10*3/uL (ref 4.0–10.5)
nRBC: 0 % (ref 0.0–0.2)

## 2023-11-25 LAB — TROPONIN I (HIGH SENSITIVITY): Troponin I (High Sensitivity): 12 ng/L (ref <18)

## 2023-11-25 MED ORDER — DILTIAZEM HCL 60 MG PO TABS: 60.0000 mg | ORAL_TABLET | Freq: Four times a day (QID) | ORAL | 0 refills | Status: DC | PRN

## 2023-11-25 MED ORDER — DILTIAZEM HCL 25 MG/5ML IV SOLN
15.0000 mg | Freq: Once | INTRAVENOUS | Status: AC
  Administered 2023-11-25: 15 mg via INTRAVENOUS
  Filled 2023-11-25: qty 5

## 2023-11-25 NOTE — ED Notes (Signed)
 He is now in NSR and remains pain-free and is breathing normally. D/c to home per w/c at this time.

## 2023-11-25 NOTE — Discharge Instructions (Addendum)
 You were given a dose of diltiazem in the ER for atrial fibrillation.  Your heart rate converted back to a normal sinus rhythm.  I did prescribe you this medication uses a "rescue medicine" if you go back into A-fib at home.  You can take 1 dose every 6 hours as needed for heart rate over 100 beats per minute, while at rest.  If you are feeling lightheaded, short of breath, chest pressure, please call 911 or return to the ER.  If your heart rate remains persistently irregular and above 100 beats per minute, you should also return back to the emergency department.  Please follow-up with your cardiologist next week as scheduled.

## 2023-11-25 NOTE — ED Provider Notes (Signed)
 [place] EMERGENCY DEPARTMENT AT [facility] Provider Note   CSN: [id number] Arrival date & time: 11/25/23  2536     History  Chief Complaint  Patient presents with   Irregular Heart Beat    [name] [name] is a 83 y.o. male w/ hx of paroxysmal A Fib s/p ablation, presenting to ED with palpitations.  Patient reports he had a left total knee replacement about a month ago, for which he was on 2 weeks of baby aspirin and then discontinued.  He has been doing well with his knee.  He did have a urinary tract infection about a week ago and was given antibiotics in which he is on his last day, with improvement of his urinary symptoms.  He noted around 5 AM this morning, which is 3 hours ago, he awoke with palpitations and checked his heart rate at home and it was about 150.  He has a history of A-fib and says it feels very similar to that.  He is not currently on any rate control anticoagulation medications.  He says in the past he has had 6 cardioversions which did not last long and ultimately had an ablation 6 years ago which is kept him out of A-fib since then.  He takes Crestor, no other underlying medications.  He is here with his wife at bedside.  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  He does report he has a cardiology appointment coming up on Monday in 2 days.  HPI     Home Medications Prior to Admission medications   Medication Sig Start Date End Date Taking? Authorizing Provider  diltiazem (CARDIZEM) 60 MG tablet Take 1 tablet (60 mg total) by mouth 4 (four) times daily as needed for up to 30 doses. For heart rate above 100 beats per minute persistently while at rest 11/25/23  Yes Amika Tassin, Kermit Balo, MD  methocarbamol (ROBAXIN) 500 MG tablet Take 1 tablet (500 mg total) by mouth every 6 (six) hours as needed for muscle spasms. 11/03/23   Eartha Inch, PA  ondansetron (ZOFRAN) 4 MG  tablet Take 1 tablet (4 mg total) by mouth every 6 (six) hours as needed for nausea. 11/03/23   Eartha Inch, PA  rosuvastatin (CRESTOR) 20 MG tablet Take 1 tablet (20 mg total) by mouth at bedtime. 09/15/23   Tolia, Sunit, DO  traMADol (ULTRAM) 50 MG tablet Take 1-2 tablets (50-100 mg total) by mouth every 6 (six) hours as needed for moderate pain (pain score 4-6). 11/03/23   Eartha Inch, PA      Allergies    Patient has no known allergies.    Review of Systems   Review of Systems  Physical Exam Updated Vital Signs BP 105/61 (BP Location: Left Arm)   Pulse 66   Temp 98.2 F (36.8 C) (Oral)   Resp (!) 22   SpO2 96%  Physical Exam Constitutional:      General: He is not in acute distress. HENT:     Head: Normocephalic and atraumatic.  Eyes:     Conjunctiva/sclera: Conjunctivae normal.     Pupils: Pupils are equal, round, and reactive to light.  Cardiovascular:     Rate and Rhythm: Tachycardia present. Rhythm irregular.  Pulmonary:     Effort: Pulmonary effort is normal. No respiratory distress.  Abdominal:     General: There is no distension.     Tenderness: There is no  abdominal tenderness.  Musculoskeletal:     Comments: Minor swelling to the left surgical site but no surrounding erythema of the wound site, which appears well healed  Skin:    General: Skin is warm and dry.  Neurological:     General: No focal deficit present.     Mental Status: He is alert. Mental status is at baseline.  Psychiatric:        Mood and Affect: Mood normal.        Behavior: Behavior normal.     ED Results / Procedures / Treatments   Labs (all labs ordered are listed, but only abnormal results are displayed) Labs Reviewed  BASIC METABOLIC PANEL - Abnormal; Notable for the following components:      Result Value   Glucose, Bld 173 (*)    All other components within normal limits  CBC - Abnormal; Notable for the following components:   RBC 3.81 (*)    Hemoglobin 11.2 (*)     HCT 35.2 (*)    RDW 15.9 (*)    All other components within normal limits  TROPONIN I (HIGH SENSITIVITY)    EKG EKG Interpretation Date/Time:  Saturday November 25 2023 09:47:52 EDT Ventricular Rate:  67 PR Interval:  195 QRS Duration:  97 QT Interval:  392 QTC Calculation: 414 R Axis:   12  Text Interpretation: Sinus rhythm Abnormal R-wave progression, early transition Confirmed by Alvester Chou [phone]) on 11/25/2023 9:55:58 AM  Radiology DG Chest Port 1 View Result Date: 11/25/2023 CLINICAL DATA:  Irregular heartbeat.  AFib. EXAM: PORTABLE CHEST 1 VIEW COMPARISON:  05/02/2023. FINDINGS: Low lung volume. Elevated right hemidiaphragm. Bilateral lung fields are clear. Bilateral costophrenic angles are clear. Normal cardio-mediastinal silhouette. No acute osseous abnormalities. The soft tissues are within normal limits. IMPRESSION: No active disease. Electronically Signed   By: Jules Schick M.D.   On: 11/25/2023 09:33    Procedures Procedures    Medications Ordered in ED Medications  diltiazem (CARDIZEM) injection 15 mg (15 mg Intravenous Given 11/25/23 4034)    ED Course/ Medical Decision Making/ A&P Clinical Course as of 11/25/23 1404  Sat Nov 25, 2023  0939 Patient converted to a sinus rhythm with a heart rate in the 60s.  He is asymptomatic.  At this point I can prescribe diltiazem as a rescue medication but I would rather avoid putting him on rate control maintenance medications as he has borderline bradycardia and generally lower than normal blood pressure at baseline.  The patient is comfortable going home and has a cardiology follow-up in 2 days.  I do not believe he is needing initiation of anticoagulation given the very brief episode of atrial fibrillation today, and his overall low Vasc2 score [MT]    Clinical Course User Index [MT] Bradyn Vassey, Kermit Balo, MD                                 Medical Decision Making Amount and/or Complexity of Data Reviewed Labs:  ordered. Radiology: ordered. ECG/medicine tests: ordered.  Risk Prescription drug management.   This patient presents to the ED with concern for palpitations. This involves an extensive number of treatment options, and is a complaint that carries with it a high risk of complications and morbidity.  The differential diagnosis includes A-fib versus infection versus other  Co-morbidities that complicate the patient evaluation: Prior history of paroxysmal A-fib and age or risk factors for recurring A-fib  I suspect this most likely recurring A-fib, in the setting of a recent urinary tract infection.  The patient has not been anticoagulated but has a fairly clear timeline of onset of his A-fib within past 4 hours.  He is relatively healthy otherwise and has a CHADSVASC 2 score of 2 per age, with no hx of diabetes, HTN, stroke, or sig vascular disease/MI.  Additional history obtained from the patient's wife at bedside  External records from outside source obtained and reviewed including cardiology records reporting history of ablation in the past.  I ordered and personally interpreted labs.  The pertinent results include: No emergent findings  I ordered imaging studies including x-ray of the chest I independently visualized and interpreted imaging which showed no emergent findings I agree with the radiologist interpretation  The patient was maintained on a cardiac monitor.  I personally viewed and interpreted the cardiac monitored which showed an underlying rhythm of: A-fib with RVR  Per my interpretation the patient's ECG shows A-fib with RVR  I ordered medication including IV diltiazem bolus for heart rate control  I have reviewed the patients home medicines and have made adjustments as needed  Test Considered: I will lower suspicion for acute PE.  The patient has no hypoxia, no chest pain.  After the interventions noted above, I reevaluated the patient and found that they have:  improved  The patient converted to sinus rhythm after single dose of bolus of diltiazem.  Dispostion:  After consideration of the diagnostic results and the patients response to treatment, I feel that the patent would benefit from close outpatient follow-up         Final Clinical Impression(s) / ED Diagnoses Final diagnoses:  Atrial fib/flutter, transient (HCC)    Rx / DC Orders ED Discharge Orders          Ordered    diltiazem (CARDIZEM) 60 MG tablet  4 times daily PRN        11/25/23 0948              Terald Sleeper, MD 11/25/23 1404

## 2023-11-25 NOTE — ED Triage Notes (Signed)
 He reports feeling himself "go into a fib" at ~ 0500 today, which persists. Average heart rate ~150. Skin normal, warm and dry and he is pain-free and is breathing normally. His wife is with him.

## 2023-11-27 ENCOUNTER — Ambulatory Visit: Payer: PPO | Attending: Cardiology | Admitting: Cardiology

## 2023-11-27 VITALS — BP 116/80 | HR 72 | Resp 16 | Ht 69.0 in | Wt 175.0 lb

## 2023-11-27 DIAGNOSIS — Z8679 Personal history of other diseases of the circulatory system: Secondary | ICD-10-CM

## 2023-11-27 DIAGNOSIS — E7800 Pure hypercholesterolemia, unspecified: Secondary | ICD-10-CM

## 2023-11-27 DIAGNOSIS — I2510 Atherosclerotic heart disease of native coronary artery without angina pectoris: Secondary | ICD-10-CM

## 2023-11-27 DIAGNOSIS — I77810 Thoracic aortic ectasia: Secondary | ICD-10-CM

## 2023-11-27 DIAGNOSIS — I480 Paroxysmal atrial fibrillation: Secondary | ICD-10-CM | POA: Diagnosis not present

## 2023-11-27 DIAGNOSIS — R931 Abnormal findings on diagnostic imaging of heart and coronary circulation: Secondary | ICD-10-CM | POA: Diagnosis not present

## 2023-11-27 DIAGNOSIS — Z9889 Other specified postprocedural states: Secondary | ICD-10-CM

## 2023-11-27 DIAGNOSIS — I700 Atherosclerosis of aorta: Secondary | ICD-10-CM

## 2023-11-27 MED ORDER — ROSUVASTATIN CALCIUM 20 MG PO TABS: 20.0000 mg | ORAL_TABLET | Freq: Every day | ORAL | 3 refills | Status: AC

## 2023-11-27 NOTE — Patient Instructions (Signed)
 Medication Instructions:  Your physician recommends that you continue on your current medications as directed. Please refer to the Current Medication list given to you today.  Refill for Rosuvastatin (Crestor) sent to pharmacy.   *If you need a refill on your cardiac medications before your next appointment, please call your pharmacy*  Lab Work: None ordered today. If you have labs (blood work) drawn today and your tests are completely normal, you will receive your results only by: MyChart Message (if you have MyChart) OR A paper copy in the mail If you have any lab test that is abnormal or we need to change your treatment, we will call you to review the results.  Testing/Procedures: Your physician has requested that you have an echocardiogram to be completed in December 2025. Echocardiography is a painless test that uses sound waves to create images of your heart. It provides your doctor with information about the size and shape of your heart and how well your heart's chambers and valves are working. This procedure takes approximately one hour. There are no restrictions for this procedure. Please do NOT wear cologne, perfume, aftershave, or lotions (deodorant is allowed). Please arrive 15 minutes prior to your appointment time.  Please note: We ask at that you not bring children with you during ultrasound (echo/ vascular) testing. Due to room size and safety concerns, children are not allowed in the ultrasound rooms during exams. Our front office staff cannot provide observation of children in our lobby area while testing is being conducted. An adult accompanying a patient to their appointment will only be allowed in the ultrasound room at the discretion of the ultrasound technician under special circumstances. We apologize for any inconvenience.   Follow-Up: At [facility], you and your health needs are our priority.  As part of our continuing mission to provide you with exceptional heart  care, we have created designated Provider Care Teams.  These Care Teams include your primary Cardiologist (physician) and Advanced Practice Providers (APPs -  Physician Assistants and Nurse Practitioners) who all work together to provide you with the care you need, when you need it.  We recommend signing up for the patient portal called "MyChart".  Sign up information is provided on this After Visit Summary.  MyChart is used to connect with patients for Virtual Visits (Telemedicine).  Patients are able to view lab/test results, encounter notes, upcoming appointments, etc.  Non-urgent messages can be sent to your provider as well.   To learn more about what you can do with MyChart, go to [email or web address].    Your next appointment:   January 2026  The format for your next appointment:   In Person  Provider:   Tessa Lerner, DO {  Other Instructions   1st Floor: - Lobby - Registration  - Pharmacy  - Lab - Cafe  2nd Floor: - PV Lab - Diagnostic Testing (echo, CT, nuclear med)  3rd Floor: - Vacant  4th Floor: - TCTS (cardiothoracic surgery) - AFib Clinic - Structural Heart Clinic - Vascular Surgery  - Vascular Ultrasound  5th Floor: - [facility]Care Cardiology (general and EP) - Clinical Pharmacy for coumadin, hypertension, lipid, weight-loss medications, and med management appointments    Valet parking services will be available as well.

## 2023-11-27 NOTE — Progress Notes (Unsigned)
 Cardiology Office Note:    NAME:  [name] [name]    MRN: [id number] DOB:  Oct 10, 1940   PCP:  Dois Davenport, MD  Former Cardiology Providers: Dr. Sharon Seller, Dr. Jens Som Primary Cardiologist:  Tessa Lerner, DO, [facility] (established care 08/01/2023) Electrophysiologist:  None   Chief Complaint  Patient presents with   Coronary atherosclerosis due to calcified coronary lesion   Follow-up    History of Present Illness:    [name] [name] is a 83 y.o. 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   Patient was referred to the practice for preop risk stratification prior to his left knee replacement.  Given his shortness of breath with effort related activities which were more pronounced than his baseline shared decision was to proceed with ischemic workup prior to elective surgery.  Patient underwent coronary CTA which noted severe CAC and noted to have disease in both LAD and RCA distribution.  He eventually underwent left heart catheterization and was noted to have nonobstructive CAD.  Patient successfully underwent knee replacement and is currently recuperating/recovering.  Given the degree of CAC/CAD patient was agreeable to start statin therapy.  Most recent lipid profile notes a significant improvement in LDL levels which were initially 146 mg/dL and now 61 mg/dL.  Patient does not endorse myalgias.  Patient is accompanied by his wife at today's office visit and she also provides collateral history.    Postsurgery patient had  urinary tract infection for which she was treated with antibiotics and his last dose was on 11/25/2023.  However earlier that day he woke up at 5:30 AM and appreciated that he was in A-fib.  He went to the emergency room department at [facility] and was given IV Cardizem push and shortly thereafter he converted to sinus rhythm.  According to the patient and his wife he was likely in A-fib for at least 2 hours.  He has not had any reoccurrence.  He was given diltiazem to use on appearing basis.  Current Medications: Current Meds  Medication Sig   aspirin EC 81 MG tablet Take 81 mg by mouth daily. Swallow whole.   diltiazem (CARDIZEM) 60 MG tablet Take 1 tablet (60 mg total) by mouth 4 (four) times daily as needed for up to 30 doses. For heart rate above 100 beats per minute persistently while at rest   methocarbamol (ROBAXIN) 500 MG tablet Take 1 tablet (500 mg total) by mouth every 6 (six) hours as needed for muscle spasms.   [DISCONTINUED] rosuvastatin (CRESTOR) 20 MG tablet Take 1 tablet (20 mg total) by mouth at bedtime.   Current Facility-Administered Medications for the 11/27/23 encounter (Office Visit) with Tessa Lerner, DO  Medication   0.9 %  sodium chloride infusion     Allergies:    Patient has no known allergies.   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    Colon polyps    hyperplastic and tubular adenomatous   Coronary  atherosclerosis due to calcified coronary lesion    Diverticulosis    Dyspnea    Dysrhythmia    hx at fib- ablation 8 yrs ago   Erectile dysfunction following radical prostatectomy    Hypercapnic respiratory failure, chronic (HCC) 05/01/2015   Mediastinal mass    per CT CHEST 11/07/14 @ [facility].[name] KitchenANTERIOR...4.2 X 3.3 cm soft  tissue   Neuromuscular disorder (HCC)    Prostate cancer [facility])    prostate cancer   Radiation    for prostate cancer   Radiation proctitis    rectum   Recurrent laryngeal nerve palsy    Respiratory failure (HCC)    s/p phrenic nerve palsy    Thrombocytopenia (HCC)    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     Past Surgical History: Past Surgical History:  Procedure Laterality Date   ATRIAL ABLATION SURGERY     for a fib x 5 years ago   BACK SURGERY     cataract surgery      COLONOSCOPY     CYSTOSCOPY N/A 11/26/2014   Procedure: CYSTOSCOPY FLEXIBLE;  Surgeon: Alfredo Martinez, MD;  Location: [facility] OR;  Service: Urology;  Laterality: N/A;   CYSTOSCOPY WITH URETHRAL DILATATION N/A 11/26/2014   Procedure: CYSTOSCOPY WITH URETHRAL DILATATION WITH INSERTION OF FOLEY;  Surgeon: Alfredo Martinez, MD;  Location: [facility] OR;  Service: Urology;  Laterality: N/A;   FLEXIBLE SIGMOIDOSCOPY  02/10/2012   Procedure: FLEXIBLE SIGMOIDOSCOPY;  Surgeon: Hilarie Fredrickson, MD;  Location: [facility] ENDOSCOPY;  Service: Endoscopy;  Laterality: N/A;  need APC    KNEE ARTHROSCOPY Left    LEAD REMOVAL N/A 11/26/2014   Procedure: CRYO INTERCOSTAL NERVE BLOCK;  Surgeon: Loreli Slot, MD;  Location: [facility] OR;  Service: Thoracic;  Laterality: N/A;   LEFT HEART CATH AND CORONARY ANGIOGRAPHY N/A 09/29/2023   Procedure: LEFT HEART CATH AND CORONARY ANGIOGRAPHY;  Surgeon: Swaziland, Peter M, MD;  Location: [facility] INVASIVE CV LAB;  Service: Cardiovascular;  Laterality: N/A;   POLYPECTOMY     PROSTATE SURGERY     RESECTION OF MEDIASTINAL MASS N/A 11/26/2014   Procedure: RESECTION OF ANTERIOR MEDIASTINAL MASS;  Surgeon: Loreli Slot, MD;  Location: [facility] OR;  Service: Thoracic;  Laterality: N/A;   ROBOT ASSISTED LAPAROSCOPIC RADICAL PROSTATECTOMY  09/13/2007   radiation Tx 2011   TONSILLECTOMY     TOTAL KNEE ARTHROPLASTY Left 10/30/2023   Procedure: TOTAL KNEE ARTHROPLASTY;  Surgeon: Ollen Gross, MD;  Location: [facility]  ORS;  Service: Orthopedics;  Laterality: Left;   VASECTOMY     VIDEO ASSISTED THORACOSCOPY Left 11/26/2014   Procedure: VIDEO ASSISTED THORACOSCOPY;  Surgeon: Loreli Slot, MD;  Location: [facility] OR;  Service: Thoracic;  Laterality: Left;    Social History: Social History   Tobacco Use   Smoking status: Never   Smokeless tobacco: Never  Vaping Use   Vaping status: Never Used  Substance Use Topics   Alcohol use: No    Alcohol/week: 0.0 standard drinks of alcohol   Drug use: No    Family History: Family History  Problem Relation Age of Onset   Prostate cancer Father    Heart disease Mother    Heart attack Mother    Healthy Sister    Healthy Sister    Colon cancer Neg Hx    Esophageal cancer Neg Hx    Rectal cancer Neg Hx    Stomach cancer Neg Hx    Pancreatic cancer Neg Hx  ROS:   Review of Systems  Constitutional: Positive for malaise/fatigue.  Cardiovascular:  Positive for dyspnea on exertion (more noticable.). Negative for chest pain, claudication, irregular heartbeat, leg swelling, near-syncope, orthopnea, palpitations, paroxysmal nocturnal dyspnea and syncope.  Respiratory:  Negative for shortness of breath.   Hematologic/Lymphatic: Negative for bleeding problem.    EKGs/Labs/Other Studies Reviewed:   Echocardiogram December 2024: 09/11/2023  1. Left ventricular ejection fraction, by estimation, is 55 to 60%. The left ventricle has normal function. The left ventricle has no regional wall motion abnormalities. Left ventricular diastolic function could not be evaluated.  2. Right ventricular systolic function is normal. The right ventricular size is normal. There is normal pulmonary artery systolic pressure.  3. The mitral valve is normal in structure. No evidence of mitral valve regurgitation. No evidence of mitral stenosis.  4. The aortic valve is normal in structure. There is mild calcification of the aortic valve. Aortic valve regurgitation is not  visualized. No aortic stenosis is present.  5. Aortic dilatation noted. There is dilatation of the aortic root, measuring 41 mm. There is dilatation of the ascending aorta, measuring 39 mm.  6. The inferior vena cava is normal in size with greater than 50% respiratory variability, suggesting right atrial pressure of 3 mmHg.  Coronary CTA December 2024: 1. Coronary artery calcium score 3221 Agatston units. This suggests high risk for future cardiac events.   2. Moderate stenosis distal LAD, borderline hemodynamic significance by FFR.   3. Moderate stenosis distal RCA into bifurcation of PLV and PDA. Could not be assessed by FFR. Looks to be of borderline hemodynamic significance.  4.  Noncardiac findings: 1. Ill-defined areas of ground-glass within the left lower lobe, not seen on prior exam, may be infectious or inflammatory. This is not in a configuration typical of hypoventilatory change. 2. Elevated right hemidiaphragm with adjacent compressive atelectasis or scarring. 3. The right middle lobe nodule on prior PET is not confidently seen on the current exam. 4. Unchanged size of 4 mm posterior mediastinal node that was tracer avid on prior PET.   5. Aortic Atherosclerosis (ICD10-I70.0).  CT FFR December 2024: 1. Moderate stenosis distal LAD, borderline hemodynamic significance by FFR.   3. Moderate stenosis distal RCA into bifurcation of PLV and PDA. Could not be assessed by FFR. Looks to be of borderline hemodynamic significance.  Left heart cath and coronary angiography January 2025 1. Nonobstructive CAD 2. Normal LV function 3. Normal LVEDP  Risk Assessment/Calculations:   Click Here to Calculate/Change CHADS2VASc Score The patient's CHADS2-VASc score is 3, indicating a 3.2% annual risk of stroke.   CHF History: No HTN History: No Diabetes History: No Stroke History: No Vascular Disease History: Yes   Labs:    Latest Ref Rng & Units 11/25/2023    8:38 AM 11/01/2023     3:58 AM 10/31/2023    3:42 AM  CBC  WBC 4.0 - 10.5 K/uL 5.6  9.9  9.6   Hemoglobin 13.0 - 17.0 g/dL 14.7  9.6  82.9   Hematocrit 39.0 - 52.0 % 35.2  30.0  32.7   Platelets 150 - 400 K/uL 391  150  151        Latest Ref Rng & Units 11/25/2023    8:38 AM 10/31/2023    3:42 AM 09/15/2023    3:55 PM  BMP  Glucose 70 - 99 mg/dL 562  130  865   BUN 8 - 23 mg/dL 16  11  14    Creatinine  0.61 - 1.24 mg/dL 7.06  2.37  6.28   BUN/Creat Ratio 10 - 24   16   Sodium 135 - 145 mmol/L 136  133  140   Potassium 3.5 - 5.1 mmol/L 3.8  4.3  4.7   Chloride 98 - 111 mmol/L 100  99  98   CO2 22 - 32 mmol/L 25  26  24    Calcium 8.9 - 10.3 mg/dL 9.6  8.8  9.7       Latest Ref Rng & Units 11/25/2023    8:38 AM 10/31/2023    3:42 AM 09/15/2023    3:55 PM  CMP  Glucose 70 - 99 mg/dL 315  176  160   BUN 8 - 23 mg/dL 16  11  14    Creatinine 0.61 - 1.24 mg/dL 7.37  1.06  2.69   Sodium 135 - 145 mmol/L 136  133  140   Potassium 3.5 - 5.1 mmol/L 3.8  4.3  4.7   Chloride 98 - 111 mmol/L 100  99  98   CO2 22 - 32 mmol/L 25  26  24    Calcium 8.9 - 10.3 mg/dL 9.6  8.8  9.7   Total Protein 6.0 - 8.5 g/dL   7.1   Total Bilirubin 0.0 - 1.2 mg/dL   0.8   Alkaline Phos 44 - 121 IU/L   101   AST 0 - 40 IU/L   18   ALT 0 - 44 IU/L   18     Lab Results  Component Value Date   CHOL 134 11/15/2023   HDL 54 11/15/2023   LDLCALC 61 11/15/2023   LDLDIRECT 145 (H) 09/15/2023   TRIG 100 11/15/2023   CHOLHDL 2.5 11/15/2023   No results for input(s): "LIPOA" in the last 8760 hours. No components found for: "NTPROBNP" No results for input(s): "PROBNP" in the last 8760 hours. No results for input(s): "TSH" in the last 8760 hours.  Physical Exam:    Today's Vitals   11/27/23 1320  BP: 116/80  Pulse: 72  Resp: 16  SpO2: 98%  Weight: 175 lb (79.4 kg)  Height: 5\' 9"  (1.753 m)    Body mass index is 25.84 kg/m. Wt Readings from Last 3 Encounters:  11/27/23 175 lb (79.4 kg)  10/30/23 176 lb 5.9 oz (80 kg)   10/17/23 178 lb (80.7 kg)    Physical Exam  Constitutional: No distress.  hemodynamically stable  Neck: No JVD present.  Cardiovascular: Normal rate, regular rhythm, S1 normal and S2 normal. Exam reveals no gallop, no S3 and no S4.  No murmur heard. Pulmonary/Chest: Effort normal and breath sounds normal. No stridor. He has no wheezes. He has no rales.  Abdominal: Soft. Bowel sounds are normal. He exhibits no distension. There is no abdominal tenderness.  Musculoskeletal:        General: No edema.     Cervical back: Neck supple.  Neurological: He is alert and oriented to person, place, and time. He has intact cranial nerves (2-12).  Skin: Skin is warm.     Impression & Recommendation(s):  Impression:   ICD-10-CM   1. Nonobstructive atherosclerosis of coronary artery  I25.10 rosuvastatin (CRESTOR) 20 MG tablet    2. Agatston CAC score, >400  R93.1 rosuvastatin (CRESTOR) 20 MG tablet    3. Pure hypercholesterolemia  E78.00 rosuvastatin (CRESTOR) 20 MG tablet    4. Aortic atherosclerosis (HCC)  I70.0     5. Paroxysmal atrial fibrillation (HCC)  I48.0  6. S/P ablation of atrial fibrillation  Z98.890    Z86.79     7. Aortic root dilatation (HCC)  I77.810 ECHOCARDIOGRAM COMPLETE        Recommendation(s):  Coronary atherosclerosis due to calcified coronary lesion Agatston CAC score, >400 Total CAC is 3221 -consistent with severe CAC.  He at least has moderate CAD based on coronary CTA in the LAD and RCA distribution. Since last visit he underwent left heart catheterization and noted to have nonobstructive CAD. Refocusing on improving his modifiable cardiovascular risk factors. LDL levels initially were 782 mg/dL and given the severe CAC patient was agreeable to start statin therapy. Recent labs/11/15/2023 independently reviewed and LDL levels have improved to 61 mg/dL on current therapy. Overall functional capacity is currently limited due to recent postop  discomfort.  Pure hypercholesterolemia Aortic atherosclerosis Prior LDL was 146 mg/dL w/ medical therapy LDL is now 61 mg/dL Refill Crestor. Does not endorse myalgias.  Preoperative cardiovascular examination Tentatively scheduled to have a left knee replacement in February 2024 with Dr. Antony Odea.  Further recommendations to follow  Paroxysmal atrial fibrillation (HCC) S/P ablation of atrial fibrillation History of atrial fibrillation ablation approximately 16 years ago and since then has been in normal sinus rhythm. However on 11/25/2023 patient woke up in A-fib with RVR and went to ER for further evaluation and management.  Patient was given IV Cardizem push and his heart rate improved and he converted spontaneously to normal sinus rhythm.  By the time he got home was approximately 9:30 AM and patient/wife informs me that he may have been in A-fib for close to 2 hours.   Given the duration of his atrial fibrillation with RVR and CHA2DS2-VASc score recommended anticoagulation for 4 weeks if cleared by orthopedic surgery.  Patient states that he would like to discuss it further with his close friend and former cardiologist prior to making a decision.  Prior to the completion of the note patient reached out stating that he wants to hold off on anticoagulation. Recommend continuation of aspirin 81 mg p.o. daily. We also discussed a cardiac monitor to evaluate for A-fib burden given the fact that he is in a postoperative state, feels more tired and fatigued, recently had a UTI infection, and likely is at a high risk of recurrence.  However, patient would like to hold off on cardiac monitor at this time as well. Patient is aware of his CHA2DS2-VASc SCORE and chooses not to be on anticoagulation due to risk/benefit ratio and he is aware to seek medical attention if he has symptoms to suggest either TIA or CVA. We also discussed considering implantable loop recorder for long-term monitoring for A-fib  burden but he would like to hold off for now as well. Patient was given Cardizem 60 mg p.o. 3 times daily on as needed basis, he has not required the use of the medication for rate control strategy  Aortic root dilatation: Noted incidentally on echocardiogram 09/11/2023. Aortic root was measured to be 41 mm and proximal ascending aorta at 39 mm. Recommend follow-up echocardiogram in 1 year, December 2025 to reevaluate disease progression.  Patient is aware of keeping his blood pressure is well-controlled.  Currently not on medical therapy.  Orders Placed:  Orders Placed This Encounter  Procedures   ECHOCARDIOGRAM COMPLETE    Standing Status:   Future    Expected Date:   08/12/2024    Expiration Date:   11/26/2024    Where should this test be performed:   [facility])    Does the patient weigh less than or greater than 250 lbs?:   Patient weighs less than 250 lbs    Perflutren DEFINITY (image enhancing agent) should be administered unless hypersensitivity or allergy exist:   Administer Perflutren    Reason for exam-Echo:   Other-Full Diagnosis List    Full ICD-10/Reason for Exam:   Aortic root dilatation (HCC) [322010]    Final Medication List:    Meds ordered this encounter  Medications   rosuvastatin (CRESTOR) 20 MG tablet    Sig: Take 1 tablet (20 mg total) by mouth at bedtime.    Dispense:  90 tablet    Refill:  3    Medications Discontinued During This Encounter  Medication Reason   rosuvastatin (CRESTOR) 20 MG tablet Reorder      Current Outpatient Medications:    aspirin EC 81 MG tablet, Take 81 mg by mouth daily. Swallow whole., Disp: , Rfl:    diltiazem (CARDIZEM) 60 MG tablet, Take 1 tablet (60 mg total) by mouth 4 (four) times daily as needed for up to 30 doses. For heart rate above 100 beats per minute persistently while at rest, Disp: 30 tablet, Rfl: 0   methocarbamol (ROBAXIN) 500 MG tablet, Take 1 tablet (500 mg total) by mouth every 6 (six)  hours as needed for muscle spasms., Disp: 40 tablet, Rfl: 0   ondansetron (ZOFRAN) 4 MG tablet, Take 1 tablet (4 mg total) by mouth every 6 (six) hours as needed for nausea. (Patient not taking: Reported on 11/27/2023), Disp: 20 tablet, Rfl: 0   rosuvastatin (CRESTOR) 20 MG tablet, Take 1 tablet (20 mg total) by mouth at bedtime., Disp: 90 tablet, Rfl: 3   traMADol (ULTRAM) 50 MG tablet, Take 1-2 tablets (50-100 mg total) by mouth every 6 (six) hours as needed for moderate pain (pain score 4-6). (Patient not taking: Reported on 11/27/2023), Disp: 40 tablet, Rfl: 0  Current Facility-Administered Medications:    0.9 %  sodium chloride infusion, 500 mL, Intravenous, Continuous, Hilarie Fredrickson, MD  Consent:   Informed Consent   Shared Decision Making/Informed Consent The risks [stroke (1 in 1000), death (1 in 1000), kidney failure [usually temporary] (1 in 500), bleeding (1 in 200), allergic reaction [possibly serious] (1 in 200)], benefits (diagnostic support and management of coronary artery disease) and alternatives of a cardiac catheterization were discussed in detail with Mr. Mochizuki and he is willing to proceed.     Disposition:   January 2026  His questions and concerns were addressed to his satisfaction. He voices understanding of the recommendations provided during this encounter.    Signed, Tessa Lerner, DO, [facility]  [facility]  [address] [place], [place] [address]

## 2023-11-28 ENCOUNTER — Encounter: Payer: Self-pay | Admitting: Cardiology

## 2023-11-28 ENCOUNTER — Telehealth: Payer: Self-pay | Admitting: Cardiology

## 2023-11-28 DIAGNOSIS — I480 Paroxysmal atrial fibrillation: Secondary | ICD-10-CM

## 2023-11-28 NOTE — Telephone Encounter (Signed)
 Pt called in stating he spoke to a couple people and changed his mind. He would like to give the blood thinner a try for a month. Please advise.

## 2023-11-28 NOTE — Telephone Encounter (Signed)
 Harlow Ohms,  Can you verify this information as its different than what we spoke about this morning.   Also, prior to calling Mr. Dayal reach out to Dr. Tana Felts office due to recent surgery and ask if he is stable from their standpoint to be on Eliquis 5mg  po bid.   Jadalyn Oliveri Ashley, DO, [facility]

## 2023-11-28 NOTE — Telephone Encounter (Signed)
 Okay, Thanks for the update.   Tacuma Graffam [place], DO, [facility]

## 2023-11-28 NOTE — Telephone Encounter (Signed)
 New Message:     Patient wants Dr Odis Hollingshead to know at this time he have decided not to go on the blood thinner.

## 2023-11-29 ENCOUNTER — Encounter: Payer: Self-pay | Admitting: Cardiology

## 2023-11-29 ENCOUNTER — Telehealth: Payer: Self-pay | Admitting: Cardiology

## 2023-11-29 ENCOUNTER — Ambulatory Visit: Attending: Cardiology

## 2023-11-29 DIAGNOSIS — I480 Paroxysmal atrial fibrillation: Secondary | ICD-10-CM

## 2023-11-29 MED ORDER — APIXABAN 5 MG PO TABS: 5.0000 mg | ORAL_TABLET | Freq: Two times a day (BID) | ORAL | 3 refills | Status: DC

## 2023-11-29 NOTE — Telephone Encounter (Signed)
 Patient is returning call.

## 2023-11-29 NOTE — Telephone Encounter (Signed)
 Spoke with pt and pt stated he would like to continue forward with the cardiac monitor that was discussed at his last office visit with Dr. Odis Hollingshead as well as move forward with starting Eliquis.   Order for zio 14 days has been ordered and Eliquis 5 mg BID has been sent to pt preferred pharmacy. Pt verbalized understanding to plan of care and has no further questions at this time.

## 2023-11-29 NOTE — Telephone Encounter (Signed)
 Attempted to call pt to discuss him deciding to start Eliquis. Also calling to get clarification from pt about his call earlier asking about surgery. LMTCB.

## 2023-11-29 NOTE — Telephone Encounter (Signed)
 Spoke with pt and advised him to stop aspirin 81 mg when he begins taking Eliquis. Pt verbalized understanding and had no further questions at this time.

## 2023-11-29 NOTE — Telephone Encounter (Signed)
 Refer to telephone encounter from 3/18.

## 2023-11-29 NOTE — Telephone Encounter (Signed)
 Pt would like a c/b from nurse regarding surgery. Please advise

## 2023-11-29 NOTE — Telephone Encounter (Signed)
 Dr. Lequita Halt returned RN Peyton's call and stated it would be fine for the patient to start Eliquis.

## 2023-11-29 NOTE — Telephone Encounter (Signed)
 Spoke with a nurse in [facility] triage and she took a message from our office to give to Dr. Lequita Halt asking if the pt is safe to start Eliquis 5 mg BID since he recently had surgery. Waiting for call back.

## 2023-11-29 NOTE — Progress Notes (Unsigned)
 Enrolled patient for a 14 day Zio XT  monitor to be mailed to patients home

## 2023-11-29 NOTE — Addendum Note (Signed)
 Addended by: Cherylann Banas on: 11/29/2023 04:33 PM   Modules accepted: Orders

## 2023-11-29 NOTE — Addendum Note (Signed)
 Addended by: Cherylann Banas on: 11/29/2023 05:17 PM   Modules accepted: Orders

## 2023-11-30 ENCOUNTER — Telehealth: Payer: Self-pay | Admitting: Cardiology

## 2023-11-30 NOTE — Telephone Encounter (Signed)
 Spoke with patient, currently feels as if he is back in to NSR and asymptomatic. Patient states he went into AF last night around midnight with heart rates in the 120's, had taken diltiazem as prescribed and heart rate lowered below 100 and then patient converted to NSR within a couple of hours. Confirmed patient is taking eliquis as prescribed, just started this yesterday. Educated patient on diltiazem being for rate control and monitoring blood pressure while taking, hadn't taken BP yet today but felt better and states will take it later when available. Answered all other questions. No further needs

## 2023-11-30 NOTE — Telephone Encounter (Signed)
 Patient c/o Palpitations: STAT if patient c/o lightheadedness, shortness of breath, or chest pain  How long have you had palpitations/irregular HR/ Afib? Are you having the symptoms now?  Patient went into afib around 12:00 AM, took Diltiazem and afib went away within 2 hours  Are you currently experiencing lightheadedness, SOB or CP?  Not currently  Do you have a history of afib (atrial fibrillation) or irregular heart rhythm?  Yes   Have you checked your BP or HR? (document readings if available):  HR is normal, 72 Hasn't checked BP  Are you experiencing any other symptoms?  Mild dizziness, not faint

## 2023-12-11 ENCOUNTER — Other Ambulatory Visit: Payer: Self-pay | Admitting: Family Medicine

## 2023-12-11 DIAGNOSIS — R7989 Other specified abnormal findings of blood chemistry: Secondary | ICD-10-CM

## 2023-12-26 ENCOUNTER — Telehealth: Payer: Self-pay | Admitting: Cardiology

## 2023-12-26 NOTE — Telephone Encounter (Signed)
 STAT if HR is under 50 or over 120  (normal HR is 60-100 beats per minute)  What is your heart rate? 95 right now   Do you have a log of your heart rate readings (document readings)? No   Do you have any other symptoms? No    Call after 3pm

## 2023-12-26 NOTE — Telephone Encounter (Signed)
 Spoke with pt over the phone. Pt stated he has been having episodes of afib. HR 93 at time of phone call. Pt states his energy is low. After walking up stairs, the pt gets SOB. Pt has been working out on a stationary bike and lifting weights trying to get back into shape. Pt states that he has been having more frequent episodes of going into afib than usual and would like to be seen. Scheduled pt for tomorrow 4/16 at 4:20 PM.

## 2023-12-26 NOTE — Telephone Encounter (Signed)
 Advised pt to monitor his HR and if it gets above 100 to take his PRN Diltiazem that he had been prescribed. Pt verbalized understanding of plan.

## 2023-12-26 NOTE — Telephone Encounter (Signed)
 Spoke with patient and he asked can we call him back

## 2023-12-26 NOTE — Telephone Encounter (Signed)
 Pt returning call, requesting cb

## 2023-12-27 ENCOUNTER — Ambulatory Visit: Attending: Cardiology | Admitting: Cardiology

## 2023-12-27 ENCOUNTER — Encounter: Payer: Self-pay | Admitting: Cardiology

## 2023-12-27 VITALS — BP 123/71 | HR 72 | Resp 16 | Ht 69.0 in | Wt 175.0 lb

## 2023-12-27 DIAGNOSIS — R931 Abnormal findings on diagnostic imaging of heart and coronary circulation: Secondary | ICD-10-CM | POA: Diagnosis not present

## 2023-12-27 DIAGNOSIS — I480 Paroxysmal atrial fibrillation: Secondary | ICD-10-CM

## 2023-12-27 DIAGNOSIS — I700 Atherosclerosis of aorta: Secondary | ICD-10-CM

## 2023-12-27 DIAGNOSIS — Z9889 Other specified postprocedural states: Secondary | ICD-10-CM

## 2023-12-27 DIAGNOSIS — Z8679 Personal history of other diseases of the circulatory system: Secondary | ICD-10-CM

## 2023-12-27 DIAGNOSIS — E7800 Pure hypercholesterolemia, unspecified: Secondary | ICD-10-CM

## 2023-12-27 DIAGNOSIS — I2510 Atherosclerotic heart disease of native coronary artery without angina pectoris: Secondary | ICD-10-CM

## 2023-12-27 DIAGNOSIS — I77810 Thoracic aortic ectasia: Secondary | ICD-10-CM

## 2023-12-27 MED ORDER — DILTIAZEM HCL ER COATED BEADS 120 MG PO CP24: 120.0000 mg | ORAL_CAPSULE | Freq: Every day | ORAL | 3 refills | Status: DC

## 2023-12-27 NOTE — Progress Notes (Signed)
 Cardiology Office Note:    NAME:  [name] [name]    MRN: [id number] DOB:  Dec 16, 1940   PCP:  Dois Davenport, MD  Former Cardiology Providers: Dr. Sharon Seller, Dr. Jens Som Primary Cardiologist:  Tessa Lerner, DO, [facility] (established care 08/01/2023) Electrophysiologist:  None   Chief Complaint  Patient presents with   Atrial Fibrillation   Follow-up    History of Present Illness:    [name] [name] is a 83 y.o. 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   Patient was referred to the practice for preop risk stratification prior to his left knee replacement.  Given his shortness of breath with effort related activities which were more pronounced than his baseline shared decision was to proceed with ischemic workup prior to elective surgery.  Patient underwent coronary CTA which noted severe CAC and noted to have disease in both LAD and RCA distribution.  He eventually underwent left heart catheterization and was noted to have nonobstructive CAD.  Patient successfully underwent knee replacement.  Postsurgery patient had a urinary tract infection was treated with antibiotics.  On November 25, 2023 he woke up at approximately 5:30 AM and appreciated that he was in atrial fibrillation.  He went to the ER and was given IV Cardizem push and shortly thereafter converted to sinus rhythm.  On estimation he was probably in A-fib for approximately 2 hours.  At the last office visit patient was given short acting Cardizem to use on as needed basis.  We  discussed having a cardiac monitor to evaluate for A-fib burden and because he was in A-fib with RVR and his CHA2DS2-VASc score 2 treated with anticoagulation for at least 4 weeks until he is asymptomatic.  Patient presents today for reason due to increased frequency of A-fib episodes.  He is monitoring A-fib episodes with smart watch technology as well as a pulse ox.  According to patient's wife Rene Kocher who was present over speaker phone patient in atrial fibrillation at least at 5 separate episodes over the last 1 month.  While he was wearing the monitor he probably had 2 episodes of A-fib.  The monitor results are still pending.  When he is in A-fib he has worsening shortness of breath and feels tired and fatigue.  Patient states that he is probably at 40% of his energy level.  Denies heart failure symptoms, nursing or syncopal events.  In the interim he did have a CT PE study at [facility] which was negative for pulmonary embolism, results available in Care Everywhere  Current Medications: Current Meds  Medication Sig   apixaban (ELIQUIS) 5 MG TABS tablet Take 1 tablet (5 mg total) by mouth 2 (two) times daily.   diltiazem (CARDIZEM CD) 120 MG 24 hr capsule Take 1 capsule (120 mg total) by mouth daily.   diltiazem (CARDIZEM) 60 MG tablet Take 1 tablet (60 mg total) by mouth 4 (four) times daily as needed for up to 30 doses. For heart rate above 100 beats per minute persistently while at rest   methocarbamol (ROBAXIN) 500 MG tablet Take 1 tablet (500 mg total) by mouth every 6 (six) hours as needed for muscle spasms.  rosuvastatin (CRESTOR) 20 MG tablet Take 1 tablet (20 mg total) by mouth at bedtime.   traMADol (ULTRAM) 50 MG tablet Take 1-2 tablets (50-100 mg total) by mouth every 6 (six) hours as needed for moderate pain (pain score 4-6).   Current Facility-Administered Medications for the 12/27/23 encounter (Office Visit) with Jenessa Gillingham, DO  Medication   0.9 %  sodium chloride infusion      Allergies:    Patient has no known allergies.   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    Colon polyps    hyperplastic and tubular adenomatous   Coronary atherosclerosis due to calcified coronary lesion    Diverticulosis    Dyspnea    Dysrhythmia    hx at fib- ablation 8 yrs ago   Erectile dysfunction following radical prostatectomy    Hypercapnic respiratory failure, chronic (HCC) 05/01/2015   Mediastinal mass    per CT CHEST 11/07/14 @ [facility].Aaron AasANTERIOR...4.2 X 3.3 cm soft tissue   Neuromuscular disorder (HCC)    Prostate cancer [facility])    prostate cancer   Radiation    for prostate cancer   Radiation proctitis    rectum   Recurrent laryngeal nerve palsy    Respiratory failure (HCC)    s/p phrenic nerve palsy    Thrombocytopenia (HCC)    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     Past Surgical History: Past Surgical History:  Procedure Laterality Date   ATRIAL ABLATION SURGERY     for a fib x 5 years ago   BACK SURGERY     cataract surgery      COLONOSCOPY     CYSTOSCOPY N/A 11/26/2014   Procedure: CYSTOSCOPY FLEXIBLE;  Surgeon: Erman Hayward, MD;  Location: [facility] OR;  Service: Urology;  Laterality: N/A;   CYSTOSCOPY WITH URETHRAL DILATATION N/A 11/26/2014   Procedure: CYSTOSCOPY WITH URETHRAL DILATATION WITH INSERTION OF FOLEY;  Surgeon: Erman Hayward, MD;  Location: [facility] OR;  Service: Urology;  Laterality: N/A;   FLEXIBLE SIGMOIDOSCOPY  02/10/2012   Procedure: FLEXIBLE SIGMOIDOSCOPY;  Surgeon: Tobin Forts, MD;  Location: [facility];  Service: Endoscopy;  Laterality: N/A;  need APC    KNEE ARTHROSCOPY Left    LEAD REMOVAL N/A 11/26/2014   Procedure: CRYO INTERCOSTAL NERVE BLOCK;  Surgeon: Zelphia Higashi, MD;   Location: [facility] OR;  Service: Thoracic;  Laterality: N/A;   LEFT HEART CATH AND CORONARY ANGIOGRAPHY N/A 09/29/2023   Procedure: LEFT HEART CATH AND CORONARY ANGIOGRAPHY;  Surgeon: Swaziland, Peter M, MD;  Location: [facility] INVASIVE CV LAB;  Service: Cardiovascular;  Laterality: N/A;   POLYPECTOMY     PROSTATE SURGERY     RESECTION OF MEDIASTINAL MASS N/A 11/26/2014   Procedure: RESECTION OF ANTERIOR MEDIASTINAL MASS;  Surgeon: Zelphia Higashi, MD;  Location: [facility] OR;  Service: Thoracic;  Laterality: N/A;   ROBOT ASSISTED LAPAROSCOPIC RADICAL PROSTATECTOMY  09/13/2007   radiation Tx 2011   TONSILLECTOMY     TOTAL KNEE ARTHROPLASTY Left 10/30/2023   Procedure: TOTAL KNEE ARTHROPLASTY;  Surgeon: Liliane Rei, MD;  Location: [facility] ORS;  Service: Orthopedics;  Laterality: Left;   VASECTOMY     VIDEO ASSISTED THORACOSCOPY Left 11/26/2014   Procedure: VIDEO ASSISTED THORACOSCOPY;  Surgeon: Zelphia Higashi, MD;  Location:  [facility] OR;  Service: Thoracic;  Laterality: Left;    Social History: Social History   Tobacco Use   Smoking status: Never   Smokeless tobacco: Never  Vaping Use   Vaping status: Never Used  Substance Use Topics   Alcohol use: No    Alcohol/week: 0.0 standard drinks of alcohol   Drug use: No    Family History: Family History  Problem Relation Age of Onset   Prostate cancer Father    Heart disease Mother    Heart attack Mother    Healthy Sister    Healthy Sister    Colon cancer Neg Hx    Esophageal cancer Neg Hx    Rectal cancer Neg Hx    Stomach cancer Neg Hx    Pancreatic cancer Neg Hx     ROS:   Review of Systems  Constitutional: Positive for malaise/fatigue.  Cardiovascular:  Negative for chest pain, claudication, irregular heartbeat, leg swelling, near-syncope, orthopnea, palpitations, paroxysmal nocturnal dyspnea and syncope.  Respiratory:  Positive for cough. Negative for shortness of breath.   Hematologic/Lymphatic: Negative for bleeding problem.     EKGs/Labs/Other Studies Reviewed:   Echocardiogram December 2024: 09/11/2023  1. Left ventricular ejection fraction, by estimation, is 55 to 60%. The left ventricle has normal function. The left ventricle has no regional wall motion abnormalities. Left ventricular diastolic function could not be evaluated.  2. Right ventricular systolic function is normal. The right ventricular size is normal. There is normal pulmonary artery systolic pressure.  3. The mitral valve is normal in structure. No evidence of mitral valve regurgitation. No evidence of mitral stenosis.  4. The aortic valve is normal in structure. There is mild calcification of the aortic valve. Aortic valve regurgitation is not visualized. No aortic stenosis is present.  5. Aortic dilatation noted. There is dilatation of the aortic root, measuring 41 mm. There is dilatation of the ascending aorta, measuring 39 mm.  6. The inferior vena cava is normal in size with greater than 50% respiratory variability, suggesting right atrial pressure of 3 mmHg.  Coronary CTA December 2024: 1. Coronary artery calcium score 3221 Agatston units. This suggests high risk for future cardiac events.   2. Moderate stenosis distal LAD, borderline hemodynamic significance by FFR.   3. Moderate stenosis distal RCA into bifurcation of PLV and PDA. Could not be assessed by FFR. Looks to be of borderline hemodynamic significance.  4.  Noncardiac findings: 1. Ill-defined areas of ground-glass within the left lower lobe, not seen on prior exam, may be infectious or inflammatory. This is not in a configuration typical of hypoventilatory change. 2. Elevated right hemidiaphragm with adjacent compressive atelectasis or scarring. 3. The right middle lobe nodule on prior PET is not confidently seen on the current exam. 4. Unchanged size of 4 mm posterior mediastinal node that was tracer avid on prior PET.   5. Aortic Atherosclerosis (ICD10-I70.0).  CT FFR  December 2024: 1. Moderate stenosis distal LAD, borderline hemodynamic significance by FFR.   3. Moderate stenosis distal RCA into bifurcation of PLV and PDA. Could not be assessed by FFR. Looks to be of borderline hemodynamic significance.  Left heart cath and coronary angiography January 2025 1. Nonobstructive CAD 2. Normal LV function 3. Normal LVEDP  Risk Assessment/Calculations:   Click Here to Calculate/Change CHADS2VASc Score The patient's CHADS2-VASc score is 3, indicating a 3.2% annual risk of stroke.   CHF History: No HTN History: No Diabetes History: No Stroke History: No Vascular Disease History: Yes  Labs:    Latest Ref Rng & Units 11/25/2023    8:38 AM 11/01/2023    3:58 AM 10/31/2023    3:42 AM  CBC  WBC 4.0 - 10.5 K/uL 5.6  9.9  9.6   Hemoglobin 13.0 - 17.0 g/dL 78.2  9.6  95.6   Hematocrit 39.0 - 52.0 % 35.2  30.0  32.7   Platelets 150 - 400 K/uL 391  150  151        Latest Ref Rng & Units 11/25/2023    8:38 AM 10/31/2023    3:42 AM 09/15/2023    3:55 PM  BMP  Glucose 70 - 99 mg/dL 213  086  578   BUN 8 - 23 mg/dL 16  11  14    Creatinine 0.61 - 1.24 mg/dL 4.69  6.29  5.28   BUN/Creat Ratio 10 - 24   16   Sodium 135 - 145 mmol/L 136  133  140   Potassium 3.5 - 5.1 mmol/L 3.8  4.3  4.7   Chloride 98 - 111 mmol/L 100  99  98   CO2 22 - 32 mmol/L 25  26  24    Calcium 8.9 - 10.3 mg/dL 9.6  8.8  9.7       Latest Ref Rng & Units 11/25/2023    8:38 AM 10/31/2023    3:42 AM 09/15/2023    3:55 PM  CMP  Glucose 70 - 99 mg/dL 413  244  010   BUN 8 - 23 mg/dL 16  11  14    Creatinine 0.61 - 1.24 mg/dL 2.72  5.36  6.44   Sodium 135 - 145 mmol/L 136  133  140   Potassium 3.5 - 5.1 mmol/L 3.8  4.3  4.7   Chloride 98 - 111 mmol/L 100  99  98   CO2 22 - 32 mmol/L 25  26  24    Calcium 8.9 - 10.3 mg/dL 9.6  8.8  9.7   Total Protein 6.0 - 8.5 g/dL   7.1   Total Bilirubin 0.0 - 1.2 mg/dL   0.8   Alkaline Phos 44 - 121 IU/L   101   AST 0 - 40 IU/L   18   ALT 0 - 44  IU/L   18     Lab Results  Component Value Date   CHOL 134 11/15/2023   HDL 54 11/15/2023   LDLCALC 61 11/15/2023   LDLDIRECT 145 (H) 09/15/2023   TRIG 100 11/15/2023   CHOLHDL 2.5 11/15/2023   No results for input(s): "LIPOA" in the last 8760 hours. No components found for: "NTPROBNP" No results for input(s): "PROBNP" in the last 8760 hours. No results for input(s): "TSH" in the last 8760 hours.  Physical Exam:    Today's Vitals   12/27/23 1632  BP: 123/71  Pulse: 72  Resp: 16  SpO2: 96%  Weight: 175 lb (79.4 kg)  Height: 5\' 9"  (1.753 m)    Body mass index is 25.84 kg/m. Wt Readings from Last 3 Encounters:  12/27/23 175 lb (79.4 kg)  11/27/23 175 lb (79.4 kg)  10/30/23 176 lb 5.9 oz (80 kg)    Physical Exam  Constitutional: No distress.  hemodynamically stable  Neck: No JVD present.  Cardiovascular: Normal rate, regular rhythm, S1 normal and S2 normal. Exam reveals no gallop, no S3 and no S4.  No murmur heard. Pulmonary/Chest: Effort normal and breath sounds normal. No stridor. He has no wheezes. He has no rales.  Abdominal: Soft. Bowel sounds are normal. He exhibits no distension. There is no abdominal tenderness.  Musculoskeletal:        General: No edema.     Cervical back: Neck supple.  Neurological: He is alert and oriented to person, place, and time. He has intact cranial nerves (2-12).  Skin: Skin is warm.     Impression & Recommendation(s):  Impression:   ICD-10-CM   1. Paroxysmal atrial fibrillation (HCC)  I48.0 EKG 12-Lead    2. S/P ablation of atrial fibrillation  Z98.890    Z86.79     3. Nonobstructive atherosclerosis of coronary artery  I25.10     4. Aortic atherosclerosis (HCC)  I70.0     5. Agatston CAC score, >400  R93.1     6. Pure hypercholesterolemia  E78.00     7. Aortic root dilatation (HCC)  I77.810         Recommendation(s):  Paroxysmal atrial fibrillation (HCC) S/P ablation of atrial fibrillation History of atrial  fibrillation ablation approximately 16 years ago and since then has been in normal sinus rhythm. However on 11/25/2023 patient woke up in A-fib with RVR and went to ER for further evaluation and management.  Patient was given IV Cardizem push and his heart rate improved and he converted spontaneously to normal sinus rhythm.  By the time he got home was approximately 9:30 AM and patient/wife informs me that he may have been in A-fib for close to 2 hours. Currently taking diltiazem 60 mg p.o. as needed basis for paroxysmal episodes of A-fib.  He has had 4-5 doses over the last 1 month. Currently patient is in sinus rhythm Shared decision was to start Cardizem 120 mg p.o. daily & continue to use short acting diltiazem on as needed basis as long as SBP is >100 mmHg.  If he is in A-fib with RVR with hypotension he is advised not to use short acting diltiazem but go to the closest ER via EMS for further evaluation and management. Will continue oral anticoagulation with Eliquis for thromboembolic prophylaxis given his CHA2DS2-VASc score Cardiac monitor results are pending, monitor was sent in last week.  Will await the results I am hopeful that with rate control strategy he will have decreased burden of A-fib; however, if he continues to have increased burden of A-fib will need to uptitrate AV nodal blocking agents for symptom relief.  Other options could be to consider antiarrhythmic medications EP consult for consideration of A-fib ablation again. Patient does not endorse evidence of bleeding. Risks, benefits, and alternatives to anticoagulation were discussed. Further recommendations to follow.  Nonobstructive atherosclerosis of coronary artery Aortic atherosclerosis (HCC) Agatston CAC score, >400 Total CAC is 3221 -consistent with severe CAC.  He at least has moderate CAD based on coronary CTA in the LAD and RCA distribution. Heart catheterization: nonobstructive CAD. Refocusing on improving his  modifiable cardiovascular risk factors. LDL levels initially were 756 mg/dL and given the severe CAC patient was agreeable to start statin therapy. Recent labs 11/15/2023 independently reviewed and LDL levels have improved to 61 mg/dL on current therapy. Monitor for now.  Pure hypercholesterolemia Prior LDL levels 146 mg/dL. Recommended statin therapy his severe CAC Follow-up lipids have noted LDL to be 61 mg/dL. Continue Crestor 20 mg p.o. nightly  Aortic root dilatation: Noted incidentally on echocardiogram 09/11/2023. Aortic root was measured to be 41 mm and proximal ascending aorta at 39 mm. Recommend follow-up echocardiogram in 1 year, December 2025 to reevaluate disease progression. Patient is  aware of keeping a log of his blood pressures to make sure further medication titration is not warranted. If medications are warranted recommend ARB.  Discussed management of at least 2 chronic comorbid conditions, EKG ordered and independently reviewed, outside imaging studies reviewed from [facility] CT PE protocol study December 12, 2023, medication reconciliation and titration, plan of care discussed with patient and wife who is on  speakerphone.   Orders Placed:  Orders Placed This Encounter  Procedures   EKG 12-Lead    Final Medication List:    Meds ordered this encounter  Medications   diltiazem (CARDIZEM CD) 120 MG 24 hr capsule    Sig: Take 1 capsule (120 mg total) by mouth daily.    Dispense:  30 capsule    Refill:  3    There are no discontinued medications.     Current Outpatient Medications:    apixaban (ELIQUIS) 5 MG TABS tablet, Take 1 tablet (5 mg total) by mouth 2 (two) times daily., Disp: 60 tablet, Rfl: 3   diltiazem (CARDIZEM CD) 120 MG 24 hr capsule, Take 1 capsule (120 mg total) by mouth daily., Disp: 30 capsule, Rfl: 3   diltiazem (CARDIZEM) 60 MG tablet, Take 1 tablet (60 mg total) by mouth 4 (four) times daily as needed for up to 30 doses. For heart rate  above 100 beats per minute persistently while at rest, Disp: 30 tablet, Rfl: 0   methocarbamol (ROBAXIN) 500 MG tablet, Take 1 tablet (500 mg total) by mouth every 6 (six) hours as needed for muscle spasms., Disp: 40 tablet, Rfl: 0   rosuvastatin (CRESTOR) 20 MG tablet, Take 1 tablet (20 mg total) by mouth at bedtime., Disp: 90 tablet, Rfl: 3   traMADol (ULTRAM) 50 MG tablet, Take 1-2 tablets (50-100 mg total) by mouth every 6 (six) hours as needed for moderate pain (pain score 4-6)., Disp: 40 tablet, Rfl: 0  Current Facility-Administered Medications:    0.9 %  sodium chloride infusion, 500 mL, Intravenous, Continuous, Tobin Forts, MD  Consent:    NA   Disposition:   64-month follow-up sooner if needed. His questions and concerns were addressed to his satisfaction. He voices understanding of the recommendations provided during this encounter.    Signed, Olinda Bertrand, DO, [facility] [place]  [facility]  [address] [place], [place] [address]

## 2023-12-27 NOTE — Patient Instructions (Signed)
 Medication Instructions:  Your physician has recommended you make the following change in your medication:   START Diltiazem (Cardizem) 120 mg once daily in the morning   *If you need a refill on your cardiac medications before your next appointment, please call your pharmacy*  Lab Work: None ordered today. If you have labs (blood work) drawn today and your tests are completely normal, you will receive your results only by: MyChart Message (if you have MyChart) OR A paper copy in the mail If you have any lab test that is abnormal or we need to change your treatment, we will call you to review the results.  Testing/Procedures: None ordered today.  Follow-Up: At [facility], you and your health needs are our priority.  As part of our continuing mission to provide you with exceptional heart care, we have created designated Provider Care Teams.  These Care Teams include your primary Cardiologist (physician) and Advanced Practice Providers (APPs -  Physician Assistants and Nurse Practitioners) who all work together to provide you with the care you need, when you need it.  We recommend signing up for the patient portal called "MyChart".  Sign up information is provided on this After Visit Summary.  MyChart is used to connect with patients for Virtual Visits (Telemedicine).  Patients are able to view lab/test results, encounter notes, upcoming appointments, etc.  Non-urgent messages can be sent to your provider as well.   To learn more about what you can do with MyChart, go to [email or web address].    Your next appointment:   3 month(s)  The format for your next appointment:   In Person  Provider:   Olinda Bertrand, [facility]  Other Instructions   1st Floor: - Lobby - Registration  - Pharmacy  - Lab - Cafe  2nd Floor: - PV Lab - Diagnostic Testing (echo, CT, nuclear med)  3rd Floor: - Vacant  4th Floor: - TCTS (cardiothoracic surgery) - AFib Clinic - Structural Heart  Clinic - Vascular Surgery  - Vascular Ultrasound  5th Floor: - HeartCare Cardiology (general and EP) - Clinical Pharmacy for coumadin, hypertension, lipid, weight-loss medications, and med management appointments    Valet parking services will be available as well.

## 2024-01-03 ENCOUNTER — Telehealth: Payer: Self-pay | Admitting: Cardiology

## 2024-01-03 MED ORDER — DILTIAZEM HCL ER COATED BEADS 120 MG PO CP24: 240.0000 mg | ORAL_CAPSULE | Freq: Every day | ORAL | Status: DC

## 2024-01-03 NOTE — Telephone Encounter (Signed)
 The fluctuation that he seen his pulse and heart rate is likely secondary to intermittent atrial fibrillation episodes. I started him on the lowest dose of Cardizem  to make sure that he does not bottom out but given his symptoms and blood pressure and pulse readings he could tolerate higher dose of Cardizem . Change Cardizem  to 240 mg p.o. daily 30 tablets no refills.  As the dose may still need to be uptitrated based on symptoms. Cardiac monitor results came back since last office visit his average heart rate on the monitor was 79 bpm, A-fib burden 9%.  [name] [name] [place], DO, [facility]

## 2024-01-03 NOTE — Telephone Encounter (Signed)
 Spoke with pt and he had stated that he is concerned about his HR dropping with going to the 240 mg dose. Asked pt what the lowest his HR had dropped to yesterday and he had stated 61. Asked pt if the HR stayed at that level and he said no it continued to increase and seemed to stay around mid 70s. Advised pt that we can try to do the 240 mg dose for 1 week and keep track of BP and HR during this time and see if the afib episodes still seem to occur frequently like he has been saying. Explained that if the pt has any issue with this dose during the week he can call our office and we can address the dosing. Pt verbalized understanding of the plan and agreed to move forward with this plan. Pt will be taking 2 tablets of his 120 mg Diltiazem  for the time being to total 240 mg once daily.

## 2024-01-03 NOTE — Telephone Encounter (Signed)
 Patient c/o Palpitations:  STAT if patient reporting lightheadedness, shortness of breath, or chest pain  How long have you had palpitations/irregular HR/ Afib? Are you having the symptoms now? Afib, not currently  Are you currently experiencing lightheadedness, SOB or CP? No  Do you have a history of afib (atrial fibrillation) or irregular heart rhythm? afib  Have you checked your BP or HR? (document readings if available): 124/62 hr 74, 134/75 hr 73, 143/171 hr 77  Are you experiencing any other symptoms? Since ov on 4/16 and adjustments made, pt has gone into afib 3 times and wants a cb to further discuss next step

## 2024-01-03 NOTE — Telephone Encounter (Signed)
 Spoke to patient and he reports that he has been going in and out of A. Fib the since Friday 12/29/23. Admits that he has had 3 episodes of A. Fib. He reports that his diltiazem  was changed to taking 120 mg daily and then has a 30 mg dose as a rescue. Pt admits that he has had to take his rescue dose. Pt reports that he feels that the diltiazem  120 mg has messed things up and is making his blood pressure and heart rate "jump all over the place". He states that he is having SBP as high as 140s to 150s and heart rates when in A.Fib as high as 150 bpm. Current heart rate is 80 bpm and pt reports that he is not currently in A.Fib. Pt wonders if there is a alternative or if he needs to come off of the diltiazem  120 mg?

## 2024-01-04 ENCOUNTER — Other Ambulatory Visit (HOSPITAL_COMMUNITY): Payer: Self-pay

## 2024-01-07 DIAGNOSIS — I480 Paroxysmal atrial fibrillation: Secondary | ICD-10-CM | POA: Diagnosis not present

## 2024-01-09 NOTE — Telephone Encounter (Signed)
 Have him work w/ PCP for anemia workup. May only need Iron supplements if he is truly iron deficient.   PCP can help look into non-cardiac causes of decreased energy - TSH, B12, VitD, etc.   Olinda Bertrand, DO, [facility]

## 2024-01-16 ENCOUNTER — Ambulatory Visit (HOSPITAL_COMMUNITY)
Admission: RE | Admit: 2024-01-16 | Discharge: 2024-01-16 | Disposition: A | Discharge status: Home / Self Care | Source: Ambulatory Visit | Attending: Internal Medicine | Admitting: Internal Medicine

## 2024-01-16 ENCOUNTER — Other Ambulatory Visit: Payer: Self-pay

## 2024-01-16 VITALS — BP 122/72 | HR 71 | Ht 69.0 in | Wt 174.8 lb

## 2024-01-16 DIAGNOSIS — I480 Paroxysmal atrial fibrillation: Secondary | ICD-10-CM

## 2024-01-16 DIAGNOSIS — I4891 Unspecified atrial fibrillation: Secondary | ICD-10-CM

## 2024-01-16 DIAGNOSIS — D6869 Other thrombophilia: Secondary | ICD-10-CM

## 2024-01-16 NOTE — Progress Notes (Signed)
 Pt walked-in to our clinic today stating he has been in afib for the past 5 days. Dr. Albert Huff recommends referral to afib clinic. Order has been placed.

## 2024-01-16 NOTE — Progress Notes (Signed)
 Primary Care Physician: Allene Ivan, MD Primary Cardiologist: Olinda Bertrand, DO Electrophysiologist: None     Referring Physician: Dr. Si Draft is a 83 y.o. 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 Diltiazem  recently increased to 240 mg daily due to 9% Afib burden on cardiac monitor. Patient walked into clinic noting he has been in Afib for the past 5 days. Patient is on Eliquis  5 mg BID for a CHADS2VASC score of 3.  On evaluation today, he is currently in atrial flutter. He notes taking the increased diltiazem  helped him for about a week or so but then has been paroxysmal since then. He states can overall tolerate the abnormal rhythm with additional diltiazem  60 mg PRN and taking breaks with walking.  Today, he denies symptoms of  chest pain, shortness of breath, orthopnea, PND, lower extremity edema, dizziness, presyncope, syncope, snoring, daytime somnolence, bleeding, or neurologic sequela. The patient is tolerating medications without difficulties and is otherwise without complaint today.     he has a BMI of Body mass index is 25.81 kg/m.Aaron Aas Filed Weights   01/16/24 1042  Weight: 79.3 kg    Current Outpatient Medications  Medication Sig Dispense Refill   acetaminophen  (TYLENOL ) 325 MG tablet Take 325 mg by mouth as needed.     apixaban  (ELIQUIS ) 5 MG TABS tablet Take 1 tablet (5 mg total) by mouth 2 (two) times daily. 60 tablet 3   diltiazem  (CARDIZEM  CD) 120 MG 24 hr capsule Take 2 capsules (240 mg total) by mouth daily.     diltiazem  (CARDIZEM ) 60 MG tablet Take 1 tablet (60 mg total) by mouth 4 (four) times daily as needed for  up to 30 doses. For heart rate above 100 beats per minute persistently while at rest 30 tablet 0   GEMTESA 75 MG TABS Take 1 tablet by mouth daily.     Prenatal Vit-Fe Fumarate-FA (WESTAB PLUS) 27-1 MG TABS Take 1 tablet by mouth daily.     rosuvastatin  (CRESTOR ) 20 MG tablet Take 1 tablet (20 mg total) by mouth at bedtime. 90 tablet 3   Current Facility-Administered Medications  Medication Dose Route Frequency Provider Last Rate Last Admin   0.9 %  sodium chloride  infusion  500 mL Intravenous Continuous Tobin Forts, MD        Atrial Fibrillation Management history:  Previous antiarrhythmic drugs: none Previous cardioversions: remote (20 years ago) Previous ablations: remote (20 years ago) Anticoagulation history: Eliquis    ROS- All systems are reviewed and negative except as per the HPI above.  Physical Exam: BP 122/72   Pulse 71   Ht 5\' 9"  (1.753 m)   Wt 79.3 kg   BMI 25.81 kg/m   GEN: Well nourished, well developed in no acute distress NECK: No JVD; No carotid bruits CARDIAC: Regular rate (in atrial flutter), no murmurs, rubs, gallops RESPIRATORY:  Clear to auscultation without rales, wheezing or rhonchi  ABDOMEN: Soft, non-tender, non-distended EXTREMITIES:  No edema; No deformity   EKG today demonstrates  Vent. rate 71 BPM PR interval * ms QRS duration 102 ms QT/QTcB 402/436 ms P-R-T axes 82 38 40 Atrial flutter with 4:1 A-V conduction Abnormal ECG When compared with ECG of 27-Dec-2023 16:39, Atrial  flutter has replaced Sinus rhythm  Echo 09/11/23 demonstrated   1. Left ventricular ejection fraction, by estimation, is 55 to 60%. The  left ventricle has normal function. The left ventricle has no regional  wall motion abnormalities. Left ventricular diastolic function could not  be evaluated.   2. Right ventricular systolic function is normal. The right ventricular  size is normal. There is normal pulmonary artery systolic pressure.   3. The mitral valve is  normal in structure. No evidence of mitral valve  regurgitation. No evidence of mitral stenosis.   4. The aortic valve is normal in structure. There is mild calcification  of the aortic valve. Aortic valve regurgitation is not visualized. No  aortic stenosis is present.   5. Aortic dilatation noted. There is dilatation of the aortic root,  measuring 41 mm. There is dilatation of the ascending aorta, measuring 39  mm.   6. The inferior vena cava is normal in size with greater than 50%  respiratory variability, suggesting right atrial pressure of 3 mmHg.    ASSESSMENT & PLAN CHA2DS2-VASc Score = 3  The patient's score is based upon: CHF History: 0 HTN History: 0 Diabetes History: 0 Stroke History: 0 Vascular Disease History: 1 Age Score: 2 Gender Score: 0       ASSESSMENT AND PLAN: Paroxysmal Atrial Fibrillation (ICD10:  I48.0) The patient's CHA2DS2-VASc score is 3, indicating a 3.2% annual risk of stroke.    He is currently in atrial flutter. We briefly discussed AAD therapy and ablation. He had an ablation 20 years ago and is very interested in repeat ablation. He does not want to take AAD therapy indefinitely and currently is not interested in additional medication at this time. He will contact office if he wishes to revisit this. Could consider Multaq, Tikosyn, or amiodarone  as bridge to ablation.   Will refer to Dr. Marven Slimmer per patient preference to discuss ablation.  Secondary Hypercoagulable State (ICD10:  D68.69) The patient is at significant risk for stroke/thromboembolism based upon his CHA2DS2-VASc Score of 3.  Continue Apixaban  (Eliquis ).  No missed doses.     Will refer to EP to discuss ablation.   Minnie Amber, PA-C  Afib Clinic [facility] [address] [place], [place] [address] [phone]

## 2024-01-17 ENCOUNTER — Telehealth: Payer: Self-pay | Admitting: Cardiology

## 2024-01-17 ENCOUNTER — Other Ambulatory Visit (HOSPITAL_COMMUNITY): Payer: Self-pay

## 2024-01-17 MED ORDER — DILTIAZEM HCL ER COATED BEADS 300 MG PO CP24: 300.0000 mg | ORAL_CAPSULE | Freq: Every day | ORAL | 0 refills | Status: DC

## 2024-01-17 NOTE — Telephone Encounter (Signed)
 Spoke with pt over the phone and explained Dr. Jesus Morones recommendations of increasing Diltiazem  to 300 mg once daily with only a 30 day supply. Pt agreed to this plan. New rx has been sent to pharmacy.

## 2024-01-17 NOTE — Telephone Encounter (Signed)
 Patient identification verified by 2 forms. [name] Duck, RN     Called and spoke to patient  Patient states:  - fatigue and fluttering are hard to live with daily.  - EP referral placed at Afib appt yesterday. He is waiting on appointment with Dr. Marven Slimmer to discuss ablation.  - Currently taking 240 mg daily of cardizem  but having to utilize 60 mg rescue dose 1-2 times each day as well.  -Would like recommendations on what to do in the meantime with his medication.   Patient denies:  - SOB, blurred vision, CP, one-sided muscle weakness.              Interventions/Plan: - Encounter forwarded to primary cardilogist/ nurse for review and f/u    Reviewed ED warning signs/precautions  Patient agrees with plan, no questions at this time

## 2024-01-17 NOTE — Telephone Encounter (Signed)
 Pt called in to inform Dr. Albert Huff and Lonni Robert, RN that he is appreciative of the referral to afib clinic and they are setting him up with Dr. Marven Slimmer. He asked that Lonni Robert, RN call him to discuss further.

## 2024-01-17 NOTE — Telephone Encounter (Signed)
 Increase Cardizem  CD to 300 mg po qday 30tabs no refill for now ST

## 2024-01-19 ENCOUNTER — Ambulatory Visit: Admitting: Cardiology

## 2024-01-19 MED ORDER — DILTIAZEM HCL ER COATED BEADS 300 MG PO CP24: 300.0000 mg | ORAL_CAPSULE | Freq: Every day | ORAL | 0 refills | Status: DC

## 2024-01-19 NOTE — Addendum Note (Signed)
 Addended by: Reyes Caul, [address] on: 01/19/2024 03:47 PM   Modules accepted: Orders

## 2024-01-19 NOTE — Telephone Encounter (Signed)
 Pt called in stating he hasn't received this medication yet, please advise.    diltiazem  (CARDIZEM  CD) 300 MG 24 hr capsule   [id number] - [place], [place] - [address] AT [facility] [address] Phone: [phone]  Fax: [phone]

## 2024-01-19 NOTE — Telephone Encounter (Signed)
 Called patient to let him know I sent medication in today. Patient would like our office to tell pharmacy he needs it in a hurry. Called pharmacy to let them know he needs medication as soon as possible.

## 2024-02-01 ENCOUNTER — Other Ambulatory Visit: Payer: Self-pay

## 2024-02-01 ENCOUNTER — Ambulatory Visit: Attending: Cardiology | Admitting: Cardiology

## 2024-02-01 ENCOUNTER — Encounter: Payer: Self-pay | Admitting: Cardiology

## 2024-02-01 VITALS — BP 112/68 | HR 84 | Ht 69.0 in | Wt 175.2 lb

## 2024-02-01 DIAGNOSIS — I483 Typical atrial flutter: Secondary | ICD-10-CM

## 2024-02-01 DIAGNOSIS — I480 Paroxysmal atrial fibrillation: Secondary | ICD-10-CM

## 2024-02-01 NOTE — Progress Notes (Signed)
 Electrophysiology Office Note:    Date:  02/01/2024   ID:  [name] [name], DOB 09-16-1940, MRN [id number]  [facility] Cardiologist:  Olinda Bertrand, DO  [facility] Electrophysiologist:  Boyce Byes, MD   Referring MD: Nathanel Bal, PA-C   Chief Complaint: Atrial fibrillation  History of Present Illness:    [name] [name] is an 83 year old man who I am seeing today for an evaluation of atrial fibrillation at the request of [name] [name].  The patient last saw Autry Legions in the atrial fibrillation clinic on Jan 16, 2024.  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  He has been having symptomatic atrial fibrillation and flutter.  He has been treated with diltiazem  in the past.  He presents today to discuss catheter ablation.  He had a prior catheter ablation with Dr Harwood Lingo at [facility] > 5 years ago.  He is with his wife today in clinic.  He is an active man.  He does couples golf with his wife.  He tells me that he can certainly feel that he is out of rhythm.  He reports GI and urinary symptoms that are a nuisance since being out of rhythm.  The A-fib recurred 3 months ago around the time of his left knee replacement.  He also an episode in 2016 at the time of a thymoma resection.     Their past medical, social and family history was reviewed.   ROS:   Please see the history of present illness.    All other systems reviewed and are negative.  EKGs/Labs/Other Studies Reviewed:    The following studies were reviewed today:  January 07, 2024 ZIO monitor personally reviewed Heart rates 53-1 78, average 75 9% burden of atrial fibrillation Less than 1% PVC burden  Jan 16, 2024 EKG shows atrial flutter with 4-1 AV conduction.  Typical appearing.  December 27, 2023 EKG shows sinus rhythm.  QTc is 440 ms.  Narrow  QRS.  September 11, 2023 echo EF 55% RV normal No significant valvular abnormalities  September 29, 2023 left heart catheterization Nonobstructive coronary artery disease EKG Interpretation Date/Time:  Thursday Feb 01 2024 09:49:50 EDT Ventricular Rate:  84 PR Interval:    QRS Duration:  82 QT Interval:  366 QTC Calculation: 432 R Axis:   26  Text Interpretation: Atrial fibrillation Nonspecific ST and T wave abnormality When compared with ECG of 16-Jan-2024 10:59, Confirmed by Harvie Liner [phone]) on 02/01/2024 9:58:08 AM    Physical Exam:    VS:  BP 112/68   Pulse 84   Ht 5\' 9"  (1.753 m)   Wt 175 lb 3.2 oz (79.5 kg)   SpO2 97%   BMI 25.87 kg/m     Wt Readings from Last 3 Encounters:  02/01/24 175 lb 3.2 oz (79.5 kg)  01/16/24 174 lb 12.8 oz (79.3 kg)  12/27/23 175 lb (79.4 kg)     GEN: no distress CARD: Irregularly irregular, No MRG RESP: No IWOB. CTAB.        ASSESSMENT AND PLAN:    1. Paroxysmal atrial fibrillation (HCC)   2. Typical atrial flutter (HCC)     #Atrial fibrillation and flutter Symptomatic. Discussed treatment options today for his atrial fibrillation flutter including antiarrhythmic drugs, conservative therapy and catheter ablation.  He would like to avoid exposure to antiarrhythmic drugs which is reasonable and he is interested in pursuing catheter ablation.  I discussed the catheter ablation procedure in detail including the risks, recovery and likelihood of success.  He is interested in proceeding.  Discussed treatment options today for AF including antiarrhythmic drug therapy and ablation. Discussed risks, recovery and likelihood of success with each treatment strategy. Risk, benefits, and alternatives to EP study and ablation for afib were discussed. These risks include but are not limited to stroke, bleeding, vascular damage, tamponade, perforation, damage to the esophagus, lungs, phrenic nerve and other structures, pulmonary vein  stenosis, worsening renal function, coronary vasospasm and death.  Discussed potential need for repeat ablation procedures and antiarrhythmic drugs after an initial ablation. The patient understands these risk and wishes to proceed.  We will therefore proceed with catheter ablation at the next available time.  Carto, ICE, anesthesia are requested for the procedure.  Will also obtain CT PV protocol prior to the procedure to exclude LAA thrombus and further evaluate atrial anatomy.  Ablation strategy would be PVI and Posterior wall and CTI.    Signed, Leanora Prophet. Marven Slimmer, MD, [facility] 02/01/2024 10:14 AM    Electrophysiology [facility]

## 2024-02-01 NOTE — Patient Instructions (Addendum)
 Medication Instructions:  Your physician recommends that you continue on your current medications as directed. Please refer to the Current Medication list given to you today.  *If you need a refill on your cardiac medications before your next appointment, please call your pharmacy*  Lab Work: BMET and CBC - you may come the [facility] or any [facility] location to have these drawn . Please go the week of June 23rd.  Testing/Procedures: Cardiac CT Your physician has requested that you have cardiac CT. Cardiac computed tomography (CT) is a painless test that uses an x-ray machine to take clear, detailed pictures of your heart. For further information please visit [email or web address]  We will call you to schedule your CT scan. It will be done about three weeks prior to your ablation.  Ablation Your physician has recommended that you have an ablation. Catheter ablation is a medical procedure used to treat some cardiac arrhythmias (irregular heartbeats). During catheter ablation, a long, thin, flexible tube is put into a blood vessel in your groin (upper thigh), or neck. This tube is called an ablation catheter. It is then guided to your heart through the blood vessel. Radio frequency waves destroy small areas of heart tissue where abnormal heartbeats may cause an arrhythmia to start.  You are scheduled for Atrial Fibrillation Ablation on Friday, July 18 with Dr. Harvie Liner.Please arrive at the Main Entrance A at [facility]: [address] [place], [place] [address] at 10:30 AM   Instructions will be sent to you through MyChart.   Follow-Up: At [facility], you and your health needs are our priority.  As part of our continuing mission to provide you with exceptional heart care, we have created designated Provider Care Teams.  These Care Teams include your primary Cardiologist (physician) and Advanced Practice Providers (APPs -  Physician Assistants and Nurse  Practitioners) who all work together to provide you with the care you need, when you need it.   Your next appointment:   We will contact you about your post-procedure follow up appointments.

## 2024-02-07 ENCOUNTER — Telehealth: Payer: Self-pay | Admitting: Cardiology

## 2024-02-07 NOTE — Telephone Encounter (Signed)
 Pt requesting cb from Lonni Robert to see if there are any sooner appts for his ablation.

## 2024-02-07 NOTE — Telephone Encounter (Signed)
 Spoke with the patient and advised that there are not currently any openings prior to his already scheduled ablation. Advised that he is on the waitlist and we will call him if something opens up sooner.

## 2024-02-10 ENCOUNTER — Other Ambulatory Visit: Payer: Self-pay | Admitting: Cardiology

## 2024-02-13 LAB — BASIC METABOLIC PANEL WITH GFR
BUN/Creatinine Ratio: 18 (ref 10–24)
BUN: 14 mg/dL (ref 8–27)
CO2: 21 mmol/L (ref 20–29)
Calcium: 9.5 mg/dL (ref 8.6–10.2)
Chloride: 101 mmol/L (ref 96–106)
Creatinine, Ser: 0.8 mg/dL (ref 0.76–1.27)
Glucose: 111 mg/dL — ABNORMAL HIGH (ref 70–99)
Potassium: 4.2 mmol/L (ref 3.5–5.2)
Sodium: 137 mmol/L (ref 134–144)
eGFR: 89 mL/min/{1.73_m2} (ref >59)

## 2024-02-13 LAB — CBC
Hematocrit: 36.1 % — ABNORMAL LOW (ref 37.5–51.0)
Hemoglobin: 11.5 g/dL — ABNORMAL LOW (ref 13.0–17.7)
MCH: 28.8 pg (ref 26.6–33.0)
MCHC: 31.9 g/dL (ref 31.5–35.7)
MCV: 91 fL (ref 79–97)
Platelets: 278 10*3/uL (ref 150–450)
RBC: 3.99 x10E6/uL — ABNORMAL LOW (ref 4.14–5.80)
RDW: 14 % (ref 11.6–15.4)
WBC: 4.8 10*3/uL (ref 3.4–10.8)

## 2024-03-05 ENCOUNTER — Ambulatory Visit: Admitting: Pulmonary Disease

## 2024-03-07 ENCOUNTER — Ambulatory Visit: Admitting: Internal Medicine

## 2024-03-07 ENCOUNTER — Encounter: Payer: Self-pay | Admitting: Internal Medicine

## 2024-03-07 VITALS — BP 110/68 | HR 83 | Ht 69.0 in | Wt 174.4 lb

## 2024-03-07 DIAGNOSIS — R0989 Other specified symptoms and signs involving the circulatory and respiratory systems: Secondary | ICD-10-CM

## 2024-03-07 DIAGNOSIS — R053 Chronic cough: Secondary | ICD-10-CM

## 2024-03-07 DIAGNOSIS — R0609 Other forms of dyspnea: Secondary | ICD-10-CM | POA: Diagnosis not present

## 2024-03-07 DIAGNOSIS — G588 Other specified mononeuropathies: Secondary | ICD-10-CM

## 2024-03-07 DIAGNOSIS — J3800 Paralysis of vocal cords and larynx, unspecified: Secondary | ICD-10-CM

## 2024-03-07 DIAGNOSIS — Z8679 Personal history of other diseases of the circulatory system: Secondary | ICD-10-CM | POA: Diagnosis not present

## 2024-03-07 DIAGNOSIS — Z9889 Other specified postprocedural states: Secondary | ICD-10-CM

## 2024-03-07 LAB — NITRIC OXIDE: Nitric Oxide: 30

## 2024-03-07 NOTE — Progress Notes (Signed)
 OV 03/07/2024  Subjective:  Patient ID: [name] [name], male , DOB: [birth year] , age 83 y.o. , MRN: [id number] , ADDRESS: [id number] [place] [id number] PCP Burney Darice CROME, MD Patient Care Team: Burney Darice CROME, MD as PCP - General (Family Medicine) Michele Richardson, DO as PCP - Cardiology (Cardiology) Cindie Ole DASEN, MD as PCP - Electrophysiology (Cardiology) Gust Thedora Blumenthal, MD as Referring Physician (Otolaryngology) Renda Glance, MD as Consulting Physician (Urology) Kerrin Elspeth BROCKS, MD as Consulting Physician (Cardiothoracic Surgery) Floy Lynwood PARAS, MD as Consulting Physician (Otolaryngology)  This Provider for this visit: Treatment Team:  Attending Provider: Geronimo Amel, MD    03/07/2024 -   Chief Complaint  Patient presents with   Pulmonary Consult    Referred by Dr. Darice Burney. Pt c/o DOE since Feb 2025 after. He has some diagram paralysis. He has a dry cough. He gets winded walking up approx 10 steps.      HPI [name] 49 y.o. -as a new consult.  He is a retired Associate Professor.  He is here because of chronic cough and also worsening shortness of breath.  All this started after his knee surgery in the left side February 2025.  He presents with his wife Angeline.  He states that around 18-20 years ago he had cardiac ablation for atrial fibrillation and then he started doing well.  And he barely had any dyspnea.  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  Still he maintained a good quality of life.  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  Then in February 2025 he underwent left knee surgery.  He said after that everything has gone bad in terms of his health.  He said atrial fibrillation that has  recurred.  He said UTI has had GI problems.  He has had fatigue.  He says he is fully functioning at 40% of his baseline in terms of energy and shortness of breath.  Now the knee has fully healed he started exercising himself and his energy levels and shortness of breath is somewhat better but he still dealing with a cough.  This cough started after the knee surgery it is present all the time.  Its present in the day does not wake him up at night.  Happens intermittently.  It is moderate in intensity.  It is dry there is no mucus.  He feels there is a tickle in the central part of the chest.  There are some mild possible wheezing but he does not think he has asthma.  Cough is worse by talking.  It is better by coughing more and drinking cold water .   He is due for a coronary cardiac CT tomorrow He denies any allergies Denies any acid reflux No ACE inhibitor intake  Personally visualized prior CT most recent is in December 2024: I see right lower lobe groundglass opacities possible early reticulation on the left base.  [He has right lower lobe crackles].  Prior CTs and PET scan showed right lower lobe atelectasis.  Radiologist described the most recent December 2024 CTA chest right lower lobe atelectasis and right diaphragmatic paralysis.  Patient disagrees with right diaphragmatic paralysis he is categorically is left diaphragm paralysis [prior chest x-ray suggest left diaphragm paralysis]  SIT STAND TEST - goal 15 times   03/07/2024    O2 used ra   PRobe - finter or forehead finger   Number sit and stand completed - goal 15 15   Time taken to complete 35 sec   Resting Pulse Ox/HR/Dyspnea  98% and 85/min and dyspnea of 2/10    Peak measures 96 % and 107/min and dyspnea of 7/10   Final Pulse Ox/HR 98% and 98/min and dyspnea of 2/10   Desaturated </= 88% no   Desaturated <= 3% points no   Got Tachycardic >/= 90/min yes   Miscellaneous comments Dyspneic without desatts      PFT      No data to display            IMPRESSION: 1. Ill-defined areas of ground-glass within the left lower lobe, not seen on prior exam, may be infectious or inflammatory. This is not in a configuration typical of hypoventilatory change. 2. Elevated right hemidiaphragm with adjacent compressive atelectasis or scarring. 3. The right middle lobe nodule on prior PET is not confidently seen on the current exam. 4. Unchanged size of 4 mm posterior mediastinal node that was tracer avid on prior PET.   Aortic Atherosclerosis (ICD10-I70.0).     Electronically Signed   By: Andrea Gasman M.D.   On: 08/27/2023 15:37     LAB RESULTS last 96 hours No results found.    Latest Reference Range & Units 08/13/12 12:16  Eosinophils Absolute 0.0 - 0.7 K/uL 0.2   IMPRESSIONS     1. Left ventricular ejection fraction, by estimation, is 55 to 60%. The  left ventricle has normal function. The left ventricle has no regional  wall motion abnormalities. Left ventricular diastolic function could not  be evaluated.   2. Right ventricular systolic function is normal. The right ventricular  size is normal. There is normal pulmonary artery systolic pressure.   3. The mitral valve is normal in structure. No evidence of mitral valve  regurgitation. No evidence of mitral stenosis.   4. The aortic valve is normal in structure. There is mild calcification  of the aortic valve. Aortic valve regurgitation is not visualized. No  aortic stenosis is present.   5. Aortic dilatation noted. There is dilatation of the aortic root,  measuring 41 mm. There is dilatation of the ascending aorta, measuring 39  mm.   6. The inferior vena cava is normal in size with greater than 50%  respiratory variability, suggesting right atrial pressure of 3 mmHg.      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   reports that he has never smoked. He has never used smokeless tobacco.  Past Surgical History:  Procedure Laterality Date   ATRIAL ABLATION SURGERY     for a fib x 5 years ago   BACK SURGERY     cataract surgery      COLONOSCOPY     CYSTOSCOPY N/A 11/26/2014   Procedure: CYSTOSCOPY FLEXIBLE;  Surgeon: Glendia Elizabeth, MD;  Location: [facility] OR;  Service: Urology;  Laterality: N/A;   CYSTOSCOPY WITH URETHRAL DILATATION N/A 11/26/2014  Procedure: CYSTOSCOPY WITH URETHRAL DILATATION WITH INSERTION OF FOLEY;  Surgeon: Glendia Elizabeth, MD;  Location[facility] OR;  Service: Urology;  Laterality: N/A;   FLEXIBLE SIGMOIDOSCOPY  02/10/2012   Procedure: FLEXIBLE SIGMOIDOSCOPY;  Surgeon: Norleen LOISE Kiang, MD;  Location: [facility] ENDOSCOPY;  Service: Endoscopy;  Laterality: N/A;  need APC    KNEE ARTHROSCOPY Left    LEAD REMOVAL N/A 11/26/2014   Procedure: CRYO INTERCOSTAL NERVE BLOCK;  Surgeon: Elspeth JAYSON Millers, MD;  Location: [facility] OR;  Service: Thoracic;  Laterality: N/A;   LEFT HEART CATH AND CORONARY ANGIOGRAPHY N/A 09/29/2023   Procedure: LEFT HEART CATH AND CORONARY ANGIOGRAPHY;  Surgeon: Swaziland, Peter M, MD;  Location: [facility] INVASIVE CV LAB;  Service: Cardiovascular;  Laterality: N/A;   POLYPECTOMY     PROSTATE SURGERY     RESECTION OF MEDIASTINAL MASS N/A 11/26/2014   Procedure: RESECTION OF ANTERIOR MEDIASTINAL MASS;  Surgeon: Elspeth JAYSON Millers, MD;  Location: [facility] OR;  Service: Thoracic;  Laterality: N/A;   ROBOT ASSISTED  LAPAROSCOPIC RADICAL PROSTATECTOMY  09/13/2007   radiation Tx 2011   TONSILLECTOMY     TOTAL KNEE ARTHROPLASTY Left 10/30/2023   Procedure: TOTAL KNEE ARTHROPLASTY;  Surgeon: Melodi Lerner, MD;  Location: [facility] ORS;  Service: Orthopedics;  Laterality: Left;   VASECTOMY     VIDEO ASSISTED THORACOSCOPY Left 11/26/2014   Procedure: VIDEO ASSISTED THORACOSCOPY;  Surgeon: Elspeth JAYSON Millers, MD;  Location: [facility] OR;  Service: Thoracic;  Laterality: Left;    No Known Allergies  Immunization History  Administered Date(s) Administered   Influenza Split 07/31/2014   Influenza, Seasonal, Injecte, Preservative Fre 08/13/2012   Influenza,inj,Quad PF,6+ Mos 07/02/2015   Pneumococcal Conjugate-13 07/08/2014   Pneumococcal Polysaccharide-23 08/15/2013   Pneumococcal-Unspecified 09/13/2003   Tdap 08/15/2013   Zoster, Live 09/12/2008    Family History  Problem Relation Age of Onset   Prostate cancer Father    Heart disease Mother    Heart attack Mother    Healthy Sister    Healthy Sister    Colon cancer Neg Hx    Esophageal cancer Neg Hx    Rectal cancer Neg Hx    Stomach cancer Neg Hx    Pancreatic cancer Neg Hx      Current Outpatient Medications:    acetaminophen  (TYLENOL ) 325 MG tablet, Take 325 mg by mouth as needed., Disp: , Rfl:    apixaban  (ELIQUIS ) 5 MG TABS tablet, Take 1 tablet (5 mg total) by mouth 2 (two) times daily., Disp: 60 tablet, Rfl: 3   diltiazem  (CARDIZEM  CD) 300 MG 24 hr capsule, TAKE 1 CAPSULE(300 MG) BY MOUTH DAILY, Disp: 90 capsule, Rfl: 3   GEMTESA 75 MG TABS, Take 1 tablet by mouth daily., Disp: , Rfl:    Prenatal Vit-Fe Fumarate-FA (WESTAB PLUS) 27-1 MG TABS, Take 1 tablet by mouth daily., Disp: , Rfl:    rosuvastatin  (CRESTOR ) 20 MG tablet, Take 1 tablet (20 mg total) by mouth at bedtime., Disp: 90 tablet, Rfl: 3   diltiazem  (CARDIZEM ) 60 MG tablet, Take 1 tablet (60 mg total) by mouth 4 (four) times daily as needed for up to 30 doses. For heart rate above 100  beats per minute persistently while at rest (Patient not taking: Reported on 03/07/2024), Disp: 30 tablet, Rfl: 0  Current Facility-Administered Medications:    0.9 %  sodium chloride  infusion, 500 mL, Intravenous, Continuous, Kiang Norleen LOISE, MD      Objective:   Vitals:   03/07/24 9145 03/07/24 0855  BP:  110/68   Pulse: 83   SpO2: 98%   Weight:  174 lb 6.4 oz (79.1 kg)  Height:  5' 9 (1.753 m)    Estimated body mass index is 25.75 kg/m as calculated from the following:   Height as of this encounter: 5' 9 (1.753 m).   Weight as of this encounter: 174 lb 6.4 oz (79.1 kg).  @WEIGHTCHANGE @  [employer]   03/07/24 0855  Weight: 174 lb 6.4 oz (79.1 kg)     Physical Exam   General: No distress. Looks well O2 at rest: no Cane present: no Sitting in wheel chair: no Frail: no Obese: no Neuro: Alert and Oriented x 3. GCS 15. Speech normal Psych: Pleasant Resp:  Barrel Chest - no.  Wheeze - no, Crackles - RIGHT BASE, No overt respiratory distress CVS: Normal heart sounds. Murmurs - no Ext: Stigmata of Connective Tissue Disease - no HEENT: Normal upper airway. PEERL +. No post nasal drip        Assessment:       ICD-10-CM   1. DOE (dyspnea on exertion)  R06.09 Nitric oxide    2. Chronic cough  R05.3 Nitric oxide    3. Respiratory crackles at right lung base  R09.89     4. History of atrial fibrillation  Z86.79     5. 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 History of knee surgery  Z98.890          Plan:     Patient Instructions     ICD-10-CM   1. DOE (dyspnea on exertion)  R06.09     2. Chronic cough  R05.3     3. Respiratory crackles at right lung base  R09.89     4. History of atrial fibrillation  Z86.79     5. 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 History of knee surgery  Z98.890       Common unifying reasons for new onset of  cough and worsening shortness of breath since the knee surgery in February 2025 include inflammation in the lung, asthma/allergy, potential new onset mass [highly unlikely], any further trauma to the vocal cord during intubation for knee surgery in February 2025.  Outside of this worsening shortness of breath could be due to physical deconditioning after knee surgery and ongoing atrial fibrillation  FENO test is 30 pbb and slight borderline - suggesting that asthma cannot be ruled  Oxygen levels have held good doing 15 sit stand x 35 seconds is 2X of our average paients in clinic. There are only 45 31+ year old patients I have seen do it this fast in this clinic and one of them is you  Plan - Await results of CT scan of the chest being done for coronary CT on March 08, 2024  - Please text me as soon as images done so I can take a look at it and advise if you need a high-resolution CT chest  - Depending on course you might need blood allergy test and pulmonary function test or sniff test  -Okay to undergo ablation March 29, 2024 [oxygen levels held fine with exercise and fitness level is adequate]  Follow-up - 8-12 weeks video visit Dr. Geronimo or nurse practitioner face-to-face   FOLLOWUP Return in about 10 weeks (around 05/16/2024) for  15 min visit, with Dr Geronimo, Face to Face OR Video Visit.    SIGNATURE    Dr. Dorethia Geronimo, M.D., F.C.C.P,  Pulmonary and Critical Care Medicine Staff Physician, [facility] Center Director - Interstitial Lung Disease  Program  Pulmonary Fibrosis [facility] at [facility] [place], [place], [address]  Pager: [phone], If no answer or between  15:00h - 7:00h: call [phone] Telephone: [phone]  9:40 AM 03/07/2024

## 2024-03-07 NOTE — Patient Instructions (Addendum)
 ICD-10-CM   1. DOE (dyspnea on exertion)  R06.09     2. Chronic cough  R05.3     3. Respiratory crackles at right lung base  R09.89     4. History of atrial fibrillation  Z86.79     5. 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 History of knee surgery  Z98.890       Common unifying reasons for new onset of cough and worsening shortness of breath since the knee surgery in February 2025 include inflammation in the lung, asthma/allergy, potential new onset mass [highly unlikely], any further trauma to the vocal cord during intubation for knee surgery in February 2025.  Outside of this worsening shortness of breath could be due to physical deconditioning after knee surgery and ongoing atrial fibrillation  FENO test is 30 pbb and slight borderline - suggesting that asthma cannot be ruled  Oxygen levels have held good doing 15 sit stand x 35 seconds is 2X of our average paients in clinic. There are only 63 83+ year old patients I have seen do it this fast in this clinic and one of them is you  Plan - Await results of CT scan of the chest being done for coronary CT on March 08, 2024  - Please text me as soon as images done so I can take a look at it and advise if you need a high-resolution CT chest  - Depending on course you might need blood allergy test and pulmonary function test or sniff test  -Okay to undergo ablation March 29, 2024 [oxygen levels held fine with exercise and fitness level is adequate]  Follow-up - 8-12 weeks video visit Dr. Geronimo or nurse practitioner face-to-face

## 2024-03-08 ENCOUNTER — Ambulatory Visit (HOSPITAL_COMMUNITY)
Admission: RE | Admit: 2024-03-08 | Discharge: 2024-03-08 | Disposition: A | Discharge status: Home / Self Care | Payer: Self-pay | Source: Ambulatory Visit | Attending: Cardiology | Admitting: Cardiology

## 2024-03-08 DIAGNOSIS — I483 Typical atrial flutter: Secondary | ICD-10-CM | POA: Insufficient documentation

## 2024-03-08 DIAGNOSIS — I700 Atherosclerosis of aorta: Secondary | ICD-10-CM | POA: Insufficient documentation

## 2024-03-08 DIAGNOSIS — I480 Paroxysmal atrial fibrillation: Secondary | ICD-10-CM | POA: Insufficient documentation

## 2024-03-08 MED ORDER — IOHEXOL 350 MG/ML SOLN
100.0000 mL | Freq: Once | INTRAVENOUS | Status: AC | PRN
  Administered 2024-03-08: 100 mL via INTRAVENOUS

## 2024-03-08 MED ORDER — NITROGLYCERIN 0.4 MG SL SUBL
SUBLINGUAL_TABLET | SUBLINGUAL | Status: AC
  Filled 2024-03-08: qty 2

## 2024-03-09 ENCOUNTER — Telehealth: Payer: Self-pay | Admitting: Internal Medicine

## 2024-03-09 DIAGNOSIS — J986 Disorders of diaphragm: Secondary | ICD-10-CM

## 2024-03-09 DIAGNOSIS — J9612 Chronic respiratory failure with hypercapnia: Secondary | ICD-10-CM

## 2024-03-09 DIAGNOSIS — R0609 Other forms of dyspnea: Secondary | ICD-10-CM

## 2024-03-09 NOTE — Telephone Encounter (Signed)
    Steen Christopher Gaskins = Over the weekend I reviewed his cardiac CT.  Again there is some suggestion of interstitial infiltrates in the lower base.  He did have crackles in the right lower base.  Again all the CT scans are cardiac and I do not have a clear understanding of those findings and also he has the symptoms.  Plan - Please order high-resolution CT chest supine and prone inspiratory and expiratory phase.  -Ideally needs to get it done before his ablation but he can also do 2 weeks after that.  - He needs to text me after the high-res CT is done.

## 2024-03-11 NOTE — Telephone Encounter (Signed)
 I called and spoke with the [name] [name] and notified of recommendations from MR  He verbalized understanding  I have ordered the HRCT

## 2024-03-18 ENCOUNTER — Ambulatory Visit
Admission: RE | Admit: 2024-03-18 | Discharge: 2024-03-18 | Disposition: A | Discharge status: Home / Self Care | Source: Ambulatory Visit | Attending: Internal Medicine | Admitting: Internal Medicine

## 2024-03-18 ENCOUNTER — Other Ambulatory Visit: Payer: Self-pay | Admitting: Cardiology

## 2024-03-18 DIAGNOSIS — J9612 Chronic respiratory failure with hypercapnia: Secondary | ICD-10-CM

## 2024-03-18 DIAGNOSIS — I480 Paroxysmal atrial fibrillation: Secondary | ICD-10-CM

## 2024-03-18 DIAGNOSIS — R0609 Other forms of dyspnea: Secondary | ICD-10-CM

## 2024-03-18 NOTE — Telephone Encounter (Signed)
 Eliquis  5mg  refill request received. Patient is 83 years old, weight-79.1kg, Crea-0.80 on 02/12/24, Diagnosis-Afib, and last seen by Dr. Cindie on 02/01/24. Dose is appropriate based on dosing criteria. Will send in refill to requested pharmacy.

## 2024-03-22 ENCOUNTER — Telehealth (HOSPITAL_COMMUNITY): Payer: Self-pay

## 2024-03-22 DIAGNOSIS — I480 Paroxysmal atrial fibrillation: Secondary | ICD-10-CM

## 2024-03-22 NOTE — Telephone Encounter (Signed)
 Spoke with patient to discuss upcoming procedure.   CT: completed.  Labs: Patient had labs drawn on 6/2. Advised labs have to be within 30 days of procedure. New orders placed/released. Patient to have completed by 7/14.   Any recent signs of acute illness or been started on antibiotics? No Any new medications started? No Any medications to hold? No  Any missed doses of blood thinner? No Advised patient to continue taking ANTICOAGULANT: Eliquis  (Apixaban ) twice daily without missing any doses.  Medication instructions:  On the morning of your procedure DO NOT take any medication., including Eliquis  or the procedure may be rescheduled. Nothing to eat or drink after midnight prior to your procedure.  Confirmed patient is scheduled for Atrial Fibrillation Ablation on Friday, July 18 with Dr. Ole Holts. Instructed patient to arrive at the Main Entrance A at [facility]: [address] [place], [place] [address] and check in at Admitting at 10:30 AM.  Advised of plan to go home the same day and will only stay overnight if medically necessary. You MUST have a responsible adult to drive you home and MUST be with you the first 24 hours after you arrive home or your procedure could be cancelled.  Patient verbalized understanding to all instructions provided and agreed to proceed with procedure.

## 2024-03-23 LAB — BASIC METABOLIC PANEL WITH GFR
BUN/Creatinine Ratio: 15 (ref 10–24)
BUN: 14 mg/dL (ref 8–27)
CO2: 21 mmol/L (ref 20–29)
Calcium: 9.6 mg/dL (ref 8.6–10.2)
Chloride: 101 mmol/L (ref 96–106)
Creatinine, Ser: 0.96 mg/dL (ref 0.76–1.27)
Glucose: 100 mg/dL — ABNORMAL HIGH (ref 70–99)
Potassium: 4.7 mmol/L (ref 3.5–5.2)
Sodium: 139 mmol/L (ref 134–144)
eGFR: 79 mL/min/1.73 (ref >59)

## 2024-03-23 LAB — CBC
Hematocrit: 39.1 % (ref 37.5–51.0)
Hemoglobin: 12.7 g/dL — ABNORMAL LOW (ref 13.0–17.7)
MCH: 29.2 pg (ref 26.6–33.0)
MCHC: 32.5 g/dL (ref 31.5–35.7)
MCV: 90 fL (ref 79–97)
Platelets: 256 x10E3/uL (ref 150–450)
RBC: 4.35 x10E6/uL (ref 4.14–5.80)
RDW: 14.6 % (ref 11.6–15.4)
WBC: 8.2 x10E3/uL (ref 3.4–10.8)

## 2024-03-28 ENCOUNTER — Encounter: Payer: Self-pay | Admitting: Cardiology

## 2024-03-28 ENCOUNTER — Ambulatory Visit: Admitting: Cardiology

## 2024-03-28 ENCOUNTER — Ambulatory Visit: Attending: Cardiology | Admitting: Cardiology

## 2024-03-28 VITALS — BP 110/68 | HR 94 | Resp 16 | Ht 69.0 in | Wt 174.4 lb

## 2024-03-28 DIAGNOSIS — I2510 Atherosclerotic heart disease of native coronary artery without angina pectoris: Secondary | ICD-10-CM | POA: Diagnosis not present

## 2024-03-28 DIAGNOSIS — I4819 Other persistent atrial fibrillation: Secondary | ICD-10-CM | POA: Diagnosis not present

## 2024-03-28 DIAGNOSIS — R931 Abnormal findings on diagnostic imaging of heart and coronary circulation: Secondary | ICD-10-CM

## 2024-03-28 DIAGNOSIS — Z9889 Other specified postprocedural states: Secondary | ICD-10-CM | POA: Diagnosis not present

## 2024-03-28 DIAGNOSIS — E7800 Pure hypercholesterolemia, unspecified: Secondary | ICD-10-CM

## 2024-03-28 DIAGNOSIS — D6869 Other thrombophilia: Secondary | ICD-10-CM | POA: Diagnosis not present

## 2024-03-28 DIAGNOSIS — I77810 Thoracic aortic ectasia: Secondary | ICD-10-CM

## 2024-03-28 DIAGNOSIS — Z8679 Personal history of other diseases of the circulatory system: Secondary | ICD-10-CM

## 2024-03-28 DIAGNOSIS — I700 Atherosclerosis of aorta: Secondary | ICD-10-CM

## 2024-03-28 MED ORDER — APIXABAN 5 MG PO TABS: 5.0000 mg | ORAL_TABLET | Freq: Two times a day (BID) | ORAL | 6 refills | Status: DC

## 2024-03-28 NOTE — Anesthesia Preprocedure Evaluation (Signed)
 Anesthesia Evaluation  Patient identified by MRN, date of birth, ID band Patient awake    Reviewed: Allergy & Precautions, NPO status , Patient's Chart, lab work & pertinent test results  History of Anesthesia Complications Negative for: history of anesthetic complications  Airway Mallampati: III  TM Distance: >3 FB Neck ROM: Full    Dental  (+) Teeth Intact, Dental Advisory Given   Pulmonary shortness of breath   Pulmonary exam normal breath sounds clear to auscultation       Cardiovascular + CAD  Normal cardiovascular exam+ dysrhythmias (on Eliquis ) Atrial Fibrillation  Rhythm:Regular Rate:Normal  TTE 2024  1. Left ventricular ejection fraction, by estimation, is 55 to 60%. The  left ventricle has normal function. The left ventricle has no regional  wall motion abnormalities. Left ventricular diastolic function could not  be evaluated.   2. Right ventricular systolic function is normal. The right ventricular  size is normal. There is normal pulmonary artery systolic pressure.   3. The mitral valve is normal in structure. No evidence of mitral valve  regurgitation. No evidence of mitral stenosis.   4. The aortic valve is normal in structure. There is mild calcification  of the aortic valve. Aortic valve regurgitation is not visualized. No  aortic stenosis is present.   5. Aortic dilatation noted. There is dilatation of the aortic root,  measuring 41 mm. There is dilatation of the ascending aorta, measuring 39  mm.   6. The inferior vena cava is normal in size with greater than 50%  respiratory variability, suggesting right atrial pressure of 3 mmHg.     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 SOB walking up stairs and slight hoarseness, otherwise no symptoms. No oxygen.   Reproductive/Obstetrics                              Anesthesia Physical Anesthesia Plan  ASA: 3  Anesthesia Plan: General   Post-op Pain Management: Minimal or no pain anticipated   Induction: Intravenous  PONV Risk Score and Plan: 2 and Treatment may vary due to age or medical condition, Ondansetron  and Dexamethasone   Airway Management Planned: Oral ETT and Video Laryngoscope Planned  Additional Equipment: None  Intra-op Plan:   Post-operative Plan: Extubation in OR  Informed Consent: I have reviewed the patients History and Physical, chart, labs and discussed the procedure including the risks, benefits and alternatives for the proposed anesthesia with the patient or authorized representative who has indicated his/her understanding and acceptance.     Dental advisory given  Plan Discussed with: CRNA  Anesthesia Plan Comments:          Anesthesia Quick Evaluation

## 2024-03-28 NOTE — Patient Instructions (Signed)
 Medication Instructions:  Your physician recommends that you continue on your current medications as directed. Please refer to the Current Medication list given to you today.  *If you need a refill on your cardiac medications before your next appointment, please call your pharmacy*  Lab Work: If you have labs (blood work) drawn today and your tests are completely normal, you will receive your results only by: MyChart Message (if you have MyChart) OR A paper copy in the mail If you have any lab test that is abnormal or we need to change your treatment, we will call you to review the results.  Testing/Procedures: Your physician has requested that you have an echocardiogram in December. Echocardiography is a painless test that uses sound waves to create images of your heart. It provides your doctor with information about the size and shape of your heart and how well your heart's chambers and valves are working. This procedure takes approximately one hour. There are no restrictions for this procedure. Please do NOT wear cologne, perfume, aftershave, or lotions (deodorant is allowed). Please arrive 15 minutes prior to your appointment time.  Please note: We ask at that you not bring children with you during ultrasound (echo/ vascular) testing. Due to room size and safety concerns, children are not allowed in the ultrasound rooms during exams. Our front office staff cannot provide observation of children in our lobby area while testing is being conducted. An adult accompanying a patient to their appointment will only be allowed in the ultrasound room at the discretion of the ultrasound technician under special circumstances. We apologize for any inconvenience.   Follow-Up: At [facility], you and your health needs are our priority.  As part of our continuing mission to provide you with exceptional heart care, our providers are all part of one team.  This team includes your primary  Cardiologist (physician) and Advanced Practice Providers or APPs (Physician Assistants and Nurse Practitioners) who all work together to provide you with the care you need, when you need it.  Your next appointment:   6 month(s)  Provider:   Madonna Large, DO    We recommend signing up for the patient portal called MyChart.  Sign up information is provided on this After Visit Summary.  MyChart is used to connect with patients for Virtual Visits (Telemedicine).  Patients are able to view lab/test results, encounter notes, upcoming appointments, etc.  Non-urgent messages can be sent to your provider as well.   To learn more about what you can do with MyChart, go to [email or web address].

## 2024-03-28 NOTE — Progress Notes (Signed)
 Cardiology Office Note:    NAME:  [name] [name]    MRN: [id number] DOB:  Jan 05, 1941   PCP:  Burney Darice CROME, MD  Former Cardiology Providers: Dr. Gracelyn, Dr. Pietro Primary Cardiologist:  Madonna Large, DO, [facility] (established care 08/01/2023) Electrophysiologist:  OLE ONEIDA HOLTS, MD   Chief Complaint  Patient presents with   Atrial Fibrillation   Follow-up    History of Present Illness:    [name] [name] is a 83 y.o. 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   Patient is being followed by the practice given his history of coronary artery disease/CAC and atrial fibrillation. Prior to his left knee replacement he was referred to cardiology for preoperative risk stratification.  Due to his shortness of breath and multiple cardiovascular risk factors coronary CTA was performed which noted severe CAC.  He eventually underwent left heart catheterization and was noted to have nonobstructive CAD and he successfully underwent knee replacement surgery.  Postsurgery he had urinary tract infections were treated with antibiotics but unfortunately had recurrence of atrial fibrillation.  He has been on medical therapy for the last several months and remains symptomatic despite better ventricular rate control.  He has been evaluated by atrial fibrillation clinic as well as Dr. HOLTS from cardiac electrophysiology for management of his A-fib.  Shared decision was to proceed forward with atrial fibrillation ablation.  He is scheduled for  ablation tomorrow 03/29/2024.  No questions or concerns regarding his upcoming appointment.  He denies anginal chest pain or heart failure symptoms.  He is accompanied by his wife at today's visit.  They had questions regarding anticoagulation long-term.  His current CHA2DS2-VASc SCORE is 3 and given the upcoming atrial fibrillation ablation he will need to be on anticoagulation for at least 4 weeks long-term need for oral anticoagulation can be discussed with the EP postprocedure.   Current Medications: Current Meds  Medication Sig   acetaminophen  (TYLENOL ) 325 MG tablet Take 650 mg by mouth every 6 (six) hours as needed for mild pain (pain score 1-3), moderate pain (pain score 4-6) or headache.   diltiazem  (CARDIZEM  CD) 300 MG 24 hr capsule TAKE 1 CAPSULE(300 MG) BY MOUTH DAILY   GEMTESA 75 MG TABS Take 75 mg by mouth daily.   Prenatal Vit-Fe Fumarate-FA (WESTAB PLUS) 27-1 MG TABS Take 1 tablet by mouth daily.   rosuvastatin  (CRESTOR ) 20 MG tablet Take 1 tablet (20 mg total) by mouth at bedtime.   trospium (SANCTURA) 20 MG tablet Take 20 mg by mouth 2 (two) times daily.   [DISCONTINUED] apixaban  (ELIQUIS ) 5 MG TABS tablet TAKE 1 TABLET(5 MG) BY MOUTH TWICE DAILY   Current Facility-Administered Medications for the 03/28/24 encounter (Office Visit) with Raymone Pembroke, DO  Medication   0.9 %  sodium chloride  infusion     Allergies:    Patient has no known allergies.   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    Colon polyps  hyperplastic and tubular adenomatous   Coronary atherosclerosis due to calcified coronary lesion    Diverticulosis    Dyspnea    Dysrhythmia    hx at fib- ablation 8 yrs ago   Erectile dysfunction following radical prostatectomy    Hypercapnic  respiratory failure, chronic (HCC) 05/01/2015   Mediastinal mass    per CT CHEST 11/07/14 @ [facility].SABRAANTERIOR...4.2 X 3.3 cm soft tissue   Neuromuscular disorder (HCC)    Prostate cancer [facility])    prostate cancer   Radiation    for prostate cancer   Radiation proctitis    rectum   Recurrent laryngeal nerve palsy    Respiratory failure (HCC)    s/p phrenic nerve palsy    Thrombocytopenia (HCC)    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     Past Surgical History: Past Surgical History:  Procedure Laterality Date   ATRIAL ABLATION SURGERY     for a fib x 5 years ago   BACK SURGERY     cataract surgery      COLONOSCOPY     CYSTOSCOPY N/A 11/26/2014   Procedure: CYSTOSCOPY FLEXIBLE;  Surgeon: Glendia Elizabeth, MD;  Location: [facility] OR;  Service: Urology;  Laterality: N/A;   CYSTOSCOPY WITH URETHRAL DILATATION N/A 11/26/2014   Procedure: CYSTOSCOPY WITH URETHRAL DILATATION WITH INSERTION OF FOLEY;  Surgeon: Glendia Elizabeth, MD;  Location: [facility] OR;  Service: Urology;  Laterality: N/A;   FLEXIBLE SIGMOIDOSCOPY  02/10/2012   Procedure: FLEXIBLE SIGMOIDOSCOPY;  Surgeon: Norleen LOISE Kiang, MD;  Location: [facility] ENDOSCOPY;  Service: Endoscopy;  Laterality: N/A;  need APC    KNEE ARTHROSCOPY Left    LEAD REMOVAL N/A 11/26/2014   Procedure: CRYO INTERCOSTAL NERVE BLOCK;  Surgeon: Elspeth JAYSON Millers, MD;  Location: [facility] OR;  Service: Thoracic;  Laterality: N/A;   LEFT HEART CATH AND CORONARY ANGIOGRAPHY N/A 09/29/2023   Procedure: LEFT HEART CATH AND CORONARY ANGIOGRAPHY;  Surgeon: Swazil[place], Peter M, MD;  Location: [facility] INVASIVE CV LAB;  Service: Cardiovascular;  Laterality: N/A;   POLYPECTOMY     PROSTATE SURGERY     RESECTION OF MEDIASTINAL MASS N/A 11/26/2014   Procedure: RESECTION OF ANTERIOR MEDIASTINAL MASS;  Surgeon: Elspeth JAYSON Millers, MD;  Location: [facility] OR;  Service: Thoracic;  Laterality: N/A;   ROBOT ASSISTED LAPAROSCOPIC RADICAL PROSTATECTOMY  09/13/2007   radiation  Tx 2011   TONSILLECTOMY     TOTAL KNEE ARTHROPLASTY Left 10/30/2023   Procedure: TOTAL KNEE ARTHROPLASTY;  Surgeon: Melodi Lerner, MD;  Location: [facility] ORS;  Service: Orthopedics;  Laterality: Left;   VASECTOMY     VIDEO ASSISTED THORACOSCOPY Left 11/26/2014   Procedure: VIDEO ASSISTED THORACOSCOPY;  Surgeon: Elspeth JAYSON Millers, MD;  Location: [facility] OR;  Service: Thoracic;  Laterality: Left;    Social History: Social History   Tobacco Use   Smoking status: Never   Smokeless tobacco: Never  Vaping Use   Vaping status: Never Used  Substance Use Topics   Alcohol use: No    Alcohol/week: 0.0 standard drinks of alcohol   Drug use: No    Family History: Family History  Problem Relation Age of Onset   Prostate cancer Father    Heart disease Mother    Heart attack Mother    Healthy Sister    Healthy Sister    Colon cancer Neg Hx    Esophageal cancer Neg Hx    Rectal cancer Neg Hx    Stomach cancer Neg Hx    Pancreatic  cancer Neg Hx     ROS:   Review of Systems  Constitutional: Positive for malaise/fatigue.  Cardiovascular:  Negative for chest pain, claudication, irregular heartbeat, leg swelling, near-syncope, orthopnea, palpitations, paroxysmal nocturnal dyspnea and syncope.  Respiratory:  Negative for shortness of breath.   Hematologic/Lymphatic: Negative for bleeding problem.    EKGs/Labs/Other Studies Reviewed:   Echocardiogram December 2024: 09/11/2023  1. Left ventricular ejection fraction, by estimation, is 55 to 60%. The left ventricle has normal function. The left ventricle has no regional wall motion abnormalities. Left ventricular diastolic function could not be evaluated.  2. Right ventricular systolic function is normal. The right ventricular size is normal. There is normal pulmonary artery systolic pressure.  3. The mitral valve is normal in structure. No evidence of mitral valve regurgitation. No evidence of mitral stenosis.  4. The aortic valve is normal in  structure. There is mild calcification of the aortic valve. Aortic valve regurgitation is not visualized. No aortic stenosis is present.  5. Aortic dilatation noted. There is dilatation of the aortic root, measuring 41 mm. There is dilatation of the ascending aorta, measuring 39 mm.  6. The inferior vena cava is normal in size with greater than 50% respiratory variability, suggesting right atrial pressure of 3 mmHg.  Coronary CTA December 2024: 1. Coronary artery calcium  score 3221 Agatston units. This suggests high risk for future cardiac events.   2. Moderate stenosis distal LAD, borderline hemodynamic significance by FFR.   3. Moderate stenosis distal RCA into bifurcation of PLV and PDA. Could not be assessed by FFR. Looks to be of borderline hemodynamic significance.  4.  Noncardiac findings: 1. Ill-defined areas of ground-glass within the left lower lobe, not seen on prior exam, may be infectious or inflammatory. This is not in a configuration typical of hypoventilatory change. 2. Elevated right hemidiaphragm with adjacent compressive atelectasis or scarring. 3. The right middle lobe nodule on prior PET is not confidently seen on the current exam. 4. Unchanged size of 4 mm posterior mediastinal node that was tracer avid on prior PET.   5. Aortic Atherosclerosis (ICD10-I70.0).  CT FFR December 2024: 1. Moderate stenosis distal LAD, borderline hemodynamic significance by FFR.   3. Moderate stenosis distal RCA into bifurcation of PLV and PDA. Could not be assessed by FFR. Looks to be of borderline hemodynamic significance.  Pulmonary vein study 03/11/2024 1. There is normal pulmonary vein drainage into the left atrium with ostial measurements above.   2. There is no thrombus in the left atrial appendage.   3. The esophagus runs in the left atrial midline and is not in proximity to any of the pulmonary vein ostia.   4. No PFO/ASD.   5. Normal coronary origin. Right dominance.   6.  CAC score of 3789 which is 92nd percentile for age-, race-, and sex-matched controls.   7.  Aortic atherosclerosis.  8. No acute noncardiac abnormality identified.  Left heart cath and coronary angiography January 2025 1. Nonobstructive CAD 2. Normal LV function 3. Normal LVEDP  Risk Assessment/Calculations:   Click Here to Calculate/Change CHADS2VASc Score The patient's CHADS2-VASc score is 3, indicating a 3.2% annual risk of stroke.   CHF History: No HTN History: No Diabetes History: No Stroke History: No Vascular Disease History: Yes   Labs:    Latest Ref Rng & Units 03/22/2024    2:30 PM 02/12/2024    8:28 AM 11/25/2023    8:38 AM  CBC  WBC 3.4 - 10.8 x10E3/uL 8.2  4.8  5.6   Hemoglobin 13.0 - 17.7 g/dL 87.2  88.4  88.7   Hematocrit 37.5 - 51.0 % 39.1  36.1  35.2   Platelets 150 - 450 x10E3/uL 256  278  391        Latest Ref Rng & Units 03/22/2024    2:30 PM 02/12/2024    8:28 AM 11/25/2023    8:38 AM  BMP  Glucose 70 - 99 mg/dL 899  888  826   BUN 8 - 27 mg/dL 14  14  16    Creatinine 0.76 - 1.27 mg/dL 9.03  9.19  9.27   BUN/Creat Ratio 10 - 24 15  18     Sodium 134 - 144 mmol/L 139  137  136   Potassium 3.5 - 5.2 mmol/L 4.7  4.2  3.8   Chloride 96 - 106 mmol/L 101  101  100   CO2 20 - 29 mmol/L 21  21  25    Calcium  8.6 - 10.2 mg/dL 9.6  9.5  9.6       Latest Ref Rng & Units 03/22/2024    2:30 PM 02/12/2024    8:28 AM 11/25/2023    8:38 AM  CMP  Glucose 70 - 99 mg/dL 899  888  826   BUN 8 - 27 mg/dL 14  14  16    Creatinine 0.76 - 1.27 mg/dL 9.03  9.19  9.27   Sodium 134 - 144 mmol/L 139  137  136   Potassium 3.5 - 5.2 mmol/L 4.7  4.2  3.8   Chloride 96 - 106 mmol/L 101  101  100   CO2 20 - 29 mmol/L 21  21  25    Calcium  8.6 - 10.2 mg/dL 9.6  9.5  9.6     Lab Results  Component Value Date   CHOL 134 11/15/2023   HDL 54 11/15/2023   LDLCALC 61 11/15/2023   LDLDIRECT 145 (H) 09/15/2023   TRIG 100 11/15/2023   CHOLHDL 2.5 11/15/2023   No results for  input(s): LIPOA in the last 8760 hours. No components found for: NTPROBNP No results for input(s): PROBNP in the last 8760 hours. No results for input(s): TSH in the last 8760 hours.  Physical Exam:    Today's Vitals   03/28/24 0923  BP: 110/68  Pulse: 94  Resp: 16  SpO2: 97%  Weight: 174 lb 6.4 oz (79.1 kg)  Height: 5' 9 (1.753 m)    Body mass index is 25.75 kg/m. Wt Readings from Last 3 Encounters:  03/28/24 174 lb 6.4 oz (79.1 kg)  03/07/24 174 lb 6.4 oz (79.1 kg)  02/01/24 175 lb 3.2 oz (79.5 kg)    Physical Exam  Constitutional: No distress.  hemodynamically stable  Neck: No JVD present.  Cardiovascular: Normal rate, S1 normal and S2 normal. An irregularly irregular rhythm present. Exam reveals no gallop, no S3 and no S4.  No murmur heard. Pulmonary/Chest: Effort normal and breath sounds normal. No stridor. He has no wheezes. He has no rales.  Abdominal: Soft. Bowel sounds are normal. He exhibits no distension. There is no abdominal tenderness.  Musculoskeletal:        General: No edema.     Cervical back: Neck supple.  Neurological: He is alert and oriented to person, place, and time. He has intact cranial nerves (2-12).  Skin: Skin is warm.   Impression & Recommendation(s):  Impression:   ICD-10-CM   1. Persistent atrial fibrillation (HCC)  I48.19 EKG 12-Lead  apixaban  (ELIQUIS ) 5 MG TABS tablet    2. Secondary hypercoagulable state (HCC)  D68.69     3. S/P ablation of atrial fibrillation  Z98.890    Z86.79     4. Nonobstructive atherosclerosis of coronary artery  I25.10     5. Aortic atherosclerosis (HCC)  I70.0     6. Agatston CAC score, >400  R93.1     7. Pure hypercholesterolemia  E78.00     8. Aortic root dilatation (HCC)  I77.810         Recommendation(s):  Persistent atrial fibrillation (HCC) S/P ablation of atrial fibrillation History of atrial fibrillation ablation  However, he has had symptomatic persistent atrial  fibrillation since March 2025 Rate control: Cardizem . Rhythm control: N/A. Thromboembolic prophylaxis: Eliquis  Atrial fibrillation burden approximately 9% based on the cardiac monitor from April 2025. Has been evaluated by atrial fibrillation clinic as well as electrophysiology and is scheduled for a atrial fibrillation ablation tomorrow 03/29/2024. Pulmonary vein study from March 11, 2024 reviewed.   Patient has been compliant with his anticoagulation without interruption. Will refill Eliquis . Patient understands that long-term use of anticoagulation will be needed post ablation given his chads Vascor; however, he can discuss this further with the EP.  Nonobstructive atherosclerosis of coronary artery Aortic atherosclerosis (HCC) Agatston CAC score, >400 Total CAC is 3221 -consistent with severe CAC.  He at least has moderate CAD based on coronary CTA in the LAD and RCA distribution. Heart catheterization: nonobstructive CAD. No anginal chest pain since last office visit.   LDL levels initially were 853 mg/dL and given the severe CAC patient was agreeable to start statin therapy. Recent labs 11/15/2023 independently reviewed and LDL levels have improved to 61 mg/dL on current therapy. Monitor for now.  Pure hypercholesterolemia Prior LDL levels 146 mg/dL. Follow-up lipids have noted LDL to be 61 mg/dL. Continue Crestor  20 mg p.o. nightly  Aortic root dilatation: Noted incidentally on echocardiogram 09/11/2023. Aortic root was measured to be 41 mm and proximal ascending aorta at 39 mm. Recommend follow-up echocardiogram in 1 year, December 2025 to reevaluate disease progression. Patient is aware of keeping a log of his blood pressures to make sure further medication titration is not warranted. If medications are warranted recommend ARB.  Orders Placed:  Orders Placed This Encounter  Procedures   EKG 12-Lead    Final Medication List:    Meds ordered this encounter  Medications    apixaban  (ELIQUIS ) 5 MG TABS tablet    Sig: Take 1 tablet (5 mg total) by mouth 2 (two) times daily.    Dispense:  60 tablet    Refill:  6    Medications Discontinued During This Encounter  Medication Reason   apixaban  (ELIQUIS ) 5 MG TABS tablet Reorder       Current Outpatient Medications:    acetaminophen  (TYLENOL ) 325 MG tablet, Take 650 mg by mouth every 6 (six) hours as needed for mild pain (pain score 1-3), moderate pain (pain score 4-6) or headache., Disp: , Rfl:    diltiazem  (CARDIZEM  CD) 300 MG 24 hr capsule, TAKE 1 CAPSULE(300 MG) BY MOUTH DAILY, Disp: 90 capsule, Rfl: 3   GEMTESA 75 MG TABS, Take 75 mg by mouth daily., Disp: , Rfl:    Prenatal Vit-Fe Fumarate-FA (WESTAB PLUS) 27-1 MG TABS, Take 1 tablet by mouth daily., Disp: , Rfl:    rosuvastatin  (CRESTOR ) 20 MG tablet, Take 1 tablet (20 mg total) by mouth at bedtime., Disp: 90 tablet, Rfl: 3  trospium (SANCTURA) 20 MG tablet, Take 20 mg by mouth 2 (two) times daily., Disp: , Rfl:    apixaban  (ELIQUIS ) 5 MG TABS tablet, Take 1 tablet (5 mg total) by mouth 2 (two) times daily., Disp: 60 tablet, Rfl: 6   diltiazem  (CARDIZEM ) 60 MG tablet, Take 1 tablet (60 mg total) by mouth 4 (four) times daily as needed for up to 30 doses. For heart rate above 100 beats per minute persistently while at rest (Patient not taking: Reported on 03/28/2024), Disp: 30 tablet, Rfl: 0  Current Facility-Administered Medications:    0.9 %  sodium chloride  infusion, 500 mL, Intravenous, Continuous, Abran Norleen SAILOR, MD  Consent:    NA   Disposition:   Follow-up in January 2026.  Sooner if needed.   Signed, Madonna Michele HAS, [facility]  A Division of [facility] [address] [place], [place] [address]  [place], [place] [address]  1:12 PM 03/28/24 2

## 2024-03-28 NOTE — Pre-Procedure Instructions (Signed)
 Instructed patient on the following items: Arrival time 1000 Nothing to eat or drink after midnight No meds AM of procedure Responsible person to drive you home and stay with you for 24 hrs Wash with special soap night before and morning of procedure If on anti-coagulant drug instructions Eliquis - takes twice a day, hasn't missed any doses.  Don't take morning of procedure.

## 2024-03-29 ENCOUNTER — Encounter (HOSPITAL_COMMUNITY): Payer: Self-pay

## 2024-03-29 ENCOUNTER — Ambulatory Visit (HOSPITAL_COMMUNITY): Payer: Self-pay

## 2024-03-29 ENCOUNTER — Encounter (HOSPITAL_COMMUNITY)
Admission: RE | Disposition: A | Discharge status: Home / Self Care | Payer: Self-pay | Source: Home / Self Care | Attending: Cardiology

## 2024-03-29 ENCOUNTER — Other Ambulatory Visit: Payer: Self-pay

## 2024-03-29 ENCOUNTER — Ambulatory Visit (HOSPITAL_COMMUNITY)
Admission: RE | Admit: 2024-03-29 | Discharge: 2024-03-29 | Disposition: A | Discharge status: Home / Self Care | Attending: Cardiology | Admitting: Cardiology

## 2024-03-29 DIAGNOSIS — I4819 Other persistent atrial fibrillation: Secondary | ICD-10-CM

## 2024-03-29 DIAGNOSIS — I4891 Unspecified atrial fibrillation: Secondary | ICD-10-CM

## 2024-03-29 DIAGNOSIS — M1990 Unspecified osteoarthritis, unspecified site: Secondary | ICD-10-CM

## 2024-03-29 DIAGNOSIS — I2510 Atherosclerotic heart disease of native coronary artery without angina pectoris: Secondary | ICD-10-CM | POA: Insufficient documentation

## 2024-03-29 DIAGNOSIS — I483 Typical atrial flutter: Secondary | ICD-10-CM | POA: Diagnosis not present

## 2024-03-29 DIAGNOSIS — Z8546 Personal history of malignant neoplasm of prostate: Secondary | ICD-10-CM | POA: Diagnosis not present

## 2024-03-29 DIAGNOSIS — I700 Atherosclerosis of aorta: Secondary | ICD-10-CM | POA: Diagnosis not present

## 2024-03-29 DIAGNOSIS — E7800 Pure hypercholesterolemia, unspecified: Secondary | ICD-10-CM | POA: Insufficient documentation

## 2024-03-29 DIAGNOSIS — Z7901 Long term (current) use of anticoagulants: Secondary | ICD-10-CM | POA: Insufficient documentation

## 2024-03-29 DIAGNOSIS — Z79899 Other long term (current) drug therapy: Secondary | ICD-10-CM | POA: Insufficient documentation

## 2024-03-29 HISTORY — PX: ATRIAL FIBRILLATION ABLATION: EP1191

## 2024-03-29 SURGERY — ATRIAL FIBRILLATION ABLATION
Anesthesia: General

## 2024-03-29 MED ORDER — LIDOCAINE 2% (20 MG/ML) 5 ML SYRINGE
INTRAMUSCULAR | Status: DC | PRN
  Administered 2024-03-29: 60 mg via INTRAVENOUS

## 2024-03-29 MED ORDER — CEFAZOLIN SODIUM-DEXTROSE 2-4 GM/100ML-% IV SOLN
INTRAVENOUS | Status: AC
  Filled 2024-03-29: qty 100

## 2024-03-29 MED ORDER — ROCURONIUM BROMIDE 10 MG/ML (PF) SYRINGE
PREFILLED_SYRINGE | INTRAVENOUS | Status: DC | PRN
  Administered 2024-03-29: 50 mg via INTRAVENOUS

## 2024-03-29 MED ORDER — SUGAMMADEX SODIUM 200 MG/2ML IV SOLN
INTRAVENOUS | Status: DC | PRN
  Administered 2024-03-29: 200 mg via INTRAVENOUS
  Administered 2024-03-29: 100 mg via INTRAVENOUS

## 2024-03-29 MED ORDER — SODIUM CHLORIDE 0.9% FLUSH: 3.0000 mL | Freq: Two times a day (BID) | INTRAVENOUS | Status: DC

## 2024-03-29 MED ORDER — CEFAZOLIN SODIUM-DEXTROSE 2-3 GM-%(50ML) IV SOLR
INTRAVENOUS | Status: DC | PRN
  Administered 2024-03-29: 2 g via INTRAVENOUS

## 2024-03-29 MED ORDER — FENTANYL CITRATE (PF) 250 MCG/5ML IJ SOLN
INTRAMUSCULAR | Status: DC | PRN
  Administered 2024-03-29: 25 ug via INTRAVENOUS
  Administered 2024-03-29: 75 ug via INTRAVENOUS

## 2024-03-29 MED ORDER — SODIUM CHLORIDE 0.9% FLUSH
3.0000 mL | INTRAVENOUS | Status: DC | PRN
Start: 2024-03-29 — End: 2024-03-30

## 2024-03-29 MED ORDER — ONDANSETRON HCL 4 MG/2ML IJ SOLN: 4.0000 mg | Freq: Four times a day (QID) | INTRAMUSCULAR | Status: DC | PRN

## 2024-03-29 MED ORDER — SODIUM CHLORIDE 0.9 % IV SOLN: INTRAVENOUS | Status: DC

## 2024-03-29 MED ORDER — SODIUM CHLORIDE 0.9 % IV SOLN: 250.0000 mL | INTRAVENOUS | Status: DC | PRN

## 2024-03-29 MED ORDER — ACETAMINOPHEN 325 MG PO TABS: 650.0000 mg | ORAL_TABLET | ORAL | Status: DC | PRN

## 2024-03-29 MED ORDER — ONDANSETRON HCL 4 MG/2ML IJ SOLN
INTRAMUSCULAR | Status: DC | PRN
  Administered 2024-03-29: 4 mg via INTRAVENOUS

## 2024-03-29 MED ORDER — DEXAMETHASONE SODIUM PHOSPHATE 10 MG/ML IJ SOLN
INTRAMUSCULAR | Status: DC | PRN
  Administered 2024-03-29: 10 mg via INTRAVENOUS

## 2024-03-29 MED ORDER — HEPARIN (PORCINE) IN NACL 1000-0.9 UT/500ML-% IV SOLN
INTRAVENOUS | Status: DC | PRN
  Administered 2024-03-29 (×3): 500 mL

## 2024-03-29 MED ORDER — FENTANYL CITRATE (PF) 100 MCG/2ML IJ SOLN
INTRAMUSCULAR | Status: AC
  Filled 2024-03-29: qty 2

## 2024-03-29 MED ORDER — PHENYLEPHRINE HCL-NACL 20-0.9 MG/250ML-% IV SOLN
INTRAVENOUS | Status: DC | PRN
  Administered 2024-03-29: 25 ug/min via INTRAVENOUS

## 2024-03-29 MED ORDER — PROPOFOL 10 MG/ML IV BOLUS
INTRAVENOUS | Status: DC | PRN
  Administered 2024-03-29: 100 mg via INTRAVENOUS

## 2024-03-29 MED ORDER — PHENYLEPHRINE 80 MCG/ML (10ML) SYRINGE FOR IV PUSH (FOR BLOOD PRESSURE SUPPORT)
PREFILLED_SYRINGE | INTRAVENOUS | Status: DC | PRN
  Administered 2024-03-29: 160 ug via INTRAVENOUS

## 2024-03-29 SURGICAL SUPPLY — 18 items
BAG SNAP BAND KOVER 36X36 (MISCELLANEOUS) IMPLANT
CATH GE 8FR SOUNDSTAR (CATHETERS) IMPLANT
CATH OCTARAY 2.0 F 3-3-3-3-3 (CATHETERS) IMPLANT
CATH WEBSTER BI DIR CS D-F CRV (CATHETERS) IMPLANT
CLOSURE PERCLOSE PROSTYLE (VASCULAR PRODUCTS) IMPLANT
COVER SWIFTLINK CONNECTOR (BAG) ×1 IMPLANT
DILATOR VESSEL 38 20CM 16FR (INTRODUCER) IMPLANT
GUIDEWIRE INQWIRE 1.5J.035X260 (WIRE) IMPLANT
GUIDEWIRE SAFE TJ AMPLATZ EXST (WIRE) IMPLANT
KIT VERSACROSS CNCT FARADRIVE (KITS) IMPLANT
PACK EP LF (CUSTOM PROCEDURE TRAY) ×1 IMPLANT
PAD DEFIB RADIO PHYSIO CONN (PAD) ×1 IMPLANT
PATCH CARTO3 (PAD) IMPLANT
SHEATH FARADRIVE STEERABLE (SHEATH) IMPLANT
SHEATH INTRO CHECKFLO 16F 13 (SHEATH) IMPLANT
SHEATH PINNACLE 8F 10CM (SHEATH) IMPLANT
SHEATH PINNACLE 9F 10CM (SHEATH) IMPLANT
SHEATH PROBE COVER 6X72 (BAG) IMPLANT

## 2024-03-29 NOTE — Progress Notes (Signed)
 Patient walked to the bathroom without difficulties. Bilateral groins level 0, clean, dry, and intact.

## 2024-03-29 NOTE — Transfer of Care (Signed)
 Immediate Anesthesia Transfer of Care Note  Patient: [name] [name]  Procedure(s) Performed: ATRIAL FIBRILLATION ABLATION  Patient Location: PACU  Anesthesia Type:General  Level of Consciousness: awake, alert , and oriented  Airway & Oxygen Therapy: Patient Spontanous Breathing and Patient connected to face mask oxygen  Post-op Assessment: Report given to RN and Post -op Vital signs reviewed and stable  Post vital signs: Reviewed and stable  Last Vitals:  Vitals Value Taken Time  BP 113/96 03/29/24 14:52  Temp    Pulse 84 03/29/24 14:53  Resp 16 03/29/24 14:53  SpO2 99 % 03/29/24 14:53  Vitals shown include unfiled device data.  Last Pain:  Vitals:   03/29/24 1037  TempSrc: Oral         Complications: No notable events documented.

## 2024-03-29 NOTE — Discharge Instructions (Signed)

## 2024-03-29 NOTE — Anesthesia Procedure Notes (Signed)
 Procedure Name: Intubation Date/Time: 03/29/2024 1:21 PM  Performed by: Roslynn Waddell LABOR, CRNAPre-anesthesia Checklist: Patient identified, Emergency Drugs available, Suction available and Patient being monitored Patient Re-evaluated:Patient Re-evaluated prior to induction Oxygen Delivery Method: Circle System Utilized Preoxygenation: Pre-oxygenation with 100% oxygen Induction Type: IV induction Ventilation: Mask ventilation without difficulty Laryngoscope Size: Glidescope and 3 Grade View: Grade I Tube type: Oral Tube size: 6.5 mm Number of attempts: 1 Airway Equipment and Method: Stylet and Oral airway Placement Confirmation: ETT inserted through vocal cords under direct vision, positive ETCO2 and breath sounds checked- equal and bilateral Secured at: 22 cm Tube secured with: Tape Dental Injury: Teeth and Oropharynx as per pre-operative assessment  Comments: Patient with baseline L phrenic and L RLN injury. GS used from beginning with 6.5 ETT to help reduce risk for injury/aggravation of baseline injury. Gentle VL and easily passed 6.5 ETT through glottis. EBBS. Dentition and oral mucosa undamaged and as per preop.

## 2024-03-29 NOTE — H&P (Signed)
 Electrophysiology Office Note:     Date:  03/29/2024    ID:  [name] [name], DOB 1940/10/12, MRN [id number]   [facility] Cardiologist:  [name] Large, DO  [facility] Electrophysiologist:  [name] ONEIDA HOLTS, MD    Referring MD: [name] Fairy PARAS, PA-C    Chief Complaint: Atrial fibrillation   History of Present Illness:     [name] [name] is an 83 year old man who I am seeing today for an evaluation of atrial fibrillation at the request of [name] [name].  The patient last saw [name] in the atrial fibrillation clinic on Jan 16, 2024.  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  He has been having symptomatic atrial fibrillation and flutter.  He has been treated with diltiazem  in the past.  He presents today to discuss catheter ablation.   He had a prior catheter ablation with Dr [name] [name] at [facility] > 5 years ago.   He is with his wife today in clinic.  He is an active man.  He does couples golf with his wife.  He tells me that he can certainly feel that he is out of rhythm.  He reports GI and urinary symptoms that are a nuisance since being out of rhythm.  The A-fib recurred 3 months ago around the time of his left knee replacement.  He also an episode in 2016 at the time of a thymoma resection.  Presents for AF ablation. Procedure reviewed.   Objective Their past medical, social and family history was reviewed.     ROS:   Please see the history of present illness.    All other systems reviewed and are negative.   EKGs/Labs/Other Studies Reviewed:     The following studies were reviewed today:   January 07, 2024 ZIO monitor personally reviewed Heart rates 53-1 78, average 75 9% burden of atrial fibrillation Less than 1% PVC burden   Jan 16, 2024 EKG shows atrial flutter with 4-1 AV conduction.  Typical appearing.    December 27, 2023 EKG shows sinus rhythm.  QTc is 440 ms.  Narrow QRS.   September 11, 2023 echo EF 55% RV normal No significant valvular abnormalities   September 29, 2023 left heart catheterization Nonobstructive coronary artery disease EKG Interpretation Date/Time:                  Thursday Feb 01 2024 09:49:50 EDT Ventricular Rate:         84 PR Interval:                   QRS Duration:             82 QT Interval:                 366 QTC Calculation:432 R Axis:                         26   Text Interpretation:Atrial fibrillation Nonspecific ST and T wave abnormality When compared with ECG of 16-Jan-2024 10:59, Confirmed by [name] [name] [id number]) on 02/01/2024 9:58:08 AM      Physical Exam:     VS:  BP 125/85   Pulse 111   Ht 5' 9 (1.753 m)   Wt 175 lb 3.2 oz (79.5 kg)   SpO2 97%   BMI 25.87 kg/m  Wt Readings from Last 3 Encounters:  02/01/24 175 lb 3.2 oz (79.5 kg)  01/16/24 174 lb 12.8 oz (79.3 kg)  12/27/23 175 lb (79.4 kg)      GEN: no distress CARD: Irregularly irregular, No MRG RESP: No IWOB. CTAB.         Assessment ASSESSMENT AND PLAN:     1. Paroxysmal atrial fibrillation (HCC)   2. Typical atrial flutter (HCC)       #Atrial fibrillation and flutter Symptomatic. Discussed treatment options today for his atrial fibrillation flutter including antiarrhythmic drugs, conservative therapy and catheter ablation.  He would like to avoid exposure to antiarrhythmic drugs which is reasonable and he is interested in pursuing catheter ablation.  I discussed the catheter ablation procedure in detail including the risks, recovery and likelihood of success.  He is interested in proceeding.   Discussed treatment options today for AF including antiarrhythmic drug therapy and ablation. Discussed risks, recovery and likelihood of success with each treatment strategy. Risk, benefits, and alternatives to EP study and ablation for afib were discussed. These risks  include but are not limited to stroke, bleeding, vascular damage, tamponade, perforation, damage to the esophagus, lungs, phrenic nerve and other structures, pulmonary vein stenosis, worsening renal function, coronary vasospasm and death.  Discussed potential need for repeat ablation procedures and antiarrhythmic drugs after an initial ablation. The patient understands these risk and wishes to proceed.  We will therefore proceed with catheter ablation at the next available time.  Carto, ICE, anesthesia are requested for the procedure.  Will also obtain CT PV protocol prior to the procedure to exclude LAA thrombus and further evaluate atrial anatomy.   Presents for AF ablation. Procedure reviewed.   Signed, [name] [name]. Cindie, MD, [facility], [facility] 03/29/2024 Electrophysiology [facility]

## 2024-03-30 NOTE — Anesthesia Postprocedure Evaluation (Signed)
 Anesthesia Post Note  Patient: [name] [name]  Procedure(s) Performed: ATRIAL FIBRILLATION ABLATION     Patient location during evaluation: Cath Lab Anesthesia Type: General Level of consciousness: awake and alert Pain management: pain level controlled Vital Signs Assessment: post-procedure vital signs reviewed and stable Respiratory status: spontaneous breathing, nonlabored ventilation, respiratory function stable and patient connected to nasal cannula oxygen Cardiovascular status: blood pressure returned to baseline and stable Postop Assessment: no apparent nausea or vomiting Anesthetic complications: no   No notable events documented.  Last Vitals:    Last Pain:   Pain Goal:                   [name]

## 2024-04-01 ENCOUNTER — Encounter (HOSPITAL_COMMUNITY): Payer: Self-pay | Admitting: Cardiology

## 2024-04-01 MED FILL — Fentanyl Citrate Preservative Free (PF) Inj 100 MCG/2ML: INTRAMUSCULAR | Qty: 2 | Status: AC

## 2024-04-01 MED FILL — Cefazolin Sodium-Dextrose IV Solution 2 GM/100ML-4%: INTRAVENOUS | Qty: 100 | Status: AC

## 2024-04-04 ENCOUNTER — Encounter: Payer: Self-pay | Admitting: Emergency Medicine

## 2024-04-07 ENCOUNTER — Ambulatory Visit: Payer: Self-pay | Admitting: Cardiology

## 2024-04-15 ENCOUNTER — Other Ambulatory Visit: Payer: Self-pay | Admitting: Thoracic Surgery (Cardiothoracic Vascular Surgery)

## 2024-04-15 DIAGNOSIS — Z9089 Acquired absence of other organs: Secondary | ICD-10-CM

## 2024-04-16 ENCOUNTER — Ambulatory Visit (HOSPITAL_COMMUNITY)
Admission: RE | Admit: 2024-04-16 | Discharge: 2024-04-16 | Disposition: A | Discharge status: Home / Self Care | Source: Ambulatory Visit | Attending: Cardiology | Admitting: Cardiology

## 2024-04-16 ENCOUNTER — Ambulatory Visit: Admitting: Thoracic Surgery (Cardiothoracic Vascular Surgery)

## 2024-04-16 ENCOUNTER — Ambulatory Visit

## 2024-04-16 VITALS — BP 107/58 | HR 83 | Resp 20 | Ht 69.0 in | Wt 175.0 lb

## 2024-04-16 DIAGNOSIS — Z9089 Acquired absence of other organs: Secondary | ICD-10-CM | POA: Diagnosis present

## 2024-04-16 NOTE — Patient Instructions (Signed)
-  Follow up as needed with clinic

## 2024-04-16 NOTE — Progress Notes (Addendum)
 [address] [place] [address]             [phone]      HPI: [name] is an 83 year old male with medical history of atrial fibrillation, cystic thymoma, osteoarthritis, and prostate cancer returns for routine follow-up having undergone minimally invasive thymectomy via a left VATS in April 2016.  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  [name] [name] reports that he is doing well.  He underwent knee surgery earlier this year and has had some complications since then.  Postsurgery he has had atrial fibrillation and attempted to have an ablation in 03/2024.  Ablation was unable to be performed due to the catheter not being up to fit through the vessels.  He attempts to stay active but finds that he gets fatigued quickly.  He denies chest pain, chest tightness, lower leg swelling.    Current Outpatient Medications  Medication Sig Dispense Refill   acetaminophen  (TYLENOL ) 325 MG tablet Take 650 mg by mouth every 6 (six) hours as needed for mild pain (pain score 1-3), moderate pain (pain score 4-6) or headache.     apixaban  (ELIQUIS ) 5 MG TABS tablet Take 1 tablet (5 mg total) by mouth 2 (two) times daily. 60 tablet 6   diltiazem  (CARDIZEM  CD) 300 MG 24 hr capsule TAKE 1 CAPSULE(300 MG) BY MOUTH DAILY 90 capsule 3   diltiazem  (CARDIZEM ) 60 MG tablet Take 1 tablet (60 mg total) by mouth 4 (four) times daily as needed for up to 30 doses. For heart rate above 100 beats per minute persistently while at rest (Patient not taking: No sig reported) 30 tablet 0   GEMTESA 75 MG TABS Take 75 mg by mouth daily.     Prenatal Vit-Fe Fumarate-FA (WESTAB PLUS) 27-1 MG TABS Take 1 tablet by mouth daily.     rosuvastatin  (CRESTOR ) 20 MG tablet Take 1 tablet (20 mg total) by mouth at bedtime. 90 tablet 3   trospium (SANCTURA) 20 MG tablet Take 20 mg by mouth 2 (two) times daily.     Current  Facility-Administered Medications  Medication Dose Route Frequency Provider Last Rate Last Admin   0.9 %  sodium chloride  infusion  500 mL Intravenous Continuous Abran Norleen SAILOR, MD       Vitals:   04/16/24 1423  BP: (!) 107/58  Pulse: 83  Resp: 20  SpO2: 97%   Review of Systems  Respiratory:  Negative for cough and wheezing.   Cardiovascular: Negative.  Negative for chest pain and leg swelling.    Physical Exam: Physical Exam Constitutional:      Appearance: Normal appearance.  HENT:     Head: Normocephalic and atraumatic.  Cardiovascular:     Rate and Rhythm: Rhythm irregularly irregular.     Heart sounds: S1 normal and S2 normal.  Pulmonary:     Effort: Pulmonary effort is normal.     Breath sounds: Normal breath sounds.  Skin:    General: Skin is warm and dry.  Neurological:     General: No focal deficit present.     Mental Status: He is alert and oriented to person, place, and time.      Diagnostic Tests: CLINICAL DATA:  Interstitial lung disease, history of thymic mass removal, history of prostate cancer status post radiation therapy   EXAM: CT CHEST WITHOUT CONTRAST   TECHNIQUE: Multidetector CT imaging  of the chest was performed following the standard protocol without intravenous contrast. High resolution imaging of the lungs, as well as inspiratory and expiratory imaging, was performed.   RADIATION DOSE REDUCTION: This exam was performed according to the departmental dose-optimization program which includes automated exposure control, adjustment of the mA and/or kV according to patient size and/or use of iterative reconstruction technique.   COMPARISON:  Chest CT December 22, 2015, PET-CT March 01, 2021 elevation of right hemidiaphragm   FINDINGS: Cardiovascular: The heart size is normal. Severe atherosclerotic calcifications of coronary arteries.   Mediastinum/Nodes: Few subcentimeter multi station mediastinal lymph nodes likely reactive.  Postsurgical changes in anterior mediastinum from prior thymic mass removal. No mediastinal mass identified.   Lungs/Pleura: Elevation of right hemidiaphragm with subjacent subsegmental scarring, traction bronchiectasis and bronchial wall thickening involving the posteromedial aspect of the right lower lobe with associated peri osteophyte fibrosis likely sequela from chronic infection/inflammation.   Calcified micro nodules in right upper lobe suggestive of prior granulomatous disease. No suspicious findings to suggest interstitial lung disease, honeycombing or air trapping. No pleural effusion.   Upper Abdomen: Cholelithiasis. Right kidney cortical cyst partially visualized. (No imaging follow-up required). Elevation of right hemidiaphragm.   Musculoskeletal: No suspicious osseous lesion to suggest osseous metastasis. Mild bilateral gynecomastia.   IMPRESSION: Segmental scarring/fibrotic changes with associated bronchiectasis involving the posteromedial right lower lobe likely post infectious/inflammatory. These findings are similar to prior PET-CT from 2022 and new to 2017.   No suspicious finding to suggest interstitial lung disease.   Post surgical changes is anterior mediastinum. No recurrent thymic mass.   No suspicious finding to suggest distant or nodal metastatic disease (history of prostate cancer).     Electronically Signed   By: Megan  Zare M.D.   On: 03/20/2024 11:24  CLINICAL DATA:  History of thymectomy/resection of anterior mediastinal mass.   EXAM: CHEST - 2 VIEW   COMPARISON:  Radiographs 11/25/2023 and 05/02/2023. Chest CT 03/18/2024.   FINDINGS: The heart size and mediastinal contours are stable with mild aortic tortuosity. There are surgical clips in the anterior mediastinum. Stable scarring at both lung bases. No confluent airspace disease, pleural effusion or pneumothorax. Stable mild degenerative changes in the spine without acute osseous  abnormality.   IMPRESSION: Stable postoperative chest. No evidence of acute cardiopulmonary process.     Electronically Signed   By: Elsie Perone M.D.   On: 04/16/2024 14:49  Plan: Hx of thymectomy - CT scan of chest on 03/20/2024 showed no suspicious findings and no recurrent thymic mass.  Chest x-ray discussed with patient and shows no acute cardiopulmonary process. 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  He reports no change of symptoms over the past couple years.   He has had worked with speech pathology to help regain his voice.  He still notices some shortness of breath when walking upstairs or on an incline. -Follow-up as needed with [facility] clinic    Manuelita CHRISTELLA Rough, PA-C [facility] [phone]

## 2024-04-30 ENCOUNTER — Encounter (HOSPITAL_COMMUNITY): Payer: Self-pay | Admitting: Internal Medicine

## 2024-04-30 ENCOUNTER — Ambulatory Visit (HOSPITAL_COMMUNITY)
Admission: RE | Admit: 2024-04-30 | Discharge: 2024-04-30 | Disposition: A | Discharge status: Home / Self Care | Source: Ambulatory Visit | Attending: Internal Medicine | Admitting: Internal Medicine

## 2024-04-30 VITALS — BP 100/60 | HR 109 | Ht 69.0 in | Wt 173.2 lb

## 2024-04-30 DIAGNOSIS — D6869 Other thrombophilia: Secondary | ICD-10-CM | POA: Diagnosis not present

## 2024-04-30 DIAGNOSIS — I4819 Other persistent atrial fibrillation: Secondary | ICD-10-CM | POA: Diagnosis not present

## 2024-04-30 NOTE — Progress Notes (Addendum)
 Primary Care Physician: Burney Darice CROME, MD Primary Cardiologist: Madonna Large, DO Electrophysiologist: OLE ONEIDA HOLTS, MD     Referring Physician: Dr. Large Harder Albrecht is a 83 y.o. male with a history of CAD, aortic atherosclerosis, aortic root dilatation, hypercholesterolemia, neuromuscular disorder, history of prostate cancer s/p resection, thrombocytopenia, history of thymectomy with left VATS complicated by left phrenic and recurrent nerve dysfunction, and atrial fibrillation who presents for consultation in the [facility]. Diltiazem  recently increased to 240 mg daily due to 9% Afib burden on cardiac monitor. Patient walked into clinic noting he has been in Afib for the past 5 days. Patient is on Eliquis  5 mg BID for a CHADS2VASC score of 3.  On follow up 04/30/24, patient is currently in Afib. S/p attempted ablation on 03/29/24 aborted by Dr. HOLTS due to stenotic right common femoral/iliac vein. Cardioversion was performed with near immediate return to Afib. Patient is on Eliquis  5 mg BID. He is interested in rhythm control to try to restore normal rhythm.  Today, he denies symptoms of  chest pain, shortness of breath, orthopnea, PND, lower extremity edema, dizziness, presyncope, syncope, snoring, daytime somnolence, bleeding, or neurologic sequela. The patient is tolerating medications without difficulties and is otherwise without complaint today.    he has a BMI of Body mass index is 25.58 kg/m.SABRA Filed Weights   04/30/24 1454  Weight: 78.6 kg     Current Outpatient Medications  Medication Sig Dispense Refill   acetaminophen  (TYLENOL ) 325 MG tablet Take 650 mg by mouth every 6 (six) hours as needed for mild pain (pain score 1-3), moderate pain (pain score 4-6) or headache.     apixaban  (ELIQUIS ) 5 MG TABS tablet Take 1 tablet (5 mg total) by mouth 2 (two) times daily. 60 tablet 6   diltiazem  (CARDIZEM  CD) 300 MG 24 hr capsule TAKE 1  CAPSULE(300 MG) BY MOUTH DAILY 90 capsule 3   diltiazem  (CARDIZEM ) 60 MG tablet Take 1 tablet (60 mg total) by mouth 4 (four) times daily as needed for up to 30 doses. For heart rate above 100 beats per minute persistently while at rest 30 tablet 0   GEMTESA 75 MG TABS Take 75 mg by mouth daily.     Prenatal Vit-Fe Fumarate-FA (WESTAB PLUS) 27-1 MG TABS Take 1 tablet by mouth daily.     rosuvastatin  (CRESTOR ) 20 MG tablet Take 1 tablet (20 mg total) by mouth at bedtime. 90 tablet 3   trospium (SANCTURA) 20 MG tablet Take 20 mg by mouth 2 (two) times daily.     Current Facility-Administered Medications  Medication Dose Route Frequency Provider Last Rate Last Admin   0.9 %  sodium chloride  infusion  500 mL Intravenous Continuous Abran Norleen SAILOR, MD        Atrial Fibrillation Management history:  Previous antiarrhythmic drugs: none Previous cardioversions: remote (20 years ago) Previous ablations: remote (20 years ago) Anticoagulation history: Eliquis    ROS- All systems are reviewed and negative except as per the HPI above.  Physical Exam: BP 100/60   Pulse (!) 109   Ht 5' 9 (1.753 m)   Wt 78.6 kg   BMI 25.58 kg/m   GEN- The patient is well appearing, alert and oriented x 3 today.   Neck - no JVD or carotid bruit noted Lungs- Clear to ausculation bilaterally, normal work of breathing Heart- Irregular rate and rhythm, no murmurs, rubs or gallops, PMI not laterally displaced Extremities-  no clubbing, cyanosis, or edema Skin - no rash or ecchymosis noted   EKG today demonstrates  Vent. rate 109 BPM PR interval * ms QRS duration 88 ms QT/QTcB 316/425 ms P-R-T axes * 25 33 Atrial fibrillation with rapid ventricular response Nonspecific ST abnormality Abnormal ECG When compared with ECG of 29-Mar-2024 15:18, No significant change was found  Echo 09/11/23 demonstrated   1. Left ventricular ejection fraction, by estimation, is 55 to 60%. The  left ventricle has normal  function. The left ventricle has no regional  wall motion abnormalities. Left ventricular diastolic function could not  be evaluated.   2. Right ventricular systolic function is normal. The right ventricular  size is normal. There is normal pulmonary artery systolic pressure.   3. The mitral valve is normal in structure. No evidence of mitral valve  regurgitation. No evidence of mitral stenosis.   4. The aortic valve is normal in structure. There is mild calcification  of the aortic valve. Aortic valve regurgitation is not visualized. No  aortic stenosis is present.   5. Aortic dilatation noted. There is dilatation of the aortic root,  measuring 41 mm. There is dilatation of the ascending aorta, measuring 39  mm.   6. The inferior vena cava is normal in size with greater than 50%  respiratory variability, suggesting right atrial pressure of 3 mmHg.    ASSESSMENT & PLAN CHA2DS2-VASc Score = 3  The patient's score is based upon: CHF History: 0 HTN History: 0 Diabetes History: 0 Stroke History: 0 Vascular Disease History: 1 Age Score: 2 Gender Score: 0       ASSESSMENT AND PLAN: Paroxysmal Atrial Fibrillation (ICD10:  I48.0) The patient's CHA2DS2-VASc score is 3, indicating a 3.2% annual risk of stroke.   S/p aborted ablation attempt on 03/29/24 by Dr. Cindie.  He is currently in Afib. We had a discussion about medication treatments and ablation in detail. We discussed potential options such as Multaq, Tikosyn, and amiodarone . We talked about the monitoring required for these medications, hospital admission for Tikosyn, and potential adverse effects. After discussion, patient is considering Multaq as an initial treatment choice. He will contact insurance for pricing and contact clinic. If proceed with Multaq, would schedule repeat cardioversion and have patient start medication several days prior to DCCV.   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  Continue Apixaban  (Eliquis ).  Continue Eliquis  5 mg BID.    Patient will call with treatment decision.   Terra Pac, PA-C  Afib Clinic [facility] [address] [place], [place] [address] [phone]

## 2024-06-12 ENCOUNTER — Ambulatory Visit: Admitting: Nurse Practitioner

## 2024-06-12 ENCOUNTER — Encounter: Payer: Self-pay | Admitting: Nurse Practitioner

## 2024-06-12 VITALS — BP 110/60 | HR 84 | Temp 97.9°F | Ht 69.0 in | Wt 166.4 lb

## 2024-06-12 DIAGNOSIS — K117 Disturbances of salivary secretion: Secondary | ICD-10-CM | POA: Diagnosis not present

## 2024-06-12 DIAGNOSIS — R0609 Other forms of dyspnea: Secondary | ICD-10-CM | POA: Diagnosis not present

## 2024-06-12 DIAGNOSIS — J387 Other diseases of larynx: Secondary | ICD-10-CM | POA: Diagnosis not present

## 2024-06-12 MED ORDER — ALBUTEROL SULFATE HFA 108 (90 BASE) MCG/ACT IN AERS: 2.0000 | INHALATION_SPRAY | Freq: Four times a day (QID) | RESPIRATORY_TRACT | 3 refills | Status: AC | PRN

## 2024-06-12 NOTE — Patient Instructions (Addendum)
 Trial Albuterol inhaler 2 puffs every 6 hours as needed for shortness of breath or wheezing. Notify if symptoms persist despite rescue inhaler/neb use. Use Biotene mouth wash/rinse over the counter for dry mouth   Referral to Ear Nose and Throat for further evaluation of your vocal cords  Talk to your primary care doctor about the sleep study. We discussed today about my concerns surrounding untreated sleep apnea related to your phrenic nerve injury and your persistent a fib. We discussed how untreated sleep apnea puts an individual at risk for cardiac arrhthymias, pulm HTN, DM, stroke and increases their risk for daytime accidents. Let me know if you decide you would like to move forward with a home sleep study for further evaluation   Follow up in 6-8 weeks with [name] [name] or [name] [name]. If symptoms do not improve or worsen, please contact office for sooner follow up or seek emergency care.

## 2024-06-12 NOTE — Progress Notes (Signed)
 "  @Patient  ID: [name] [name], male    DOB: 04/25/1941, 83 y.o.   MRN: [id number]  Chief Complaint  Patient presents with   Follow-up    3 month follow up    Referring provider: Burney Darice CROME, MD  HPI: 83 year old male, never smoker followed for chronic cough, DOE and bronchiectasis. He is a patient of Dr. Reeves and last seen in office 03/07/2024 for consult. He was seen in 2016 by Dr. 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   TEST/EVENTS:  03/07/2024 FeNO 30 ppb  03/18/2024 HRCT chest: severe atherosclerosis/CAD. Elevated right hemidiaphragm with subsegmental scarring, traction btx and bronchial wall thickening in RLL likely sequela of prior infection/inflammation. New from 2017 but stable from 2022. Calcified micro nodules in RUL.   03/07/2024: OV with Dr. Geronimo. New consult. Chronic cough and worsening SOB. Have thymectomy 9 years ago resulting in recurrent laryngeal nerve damage and left phrenic nerve injury that left him chronically hoarse and with left diaphragm paralysis. In February 2025, had left knee surgery. Health declined since. PAF recurred. Fatigue. Functioning at 40% of his baseline in terms of energy and SOB. Knee has fully healed and starting to exercise with some improvement in energy and SOB but cough persists. Dry cough. Tickle in his chest. Possible wheezing. Cough worsens with coughing. FeNO borderline so unable to rule out asthma. Oxygen levels good with sit stand test. HRCT chest. May need SNIFF test and/or PFT.   06/12/2024: Today - follow up Discussed the use of AI scribe software for clinical note transcription with the patient, who gave verbal consent to proceed.  History of Present Illness [name] [name] is  an 83 year old male who presents for follow up.  He has been experiencing shortness of breath and a chronic dry cough since his knee surgery in February. The cough is dry, and the shortness of breath is manageable on level ground but worsens with stair climbing or more strenuous exertion. He has been actively working to regain fitness post-surgery through walking and training but found it challenging.   His atrial fibrillation has been challenging to manage post-surgery as well despite multiple ablations.   In 2016, he had a thymoma removed, during which the phrenic nerve was cut and the laryngeal nerve was injured as well. It is documented that he had vocal cord paralysis following this but didn't have the issues with the cough that he is not experiencing.  He did have some chronic hoarseness but feels it has progressed.   A recent CT scan showed bronchiectasis in the right lower lobe, unchanged from 2022 but new finding compared to imaging from 2017. No sputum production, congestion, hemoptysis, fevers.   He denies reflux or postnasal drainage. He experiences significant dry mouth, making eating difficult and resulting in a 10 lb weight loss over the last 6 months. He has not identified a cause for the dry mouth despite reviewing his medications.  He has a hoarse voice and frequent throat clearing, which he attributes to the lack of saliva. He has not tried treatments for dry mouth, such as Biotene mouthwash.   He's never been evaluated for sleep apnea, despite phrenic nerve injury. He does have daytime fatigue. Some light snoring. No witnessed apneas. He is not interested  in pursuing a sleep study but will discuss with his GP. He has not been tested for sleep apnea but uses a sleep app and reports good sleep quality.    No Known Allergies  Immunization History  Administered Date(s) Administered   INFLUENZA, HIGH DOSE SEASONAL PF 07/05/2017   Influenza Split 07/31/2014   Influenza,  Seasonal, Injecte, Preservative Fre 08/13/2012   Influenza,inj,Quad PF,6+ Mos 07/02/2015   Influenza,inj,quad, With Preservative 06/12/2019   Influenza-Unspecified 06/12/2023   Pneumococcal Conjugate-13 07/08/2014   Pneumococcal Polysaccharide-23 08/15/2013   Pneumococcal-Unspecified 09/13/2003   Tdap 08/15/2013   Zoster Recombinant(Shingrix) 12/29/2017, 03/03/2018   Zoster, Live 09/12/2008    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    Colon polyps    hyperplastic and tubular adenomatous   Coronary atherosclerosis due to calcified coronary lesion    Diverticulosis    Dyspnea    Dysrhythmia    hx at fib- ablation 8 yrs ago   Erectile dysfunction following radical prostatectomy    Hypercapnic respiratory failure, chronic (HCC) 05/01/2015   Mediastinal mass    per CT CHEST 11/07/14 @ [facility].SABRAANTERIOR...4.2 X 3.3 cm soft tissue   Neuromuscular disorder (HCC)    Prostate cancer (HCC)    prostate cancer   Radiation    for prostate cancer   Radiation proctitis    rectum   Recurrent laryngeal nerve palsy    Respiratory failure (HCC)    s/p phrenic nerve palsy    Thrombocytopenia    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 60 tablet 6   diltiazem  (CARDIZEM  CD) 300 MG 24 hr capsule TAKE 1 CAPSULE(300 MG) BY MOUTH DAILY 90 capsule 3   GEMTESA 75 MG TABS Take 75 mg by mouth daily.     rosuvastatin  (CRESTOR ) 20 MG tablet Take 1 tablet (20 mg  total) by mouth at bedtime. 90 tablet 3   trospium (SANCTURA) 20 MG tablet Take 20 mg by mouth 2 (two) times daily.     acetaminophen  (TYLENOL ) 325 MG tablet Take 650 mg by mouth every 6 (six) hours as needed for mild pain (pain score 1-3), moderate pain (pain score 4-6) or headache.     diltiazem  (CARDIZEM ) 60 MG tablet Take 1 tablet (60 mg total) by mouth 4 (four) times daily as needed for up to 30 doses. For heart rate above 100 beats per minute persistently while at rest 30 tablet 0   Prenatal Vit-Fe Fumarate-FA (WESTAB PLUS) 27-1 MG TABS Take 1 tablet by mouth daily.     Facility-Administered Medications Prior to Visit  Medication Dose Route Frequency Provider Last Rate Last Admin   0.9 %  sodium chloride  infusion  500 mL Intravenous Continuous Abran Norleen SAILOR, MD         Review of Systems: As above    Physical Exam:  BP 110/60   Pulse 84   Temp 97.9 F (36.6 C) (Oral)   Ht 5' 9 (  1.753 m)   Wt 166 lb 6.4 oz (75.5 kg)   SpO2 98%   BMI 24.57 kg/m   GEN: Pleasant, interactive, well-appearing; in no acute distress HEENT:  Normocephalic and atraumatic. PERRLA. Sclera white. Nasal turbinates pink, moist and patent bilaterally. No rhinorrhea present. Oropharynx pink and dry, without exudate or edema. No lesions, ulcerations, or postnasal drip. Hoarse voice quality  NECK:  Supple w/ fair ROM. No JVD present. Thyroid  symmetrical with no goiter or nodules palpated. No lymphadenopathy.   CV: Irregular rhythm, rate controlled, no m/r/g, no peripheral edema. Pulses intact, +2 bilaterally. No cyanosis, pallor or clubbing. PULMONARY:  Unlabored, regular breathing. Clear bilaterally A&P w/o wheezes/rales/rhonchi. No accessory muscle use.  GI: BS present and normoactive. Soft, non-tender to palpation MSK: No erythema, warmth or tenderness. Cap refil <2 sec all extrem.  Neuro: A/Ox3. No focal deficits noted.   Skin: Warm, no lesions or rashe Psych: Normal affect and behavior. Judgement and  thought content appropriate.     Lab Results:  CBC    Component Value Date/Time   WBC 8.2 03/22/2024 1430   WBC 5.6 11/25/2023 0838   RBC 4.35 03/22/2024 1430   RBC 3.81 (L) 11/25/2023 0838   HGB 12.7 (L) 03/22/2024 1430   HCT 39.1 03/22/2024 1430   PLT 256 03/22/2024 1430   MCV 90 03/22/2024 1430   MCH 29.2 03/22/2024 1430   MCH 29.4 11/25/2023 0838   MCHC 32.5 03/22/2024 1430   MCHC 31.8 11/25/2023 0838   RDW 14.6 03/22/2024 1430   LYMPHSABS 1.2 08/13/2012 1216   MONOABS 0.8 08/13/2012 1216   EOSABS 0.2 08/13/2012 1216   BASOSABS 0.1 08/13/2012 1216    BMET    Component Value Date/Time   NA 139 03/22/2024 1430   K 4.7 03/22/2024 1430   CL 101 03/22/2024 1430   CO2 21 03/22/2024 1430   GLUCOSE 100 (H) 03/22/2024 1430   GLUCOSE 173 (H) 11/25/2023 0838   GLUCOSE 88 08/07/2006 1317   BUN 14 03/22/2024 1430   CREATININE 0.96 03/22/2024 1430   CREATININE 1.03 08/15/2013 1026   CALCIUM  9.6 03/22/2024 1430   GFRNONAA >60 11/25/2023 0838   GFRAA >90 11/28/2014 0538    BNP No results found for: BNP   Imaging:  No results found.  Administration History     None           No data to display          Lab Results  Component Value Date   NITRICOXIDE 30 03/07/2024        Assessment & Plan:   Assessment & Plan Chronic cough and shortness of breath Chronic cough and shortness of breath with onset 10/2023 post knee surgery with general anesthesia. Shortness of breath exacerbated by exertion, such as climbing stairs. Differential includes airway obstruction and vocal cord dysfunction. Discussed potential benefit of albuterol  inhaler to assess for airway obstruction. Spirometry with mildly moderate obstruction today. Will plan for formal PFTs at follow up. Suspect a component is related to vocal cord dysfunction and upper airway cough.  - Prescribe albuterol  inhaler for use prior to activities that cause shortness of breath - Perform spirometry if  possible today; if not, schedule lung function testing for a separate visit - Refer to Ear, Nose, and Throat specialist for evaluation of vocal cords and larynx  Bronchiectasis of right lower lobe Bronchiectasis of the right lower lobe noted on recent HRCT and stable compared to 2022, which predates onset of symptoms. Symptoms  of chronic cough are atypical for bronchiectasis, as the cough is dry rather than productive and seems to come more so from upper airway irritation/inflammation. Will continue to monitor.   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 No clear etiology identified from current medications. Discussed potential benefit of Biotene mouthwash and sugar-free candies to stimulate saliva production. - Recommend Biotene mouthwash for dry mouth - Suggest using sugar-free hard candies or throat lozenges to stimulate saliva production - Refer to Ear, Nose, and Throat specialist for evaluation of salivary glands  Hoarseness and throat clearing Hoarseness and throat clearing, possibly related to vocal cord damage from intubation during knee surgery. Potential contribution from laryngeal reflux, though no typical acid reflux symptoms reported. ENT evaluation recommended to assess for vocal cord damage and possible reflux. - Refer to Ear, Nose, and Throat specialist for evaluation of vocal cords and larynx  History of thymoma resection with phrenic and recurrent laryngeal nerve injury Thymoma resection with subsequent phrenic and recurrent laryngeal nerve injury. Discussed risk of OSA and potential involvement with fatigue and decreased energy. He also has PAF with difficulties converting to NSR despite multiple ablations and cardioversions. We discussed how untreated sleep apnea puts an individual at risk for cardiac arrhthymias, pulm HTN, DM, stroke and increases their risk for daytime accidents. We also  briefly reviewed treatment options including weight loss, side sleeping position, oral appliance, CPAP therapy or referral to ENT for possible surgical options. Advised HST but he declined for now. Would like to discuss with GP. Safe driving practices reviewed.  - Discuss with primary care physician about the possibility of a home sleep study to evaluate for sleep apnea - Advised to let us  know if he decides to move forward with HST  DOE  Likely multifactorial related to cardiovascular disease, deconditioning, and vocal cord dysfunction. See above plan.    Advised if symptoms do not improve or worsen, to please contact office for sooner follow up or seek emergency care.   I spent 35 minutes of dedicated to the care of this patient on the date of this encounter to include pre-visit review of records, face-to-face time with the patient discussing conditions above, post visit ordering of testing, clinical documentation with the electronic health record, making appropriate referrals as documented, and communicating necessary findings to members of the patients care team.  [name] LULLA Rouleau, NP 06/12/2024  Pt aware and understands NP's role.   "

## 2024-06-14 ENCOUNTER — Encounter: Payer: Self-pay | Admitting: Nurse Practitioner

## 2024-06-18 ENCOUNTER — Telehealth: Payer: Self-pay | Admitting: Internal Medicine

## 2024-06-18 NOTE — Telephone Encounter (Signed)
 Patient called and wanted to say thank you for all of the information and help

## 2024-06-18 NOTE — Telephone Encounter (Signed)
 Rock, Please take a detailed history on my behalf. If there are no worrisome features, I recommend Citrucel 2 tablespoons daily for his gelatinous stools. Thanks, Dr. Abran

## 2024-06-18 NOTE — Telephone Encounter (Signed)
 Noted

## 2024-06-18 NOTE — Telephone Encounter (Signed)
 Hi Good Morning Dr.Perry,  I received a call from [facility] wanting to speak to you in regards to him experiencing gelatinous stools, he believes maybe you can help him speaking with him through the phone instead of waiting for your next availability. Dr.Savona requesting a call back.  Please advise  Thank you

## 2024-06-18 NOTE — Telephone Encounter (Signed)
 Supportive measures. It may have been something he contacted in [place].  Also, COVID may affect the bowels. In addition to Citrucel, can take the probiotic align once daily for 2 weeks.

## 2024-06-18 NOTE — Telephone Encounter (Signed)
 Pt states he was in [place] several weeks ago and he had some intestinal problems for a few days and for 2-3 days his stool was brown mucous. He ate bland food and it went away. When he got home he developed covid on Sunday and today he passed brown mucous stool. He wonders if he may have contracted something while in [place]. He denies seeing any blood in the stool. Please advise.

## 2024-06-18 NOTE — Telephone Encounter (Signed)
Detailed message left for pt on voicemail.

## 2024-06-19 ENCOUNTER — Ambulatory Visit: Admitting: Cardiology

## 2024-06-21 ENCOUNTER — Ambulatory Visit: Admitting: Pulmonary Disease

## 2024-07-01 ENCOUNTER — Institutional Professional Consult (permissible substitution) (INDEPENDENT_AMBULATORY_CARE_PROVIDER_SITE_OTHER): Admitting: Otolaryngology

## 2024-07-07 NOTE — Progress Notes (Unsigned)
 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  Since last being seen in our clinic the patient reports he had an ablation ~ 18 years ago and it held until he had knee surgery in 10/2023. He was very disappointed that he could not have the procedure completed. His asks if there are other techniques for ablation on the horizon.  He states he would prefer to have another ablation if possible over meds. He feels he needs to continue rehab efforts for a while while he considers his drug options.   He denies chest pain, palpitations, dyspnea, PND, orthopnea, nausea, vomiting, dizziness, syncope, edema, weight gain, or early satiety.    Review of systems complete and found to be negative unless listed in HPI.   EP Information / Studies Reviewed:    EKG is ordered today. Personal review as below.  EKG Interpretation Date/Time:  Tuesday July 09 2024 09:07:46 EDT Ventricular Rate:  87 PR Interval:    QRS Duration:  90 QT Interval:  398 QTC Calculation: 478 R Axis:   17  Text Interpretation: Atrial fibrillation Nonspecific T wave abnormality Confirmed by Aniceto Jarvis ([id number]) on 07/09/2024 9:27:16 AM    Arrhythmia / AAD / Pertinent EP Studies AF  AFL  EPS ~2007 > PVI ablation at [facility] with Dr. Epifanio  Cardiac Monitor 12/2023 > HR 53-178 bpm with ave 75 bpm, 9% AF burden, <1% PVC's EPS 03/29/24 > aborted PVI due to stenotic R common femoral / iliac vein, severely dilated LA, cardioversion to SR with near immediate  return to AF.  No further invasive therapy recommended / medical therapy only    Risk Assessment/Calculations:    CHA2DS2-VASc Score = 3   This indicates a 3.2% annual risk of stroke. The patient's score is based upon: CHF History: 0 HTN History: 0 Diabetes History: 0 Stroke History: 0 Vascular Disease History: 1 Age Score: 2 Gender Score: 0             Physical Exam:   VS:  BP 122/70 (BP Location: Left Arm, Patient Position: Sitting)   Pulse 87   Ht 5' 9 (1.753 m)   Wt 168 lb 9.6 oz (76.5 kg)   SpO2 98%   BMI 24.90 kg/m    Wt Readings from Last 3 Encounters:  07/09/24 168 lb 9.6 oz (76.5 kg)  06/12/24 166 lb 6.4 oz (75.5 kg)  04/30/24 173 lb 3.2 oz (78.6 kg)     GEN: Well nourished, well developed in no acute distress NECK: No JVD; No carotid bruits CARDIAC: Irregularly irregular rate and rhythm, no murmurs, rubs, gallops RESPIRATORY:  Clear to auscultation without rales, wheezing or rhonchi  ABDOMEN: Soft, non-tender, non-distended EXTREMITIES:  No edema; No deformity   ASSESSMENT AND PLAN:    Persistent Atrial Fibrillation  Typical Atrial Flutter CHA2DS2-VASc 3 -EPS in7/2025 aborted due to due to stenotic R common femoral / iliac vein, severely dilated LA > no further invasive therapy recommended > medical therapy only   -OAC for stroke prophylaxis  -no symptom burden post ablation  -continue diltiazem  300  mg daily   -AAD options > CAD precludes Ic agents, Tikosyn review > Cr Cl 63 mL/min based on last labs, 11/2023 SR QTc , could also consider Multaq / amiodarone . Discussed rhythm control options with patient and the longer he remains in AF, the harder it would be to get him back out.  He prefers to wait on starting a medication as he is completing knee rehab and wants to stronger.   Secondary Hypercoagulable State  -continue Eliquis  5mg  BID, dose reviewed and appropriate by wt / Cr   Non-Obs CAD  Aortic Atherosclerosis  CAC Score > 400 -per  Cardiology    Follow up with EP APP in 6 months > plan to transition him to Dr. Almetta for EP   Signed, Daphne Barrack, NP-C, AGACNP-BC [facility] - Electrophysiology  07/09/2024, 9:56 AM

## 2024-07-09 ENCOUNTER — Ambulatory Visit: Attending: Pulmonary Disease | Admitting: Pulmonary Disease

## 2024-07-09 ENCOUNTER — Encounter: Payer: Self-pay | Admitting: Pulmonary Disease

## 2024-07-09 VITALS — BP 122/70 | HR 87 | Ht 69.0 in | Wt 168.6 lb

## 2024-07-09 DIAGNOSIS — I4819 Other persistent atrial fibrillation: Secondary | ICD-10-CM

## 2024-07-09 DIAGNOSIS — I483 Typical atrial flutter: Secondary | ICD-10-CM | POA: Diagnosis not present

## 2024-07-09 DIAGNOSIS — D6869 Other thrombophilia: Secondary | ICD-10-CM

## 2024-07-09 NOTE — Patient Instructions (Signed)
 Medication Instructions:  No medications changes today   Anti-arrhythmics discussed for rhythm control as below.   Amiodarone  - less follow up and monitoring, potential for more side effects over time - labs every 6 months with monitoring of liver and thyroid .  Need annual eye exam and CXR as needed.  Would plan for cardioversion after ~ 1 month on amiodarone .   Tikosyn - requires hospital admit for 3 days, Q3 month visits to clinic for EKG and labs.  Less side effects and generally tolerated well.  Does have a lot of drug-drug interactions.   Dronederone - weaker cousin to amiodarone , well tolerated, less side effects compared to amiodarone , can be very expensive depending on insurance coverage   *If you need a refill on your cardiac medications before your next appointment, please call your pharmacy*  Lab Work: No lab work today If you have labs (blood work) drawn today and your tests are completely normal, you will receive your results only by: MyChart Message (if you have MyChart) OR A paper copy in the mail If you have any lab test that is abnormal or we need to change your treatment, we will call you to review the results.  Testing/Procedures: No testing/procedures were scheduled today  Follow-Up: At [facility], you and your health needs are our priority.  As part of our continuing mission to provide you with exceptional heart care, our providers are all part of one team.  This team includes your primary Cardiologist (physician) and Advanced Practice Providers or APPs (Physician Assistants and Nurse Practitioners) who all work together to provide you with the care you need, when you need it.  Your next appointment:   6 month(s)  Provider:   You may see OLE ONEIDA HOLTS, MD> will plan to transition you to Dr. Almetta or one of the following Advanced Practice Providers on your designated Care Team:    Daphne Barrack, NP    We recommend signing up for the patient  portal called MyChart.  Sign up information is provided on this After Visit Summary.  MyChart is used to connect with patients for Virtual Visits (Telemedicine).  Patients are able to view lab/test results, encounter notes, upcoming appointments, etc.  Non-urgent messages can be sent to your provider as well.   To learn more about what you can do with MyChart, go to [email or web address].

## 2024-07-12 ENCOUNTER — Encounter (INDEPENDENT_AMBULATORY_CARE_PROVIDER_SITE_OTHER): Payer: Self-pay

## 2024-07-23 ENCOUNTER — Telehealth: Payer: Self-pay | Admitting: Cardiology

## 2024-07-23 NOTE — Telephone Encounter (Signed)
 Patient states he refilled his crestor  last week but now cannot find his bottle of crestor . Advised patient to call his insurance and request override for refill due to lost bottle, patient agreed and stated he would call us  if this is unsuccessful.

## 2024-07-23 NOTE — Telephone Encounter (Signed)
 Pt c/o medication issue:  1. Name of Medication: rosuvastatin  (CRESTOR ) 20 MG tablet   2. How are you currently taking this medication (dosage and times per day)? As written   3. Are you having a reaction (difficulty breathing--STAT)? No   4. What is your medication issue? Pt called in stating he accidentally threw this medication away please advise if this be sent over again.

## 2024-08-21 ENCOUNTER — Encounter: Payer: Self-pay | Admitting: Cardiology

## 2024-08-28 ENCOUNTER — Ambulatory Visit (HOSPITAL_COMMUNITY): Admission: RE | Admit: 2024-08-28 | Discharge: 2024-08-28 | Attending: Cardiology | Admitting: Cardiology

## 2024-08-28 DIAGNOSIS — I77810 Thoracic aortic ectasia: Secondary | ICD-10-CM | POA: Insufficient documentation

## 2024-08-28 DIAGNOSIS — I4891 Unspecified atrial fibrillation: Secondary | ICD-10-CM | POA: Diagnosis not present

## 2024-08-28 DIAGNOSIS — R0609 Other forms of dyspnea: Secondary | ICD-10-CM | POA: Insufficient documentation

## 2024-08-28 LAB — ECHOCARDIOGRAM COMPLETE: S' Lateral: 3.9 cm

## 2024-09-09 ENCOUNTER — Ambulatory Visit: Payer: Self-pay | Admitting: Cardiology

## 2024-10-08 ENCOUNTER — Other Ambulatory Visit: Payer: Self-pay

## 2024-10-08 DIAGNOSIS — I77810 Thoracic aortic ectasia: Secondary | ICD-10-CM

## 2024-10-15 ENCOUNTER — Other Ambulatory Visit: Payer: Self-pay | Admitting: Cardiology

## 2024-10-15 DIAGNOSIS — I4819 Other persistent atrial fibrillation: Secondary | ICD-10-CM

## 2024-10-18 ENCOUNTER — Telehealth: Payer: Self-pay | Admitting: Cardiology

## 2024-10-18 NOTE — Telephone Encounter (Signed)
 Pt called in requesting to speak with clinical supervisor about his ablation last year. He stated he would prefer to meet in person, face to face. Informed him that I would include that request in the note and that you would reach out to him via phone about that.

## 2024-10-18 NOTE — Telephone Encounter (Signed)
 LMOVM (DPR) requesting call back. Direct number provided.
